# Patient Record
Sex: Female | Born: 1960
Health system: Southern US, Community
[De-identification: ages and names within clinical notes are randomized; demographics above are authoritative.]

## PROBLEM LIST (undated history)

## (undated) DIAGNOSIS — R1310 Dysphagia, unspecified: Secondary | ICD-10-CM

## (undated) DIAGNOSIS — K5900 Constipation, unspecified: Secondary | ICD-10-CM

## (undated) DIAGNOSIS — F419 Anxiety disorder, unspecified: Secondary | ICD-10-CM

## (undated) DIAGNOSIS — T7840XA Allergy, unspecified, initial encounter: Secondary | ICD-10-CM

## (undated) DIAGNOSIS — K219 Gastro-esophageal reflux disease without esophagitis: Secondary | ICD-10-CM

## (undated) DIAGNOSIS — Z923 Personal history of irradiation: Secondary | ICD-10-CM

## (undated) DIAGNOSIS — R7303 Prediabetes: Secondary | ICD-10-CM

## (undated) DIAGNOSIS — E785 Hyperlipidemia, unspecified: Secondary | ICD-10-CM

## (undated) DIAGNOSIS — C801 Malignant (primary) neoplasm, unspecified: Secondary | ICD-10-CM

## (undated) DIAGNOSIS — J189 Pneumonia, unspecified organism: Secondary | ICD-10-CM

## (undated) DIAGNOSIS — M719 Bursopathy, unspecified: Secondary | ICD-10-CM

## (undated) DIAGNOSIS — M549 Dorsalgia, unspecified: Secondary | ICD-10-CM

## (undated) DIAGNOSIS — I00 Rheumatic fever without heart involvement: Secondary | ICD-10-CM

## (undated) DIAGNOSIS — M1990 Unspecified osteoarthritis, unspecified site: Secondary | ICD-10-CM

## (undated) DIAGNOSIS — G4730 Sleep apnea, unspecified: Secondary | ICD-10-CM

## (undated) DIAGNOSIS — C50919 Malignant neoplasm of unspecified site of unspecified female breast: Secondary | ICD-10-CM

## (undated) DIAGNOSIS — M2550 Pain in unspecified joint: Secondary | ICD-10-CM

## (undated) HISTORY — DX: Dysphagia, unspecified: R13.10

## (undated) HISTORY — DX: Personal history of irradiation: Z92.3

## (undated) HISTORY — DX: Bursopathy, unspecified: M71.9

## (undated) HISTORY — PX: ENDOMETRIAL ABLATION: SHX621

## (undated) HISTORY — DX: Rheumatic fever without heart involvement: I00

## (undated) HISTORY — DX: Pain in unspecified joint: M25.50

## (undated) HISTORY — PX: COLONOSCOPY: SHX174

## (undated) HISTORY — DX: Malignant (primary) neoplasm, unspecified: C80.1

## (undated) HISTORY — DX: Allergy, unspecified, initial encounter: T78.40XA

## (undated) HISTORY — PX: TUBAL LIGATION: SHX77

## (undated) HISTORY — PX: POLYPECTOMY: SHX149

## (undated) HISTORY — DX: Dorsalgia, unspecified: M54.9

## (undated) HISTORY — DX: Unspecified osteoarthritis, unspecified site: M19.90

## (undated) HISTORY — DX: Hyperlipidemia, unspecified: E78.5

## (undated) HISTORY — PX: OTHER SURGICAL HISTORY: SHX169

## (undated) HISTORY — DX: Anxiety disorder, unspecified: F41.9

## (undated) HISTORY — DX: Constipation, unspecified: K59.00

## (undated) HISTORY — DX: Gastro-esophageal reflux disease without esophagitis: K21.9

---

## 1998-10-09 ENCOUNTER — Other Ambulatory Visit: Admission: RE | Admit: 1998-10-09 | Discharge: 1998-10-09 | Payer: Self-pay | Admitting: Obstetrics and Gynecology

## 1998-10-29 ENCOUNTER — Ambulatory Visit (HOSPITAL_COMMUNITY): Admission: RE | Admit: 1998-10-29 | Discharge: 1998-10-29 | Payer: Self-pay | Admitting: Obstetrics and Gynecology

## 1999-12-09 ENCOUNTER — Encounter: Admission: RE | Admit: 1999-12-09 | Discharge: 1999-12-09 | Payer: Self-pay | Admitting: Obstetrics and Gynecology

## 1999-12-09 ENCOUNTER — Encounter: Payer: Self-pay | Admitting: Obstetrics and Gynecology

## 2000-09-26 ENCOUNTER — Other Ambulatory Visit: Admission: RE | Admit: 2000-09-26 | Discharge: 2000-09-26 | Payer: Self-pay | Admitting: Obstetrics and Gynecology

## 2001-04-17 ENCOUNTER — Encounter: Admission: RE | Admit: 2001-04-17 | Discharge: 2001-04-17 | Payer: Self-pay | Admitting: Obstetrics and Gynecology

## 2001-04-17 ENCOUNTER — Encounter: Payer: Self-pay | Admitting: Obstetrics and Gynecology

## 2001-10-26 ENCOUNTER — Other Ambulatory Visit: Admission: RE | Admit: 2001-10-26 | Discharge: 2001-10-26 | Payer: Self-pay | Admitting: Obstetrics and Gynecology

## 2002-06-29 ENCOUNTER — Encounter: Admission: RE | Admit: 2002-06-29 | Discharge: 2002-06-29 | Payer: Self-pay | Admitting: Obstetrics and Gynecology

## 2002-06-29 ENCOUNTER — Encounter: Payer: Self-pay | Admitting: Obstetrics and Gynecology

## 2002-08-16 ENCOUNTER — Encounter: Payer: Self-pay | Admitting: Obstetrics and Gynecology

## 2002-08-16 ENCOUNTER — Encounter: Admission: RE | Admit: 2002-08-16 | Discharge: 2002-08-16 | Payer: Self-pay | Admitting: Obstetrics and Gynecology

## 2002-11-27 ENCOUNTER — Other Ambulatory Visit: Admission: RE | Admit: 2002-11-27 | Discharge: 2002-11-27 | Payer: Self-pay | Admitting: Obstetrics and Gynecology

## 2003-10-21 ENCOUNTER — Encounter: Admission: RE | Admit: 2003-10-21 | Discharge: 2003-10-21 | Payer: Self-pay | Admitting: Obstetrics and Gynecology

## 2003-12-04 ENCOUNTER — Other Ambulatory Visit: Admission: RE | Admit: 2003-12-04 | Discharge: 2003-12-04 | Payer: Self-pay | Admitting: Obstetrics and Gynecology

## 2004-01-31 ENCOUNTER — Encounter: Admission: RE | Admit: 2004-01-31 | Discharge: 2004-01-31 | Payer: Self-pay | Admitting: Obstetrics and Gynecology

## 2004-10-21 ENCOUNTER — Encounter: Admission: RE | Admit: 2004-10-21 | Discharge: 2004-10-21 | Payer: Self-pay | Admitting: Obstetrics and Gynecology

## 2005-03-09 ENCOUNTER — Other Ambulatory Visit: Admission: RE | Admit: 2005-03-09 | Discharge: 2005-03-09 | Payer: Self-pay | Admitting: Obstetrics and Gynecology

## 2005-03-22 ENCOUNTER — Encounter: Admission: RE | Admit: 2005-03-22 | Discharge: 2005-03-22 | Payer: Self-pay | Admitting: Obstetrics and Gynecology

## 2005-12-01 ENCOUNTER — Encounter: Payer: Self-pay | Admitting: Obstetrics and Gynecology

## 2006-03-23 ENCOUNTER — Encounter: Admission: RE | Admit: 2006-03-23 | Discharge: 2006-03-23 | Payer: Self-pay | Admitting: Obstetrics and Gynecology

## 2006-04-08 ENCOUNTER — Encounter: Admission: RE | Admit: 2006-04-08 | Discharge: 2006-04-08 | Payer: Self-pay | Admitting: *Deleted

## 2007-04-21 ENCOUNTER — Encounter: Admission: RE | Admit: 2007-04-21 | Discharge: 2007-04-21 | Payer: Self-pay | Admitting: Obstetrics and Gynecology

## 2007-08-03 HISTORY — PX: CERVICAL SPINE SURGERY: SHX589

## 2007-09-08 ENCOUNTER — Ambulatory Visit (HOSPITAL_COMMUNITY): Admission: RE | Admit: 2007-09-08 | Discharge: 2007-09-09 | Payer: Self-pay | Admitting: Neurological Surgery

## 2007-10-09 ENCOUNTER — Encounter: Admission: RE | Admit: 2007-10-09 | Discharge: 2007-10-09 | Payer: Self-pay | Admitting: Neurological Surgery

## 2007-11-13 ENCOUNTER — Encounter: Admission: RE | Admit: 2007-11-13 | Discharge: 2007-11-13 | Payer: Self-pay | Admitting: Neurological Surgery

## 2008-02-09 ENCOUNTER — Encounter: Admission: RE | Admit: 2008-02-09 | Discharge: 2008-02-09 | Payer: Self-pay | Admitting: Neurological Surgery

## 2008-04-22 ENCOUNTER — Encounter: Admission: RE | Admit: 2008-04-22 | Discharge: 2008-04-22 | Payer: Self-pay | Admitting: Obstetrics and Gynecology

## 2008-09-09 ENCOUNTER — Encounter: Admission: RE | Admit: 2008-09-09 | Discharge: 2008-09-09 | Payer: Self-pay | Admitting: Anesthesiology

## 2009-05-23 ENCOUNTER — Encounter: Admission: RE | Admit: 2009-05-23 | Discharge: 2009-05-23 | Payer: Self-pay | Admitting: Obstetrics and Gynecology

## 2009-09-17 ENCOUNTER — Encounter (INDEPENDENT_AMBULATORY_CARE_PROVIDER_SITE_OTHER): Payer: Self-pay | Admitting: *Deleted

## 2010-05-26 ENCOUNTER — Encounter: Admission: RE | Admit: 2010-05-26 | Discharge: 2010-05-26 | Payer: Self-pay | Admitting: Obstetrics and Gynecology

## 2010-09-03 NOTE — Letter (Signed)
Summary: New Patient letter  [facility]  [address] [place], [place] [address]   Phone: [phone]  Fax: [phone]       09/17/2009 MRN: [id number]  Heather Hayes [address] [place], [place]  [address]  Dear Ms. Sledge,  Welcome to the Gastroenterology Division at [facility].    You are scheduled to see Dr.  Russella Dar on 10-15-09 at 2:30p.m. on the 3rd floor at [facility], [address]  We ask that you try to arrive at our office 15 minutes prior to your appointment time to allow for check-in.  We would like you to complete the enclosed self-administered evaluation form prior to your visit and bring it with you on the day of your appointment.  We will review it with you.  Also, please bring a complete list of all your medications or, if you prefer, bring the medication bottles and we will list them.  Please bring your insurance card so that we may make a copy of it.  If your insurance requires a referral to see a specialist, please bring your referral form from your primary care physician.  Co-payments are due at the time of your visit and may be paid by cash, check or credit card.     Your office visit will consist of a consult with your physician (includes a physical exam), any laboratory testing he/she may order, scheduling of any necessary diagnostic testing (e.g. x-ray, ultrasound, CT-scan), and scheduling of a procedure (e.g. Endoscopy, Colonoscopy) if required.  Please allow enough time on your schedule to allow for any/all of these possibilities.    If you cannot keep your appointment, please call [phone] to cancel or reschedule prior to your appointment date.  This allows [name] the opportunity to schedule an appointment for another patient in need of care.  If you do not cancel or reschedule by 5 p.m. the business day prior to your appointment date, you will be charged a $50.00 late cancellation/no-show fee.    Thank you for choosing [facility] for your medical needs.  We appreciate the opportunity to care for you.  Please visit [name] at our website  to learn more about our practice.                     Sincerely,                                                             The Gastroenterology Division

## 2010-12-15 NOTE — Op Note (Signed)
NAMECONSWELLA, BRUNEY NO.:  [id number]   MEDICAL RECORD NO.:  [id number]          PATIENT TYPE:  OIB   LOCATION:  3599                         FACILITY:  [facility]   PHYSICIAN:  Tia Alert, MD     DATE OF BIRTH:  01-07-1961   DATE OF PROCEDURE:  09/08/2007  DATE OF DISCHARGE:                               OPERATIVE REPORT   PREOPERATIVE DIAGNOSIS:  Center cervical disk herniation C6-7 on the  left with left C7 radiculopathy.   POSTOPERATIVE DIAGNOSIS:  Center cervical disk herniation C6-7 on the  left with left C7 radiculopathy.   PROCEDURES:  1. Decompressive anterior cervical diskectomy C6-7.  2. Anterior cervical arthrodesis C6-7 utilizing a 7 mm      corticocancellous allograft.  3. Anterior cervical plating C6-7 utilizing 24 mm Nuvasive plate.   SURGEON:  Marikay Alar, M.D.   ASSISTANT:  Dr. Altamease Oiler.   ANESTHESIA:  General tracheal.   COMPLICATIONS:  None apparent.   INDICATIONS FOR PROCEDURE:  Ms. Quesenberry was a 50 year old female who is  referred with neck and severe left arm pain.  She had MRI which showed a  herniated disk at C6-7 on the left with severe left foraminal narrowing.  She tried medical management for quite some time without significant  relief.  I recommended anterior cervical diskectomy with fusion plating  C6-7.  She understood the risks, benefits, expected outcome and wished  to proceed.   DESCRIPTION OF PROCEDURE:  The patient was taken to operating room and  after induction of adequate generalized endotracheal anesthesia she was  placed the supine position on the operating room table with her right  anterior cervical region expose it was prepped with DuraPrep and draped  in usual sterile fashion.  5 mL local anesthesia was injected and a  transverse incision was made to the right of midline and carried down to  the platysma.  Platysma which was elevated, opened and undermined with  Metzenbaum scissors and then dissected a  plane medial to the  sternocleidomastoid muscle and internal carotid artery and lateral to  the trachea and esophagus to expose C6-7.  Intraoperative fluoroscopy  confirmed my level and then the longus colli muscles were taken down and  shadow line retractors were placed under this.  The annulus at C6-7 was  incised and initial diskectomy was done with pituitary rongeurs and  curved Karlin curettes.  The high-speed drill was then used to drill the  endplates to prepare for later arthrodesis.  I drilled down to the level  of posterior longitudinal ligament.  The operating microscope was  brought to the field, the ligament was then opened and removed in  circumferential fashion while undercutting the bodies of C6 and C7.  A  large disk herniation was removed from the foramen at C6 on the left.  The nerve root was identified and we marched along the nerve root into  the foramen until a significant portion of nerve root was visualized and  decompressed.  We then palpated with a nerve hook into the foramen to  assure adequate decompression, found  no more compression of C7 nerve  root, palpated circumferentially to assure no compression in the midline  on the right side, then irrigated with saline solution, measured  interspace to be 7 mm and used a 7-mm corticocancellous allograft and  tapped this into position at C6-7, then used a 24 mm Nuvasive plate and  placed two 13 mm variable angle screws in the bodies of C6-C7 and these  were locked into the plate by locking mechanism within the plate.  We  then dried all bleeding points bipolar cautery and with Surgifoam and  Gelfoam and then irrigated again with saline solution containing  bacitracin and once meticulous hemostasis was achieved, closed the  platysma with 3-0 Vicryl, closed subcuticular tissue with 3-0 Vicryl,  closed skin with Benzoin Steri-Strips.  The drapes removed.  Sterile  dressing was applied.  The patient awakened from general  anesthesia and  transferred to recovery room in stable condition.  At the end of  procedure all sponge, needle, instrument counts were correct.      Tia Alert, MD  Electronically Signed     DSJ/MEDQ  D:  09/08/2007  T:  09/11/2007  Job:  [id number]

## 2011-04-23 LAB — CBC
Hemoglobin: 12.2
RBC: 4.1
RDW: 13.8

## 2011-04-23 LAB — DIFFERENTIAL
Lymphs Abs: 1.8
Monocytes Absolute: 0.4
Monocytes Relative: 7
Neutro Abs: 3.9
Neutrophils Relative %: 61

## 2011-04-23 LAB — BASIC METABOLIC PANEL
BUN: 9
GFR calc Af Amer: >60
GFR calc non Af Amer: >60
Potassium: 3.9
Sodium: 136

## 2014-11-12 ENCOUNTER — Other Ambulatory Visit: Payer: Self-pay | Admitting: Family Medicine

## 2014-11-12 DIAGNOSIS — R1011 Right upper quadrant pain: Secondary | ICD-10-CM

## 2014-11-13 ENCOUNTER — Ambulatory Visit
Admission: RE | Admit: 2014-11-13 | Discharge: 2014-11-13 | Disposition: A | Discharge status: Home / Self Care | Payer: BLUE CROSS/BLUE SHIELD | Source: Ambulatory Visit | Attending: Family Medicine | Admitting: Family Medicine

## 2014-11-13 DIAGNOSIS — R1011 Right upper quadrant pain: Secondary | ICD-10-CM

## 2015-08-18 ENCOUNTER — Other Ambulatory Visit: Payer: Self-pay | Admitting: Family Medicine

## 2015-08-18 DIAGNOSIS — M545 Low back pain: Secondary | ICD-10-CM

## 2015-08-23 ENCOUNTER — Ambulatory Visit
Admission: RE | Admit: 2015-08-23 | Discharge: 2015-08-23 | Disposition: A | Discharge status: Home / Self Care | Payer: BLUE CROSS/BLUE SHIELD | Source: Ambulatory Visit | Attending: Family Medicine | Admitting: Family Medicine

## 2015-08-23 DIAGNOSIS — M545 Low back pain: Secondary | ICD-10-CM

## 2017-01-26 ENCOUNTER — Other Ambulatory Visit: Payer: Self-pay | Admitting: Obstetrics and Gynecology

## 2017-01-26 DIAGNOSIS — N63 Unspecified lump in unspecified breast: Secondary | ICD-10-CM

## 2017-01-27 ENCOUNTER — Ambulatory Visit
Admission: RE | Admit: 2017-01-27 | Discharge: 2017-01-27 | Disposition: A | Discharge status: Home / Self Care | Payer: BLUE CROSS/BLUE SHIELD | Source: Ambulatory Visit | Attending: Obstetrics and Gynecology | Admitting: Obstetrics and Gynecology

## 2017-01-27 DIAGNOSIS — N63 Unspecified lump in unspecified breast: Secondary | ICD-10-CM

## 2017-08-17 ENCOUNTER — Other Ambulatory Visit: Payer: Self-pay | Admitting: Obstetrics and Gynecology

## 2017-08-17 DIAGNOSIS — Z1231 Encounter for screening mammogram for malignant neoplasm of breast: Secondary | ICD-10-CM

## 2017-09-05 DIAGNOSIS — M545 Low back pain: Secondary | ICD-10-CM | POA: Diagnosis not present

## 2017-09-08 ENCOUNTER — Ambulatory Visit
Admission: RE | Admit: 2017-09-08 | Discharge: 2017-09-08 | Disposition: A | Discharge status: Home / Self Care | Payer: BLUE CROSS/BLUE SHIELD | Source: Ambulatory Visit | Attending: Obstetrics and Gynecology | Admitting: Obstetrics and Gynecology

## 2017-09-08 DIAGNOSIS — Z1231 Encounter for screening mammogram for malignant neoplasm of breast: Secondary | ICD-10-CM

## 2017-09-08 DIAGNOSIS — Z01419 Encounter for gynecological examination (general) (routine) without abnormal findings: Secondary | ICD-10-CM | POA: Diagnosis not present

## 2017-09-08 DIAGNOSIS — Z0000 Encounter for general adult medical examination without abnormal findings: Secondary | ICD-10-CM | POA: Diagnosis not present

## 2017-09-08 DIAGNOSIS — E7800 Pure hypercholesterolemia, unspecified: Secondary | ICD-10-CM | POA: Diagnosis not present

## 2017-09-09 DIAGNOSIS — Z86010 Personal history of colonic polyps: Secondary | ICD-10-CM | POA: Diagnosis not present

## 2017-09-09 DIAGNOSIS — Z803 Family history of malignant neoplasm of breast: Secondary | ICD-10-CM | POA: Diagnosis not present

## 2017-09-09 DIAGNOSIS — Z8041 Family history of malignant neoplasm of ovary: Secondary | ICD-10-CM | POA: Diagnosis not present

## 2017-10-25 DIAGNOSIS — Z809 Family history of malignant neoplasm, unspecified: Secondary | ICD-10-CM | POA: Diagnosis not present

## 2017-11-07 DIAGNOSIS — S39012A Strain of muscle, fascia and tendon of lower back, initial encounter: Secondary | ICD-10-CM | POA: Diagnosis not present

## 2018-09-11 DIAGNOSIS — Z0000 Encounter for general adult medical examination without abnormal findings: Secondary | ICD-10-CM | POA: Diagnosis not present

## 2018-09-11 DIAGNOSIS — Z23 Encounter for immunization: Secondary | ICD-10-CM | POA: Diagnosis not present

## 2018-09-18 DIAGNOSIS — Z01419 Encounter for gynecological examination (general) (routine) without abnormal findings: Secondary | ICD-10-CM | POA: Diagnosis not present

## 2018-09-18 DIAGNOSIS — F419 Anxiety disorder, unspecified: Secondary | ICD-10-CM | POA: Insufficient documentation

## 2018-09-18 DIAGNOSIS — M1990 Unspecified osteoarthritis, unspecified site: Secondary | ICD-10-CM | POA: Insufficient documentation

## 2018-09-18 DIAGNOSIS — Z6834 Body mass index (BMI) 34.0-34.9, adult: Secondary | ICD-10-CM | POA: Diagnosis not present

## 2018-09-21 ENCOUNTER — Other Ambulatory Visit: Payer: Self-pay | Admitting: Obstetrics and Gynecology

## 2018-09-21 DIAGNOSIS — R928 Other abnormal and inconclusive findings on diagnostic imaging of breast: Secondary | ICD-10-CM

## 2018-09-27 ENCOUNTER — Ambulatory Visit
Admission: RE | Admit: 2018-09-27 | Discharge: 2018-09-27 | Disposition: A | Discharge status: Home / Self Care | Payer: 59 | Source: Ambulatory Visit | Attending: Obstetrics and Gynecology | Admitting: Obstetrics and Gynecology

## 2018-09-27 ENCOUNTER — Other Ambulatory Visit: Payer: Self-pay | Admitting: Obstetrics and Gynecology

## 2018-09-27 DIAGNOSIS — R928 Other abnormal and inconclusive findings on diagnostic imaging of breast: Secondary | ICD-10-CM

## 2018-09-27 DIAGNOSIS — N6489 Other specified disorders of breast: Secondary | ICD-10-CM | POA: Diagnosis not present

## 2018-09-27 DIAGNOSIS — R922 Inconclusive mammogram: Secondary | ICD-10-CM | POA: Diagnosis not present

## 2018-09-27 DIAGNOSIS — N6310 Unspecified lump in the right breast, unspecified quadrant: Secondary | ICD-10-CM

## 2018-10-02 ENCOUNTER — Ambulatory Visit
Admission: RE | Admit: 2018-10-02 | Discharge: 2018-10-02 | Disposition: A | Discharge status: Home / Self Care | Payer: 59 | Source: Ambulatory Visit | Attending: Obstetrics and Gynecology | Admitting: Obstetrics and Gynecology

## 2018-10-02 DIAGNOSIS — N6312 Unspecified lump in the right breast, upper inner quadrant: Secondary | ICD-10-CM | POA: Diagnosis not present

## 2018-10-02 DIAGNOSIS — N6310 Unspecified lump in the right breast, unspecified quadrant: Secondary | ICD-10-CM

## 2018-10-02 DIAGNOSIS — C50811 Malignant neoplasm of overlapping sites of right female breast: Secondary | ICD-10-CM | POA: Diagnosis not present

## 2018-10-02 DIAGNOSIS — N6311 Unspecified lump in the right breast, upper outer quadrant: Secondary | ICD-10-CM | POA: Diagnosis not present

## 2018-10-04 ENCOUNTER — Other Ambulatory Visit: Payer: Self-pay | Admitting: *Deleted

## 2018-10-04 ENCOUNTER — Encounter: Payer: Self-pay | Admitting: *Deleted

## 2018-10-04 DIAGNOSIS — Z170 Estrogen receptor positive status [ER+]: Secondary | ICD-10-CM

## 2018-10-04 DIAGNOSIS — C50411 Malignant neoplasm of upper-outer quadrant of right female breast: Secondary | ICD-10-CM

## 2018-10-11 ENCOUNTER — Ambulatory Visit: Payer: 59 | Attending: General Surgery | Admitting: Physical Therapy

## 2018-10-11 ENCOUNTER — Telehealth: Payer: Self-pay | Admitting: Hematology

## 2018-10-11 ENCOUNTER — Encounter: Payer: Self-pay | Admitting: Hematology

## 2018-10-11 ENCOUNTER — Ambulatory Visit
Admission: RE | Admit: 2018-10-11 | Discharge: 2018-10-11 | Disposition: A | Discharge status: Home / Self Care | Payer: 59 | Source: Ambulatory Visit | Attending: Radiation Oncology | Admitting: Radiation Oncology

## 2018-10-11 ENCOUNTER — Inpatient Hospital Stay (HOSPITAL_BASED_OUTPATIENT_CLINIC_OR_DEPARTMENT_OTHER): Payer: 59 | Admitting: Hematology

## 2018-10-11 ENCOUNTER — Other Ambulatory Visit: Payer: Self-pay

## 2018-10-11 ENCOUNTER — Encounter: Payer: Self-pay | Admitting: Physical Therapy

## 2018-10-11 ENCOUNTER — Ambulatory Visit: Payer: Self-pay | Admitting: General Surgery

## 2018-10-11 ENCOUNTER — Inpatient Hospital Stay: Payer: 59

## 2018-10-11 VITALS — BP 138/68 | HR 64 | Temp 98.6°F | Resp 18 | Ht 63.0 in | Wt 192.3 lb

## 2018-10-11 DIAGNOSIS — C50912 Malignant neoplasm of unspecified site of left female breast: Secondary | ICD-10-CM | POA: Diagnosis not present

## 2018-10-11 DIAGNOSIS — C50411 Malignant neoplasm of upper-outer quadrant of right female breast: Secondary | ICD-10-CM | POA: Insufficient documentation

## 2018-10-11 DIAGNOSIS — R293 Abnormal posture: Secondary | ICD-10-CM | POA: Insufficient documentation

## 2018-10-11 DIAGNOSIS — F419 Anxiety disorder, unspecified: Secondary | ICD-10-CM | POA: Insufficient documentation

## 2018-10-11 DIAGNOSIS — Z170 Estrogen receptor positive status [ER+]: Secondary | ICD-10-CM | POA: Insufficient documentation

## 2018-10-11 DIAGNOSIS — C50911 Malignant neoplasm of unspecified site of right female breast: Secondary | ICD-10-CM

## 2018-10-11 DIAGNOSIS — Z8041 Family history of malignant neoplasm of ovary: Secondary | ICD-10-CM

## 2018-10-11 DIAGNOSIS — Z803 Family history of malignant neoplasm of breast: Secondary | ICD-10-CM | POA: Diagnosis not present

## 2018-10-11 DIAGNOSIS — E7800 Pure hypercholesterolemia, unspecified: Secondary | ICD-10-CM | POA: Insufficient documentation

## 2018-10-11 LAB — CBC WITH DIFFERENTIAL (CANCER CENTER ONLY)
Abs Immature Granulocytes: 0.02 10*3/uL (ref 0.00–0.07)
BASOS PCT: 1 %
Basophils Absolute: 0.1 10*3/uL (ref 0.0–0.1)
EOS ABS: 0.2 10*3/uL (ref 0.0–0.5)
Eosinophils Relative: 3 %
HCT: 44.5 % (ref 36.0–46.0)
Hemoglobin: 14.2 g/dL (ref 12.0–15.0)
IMMATURE GRANULOCYTES: 0 %
Lymphocytes Relative: 37 %
Lymphs Abs: 2.5 10*3/uL (ref 0.7–4.0)
MCH: 30.4 pg (ref 26.0–34.0)
MCHC: 31.9 g/dL (ref 30.0–36.0)
MCV: 95.3 fL (ref 80.0–100.0)
MONOS PCT: 5 %
Monocytes Absolute: 0.4 10*3/uL (ref 0.1–1.0)
Neutro Abs: 3.6 10*3/uL (ref 1.7–7.7)
Neutrophils Relative %: 54 %
PLATELETS: 267 10*3/uL (ref 150–400)
RBC: 4.67 MIL/uL (ref 3.87–5.11)
RDW: 12.3 % (ref 11.5–15.5)
WBC: 6.7 10*3/uL (ref 4.0–10.5)
nRBC: 0 % (ref 0.0–0.2)

## 2018-10-11 LAB — CMP (CANCER CENTER ONLY)
ALBUMIN: 4.2 g/dL (ref 3.5–5.0)
ALK PHOS: 95 U/L (ref 38–126)
ALT: 11 U/L (ref 0–44)
AST: 15 U/L (ref 15–41)
Anion gap: 11 (ref 5–15)
BILIRUBIN TOTAL: 0.7 mg/dL (ref 0.3–1.2)
BUN: 18 mg/dL (ref 6–20)
CALCIUM: 10.1 mg/dL (ref 8.9–10.3)
CO2: 26 mmol/L (ref 22–32)
Chloride: 105 mmol/L (ref 98–111)
Creatinine: 0.81 mg/dL (ref 0.44–1.00)
GLUCOSE: 75 mg/dL (ref 70–99)
Potassium: 4.3 mmol/L (ref 3.5–5.1)
Sodium: 142 mmol/L (ref 135–145)
TOTAL PROTEIN: 8.5 g/dL — AB (ref 6.5–8.1)

## 2018-10-11 NOTE — Patient Instructions (Signed)

## 2018-10-11 NOTE — Progress Notes (Signed)
Radiation Oncology         [phone] ________________________________  Multidisciplinary Breast Oncology Clinic [facility]) Initial Outpatient Consultation  Name: Heather Hayes MRN: [id number]  Date: 10/11/2018  DOB: April 24, 1961  ZC:HYIFOYD, Heather Lovett, MD  Excell Seltzer, MD   REFERRING PHYSICIAN: Excell Seltzer, MD  DIAGNOSIS: The encounter diagnosis was Malignant neoplasm of upper-outer quadrant of right breast in female, estrogen receptor positive ([place]).  Stage (T1b, Nx, Mx) right Breast UOQ Invasive Ductal Carcinoma, ER+ / PR+ / Her2-, Grade 1-2.    ICD-10-CM   1. Malignant neoplasm of upper-outer quadrant of right breast in female, estrogen receptor positive ([place]) C50.411    Z17.0     HISTORY OF PRESENT ILLNESS::Heather Hayes is a 58 y.o. female who is presenting to the office today for evaluation of her newly diagnosed breast cancer. She is accompanied by her husband. She is doing well overall.   She had routine screening mammography on 09/18/18 showing a possible abnormality in the right breast. She underwent bilateral diagnostic mammography with tomography and right breast ultrasonography at [facility] on 09/27/18 showing: suspicious right breast mass corresponding with the screening mammographic findings (7 mm). No suspicious right axillary lymphadenopathy..  Biopsy on 10/02/18 showed: invasive ductal carcinoma at 10 o'clock. Prognostic indicators significant for: estrogen receptor, 95% positive and progesterone receptor, 95% positive, both with strong staining intensity. Proliferation marker Ki67 at 5%. HER2 negative.  Menarche: 58 years old Age at first live birth: 58 years old GP: GxP3 LMP: 2009 Contraceptive: Yes, oral, age 15-46 HRT: Yes, several years   The patient was referred today for presentation in the multidisciplinary conference.  Radiology studies and pathology slides were presented there for review and discussion of treatment options.  A consensus  was discussed regarding potential next steps.  PREVIOUS RADIATION THERAPY: No  PAST MEDICAL HISTORY:  has a past medical history of Anxiety and Arthritis.    PAST SURGICAL HISTORY: Past Surgical History:  Procedure Laterality Date   CERVICAL SPINE SURGERY  2009   TUBAL LIGATION     tube ligation       FAMILY HISTORY: family history includes Breast cancer (age of onset: 9) in her mother; Ovarian cancer in her maternal grandmother.  SOCIAL HISTORY:  reports that she has never smoked. She has never used smokeless tobacco. She reports current alcohol use of about 1.0 - 2.0 standard drinks of alcohol per week. She reports that she does not use drugs.  ALLERGIES: Compazine [prochlorperazine edisylate]  MEDICATIONS:  Current Outpatient Medications  Medication Sig Dispense Refill   clonazePAM (KLONOPIN) 0.5 MG tablet Take 0.5 mg by mouth 2 (two) times daily as needed for anxiety.     estradiol (EVAMIST) 1.53 MG/SPRAY transdermal spray Place 1 spray onto the skin daily.     meloxicam (MOBIC) 15 MG tablet Take 15 mg by mouth daily.     progesterone (ENDOMETRIN) 100 MG vaginal insert Place 100 mg vaginally 2 (two) times daily.     No current facility-administered medications for this encounter.     REVIEW OF SYSTEMS:  REVIEW OF SYSTEMS: A 10+ POINT REVIEW OF SYSTEMS WAS OBTAINED including neurology, dermatology, psychiatry, cardiac, respiratory, lymph, extremities, GI, GU, musculoskeletal, constitutional, reproductive, HEENT. On the provided form, she reports fatigue, wears glasses, ringing in ears, sinus problems, nose bleeds, poor appetite, dribbling urine, breast pain following her biopsy, skin rash, arthritis, and anxiety. She denies any other symptoms.    PHYSICAL EXAM:  Vitals with BMI 10/11/2018  Height  _0   Weight 192 lbs 5 oz  BMI 26.94  Systolic 854  Diastolic 68  Pulse 64  Respirations 18   Lungs are clear to auscultation bilaterally. Heart has regular rate and  rhythm. No palpable cervical, supraclavicular, or axillary adenopathy. Abdomen soft, non-tender, normal bowel sounds. Breast: Left breast with no palpable mass, nipple discharge, or bleeding. Right breast with minor bruising in the UOQ. No palpable mass, nipple discharge or bleeding.   ECOG = 0  0 - Asymptomatic (Fully active, able to carry on all predisease activities without restriction)  1 - Symptomatic but completely ambulatory (Restricted in physically strenuous activity but ambulatory and able to carry out work of a light or sedentary nature. For example, light housework, office work)  2 - Symptomatic, <50% in bed during the day (Ambulatory and capable of all self care but unable to carry out any work activities. Up and about more than 50% of waking hours)  3 - Symptomatic, >50% in bed, but not bedbound (Capable of only limited self-care, confined to bed or chair 50% or more of waking hours)  4 - Bedbound (Completely disabled. Cannot carry on any self-care. Totally confined to bed or chair)  5 - Death   Eustace Pen MM, Creech RH, Tormey DC, et al. [phone]). "Toxicity and response criteria of the [facility]". [place] Oncol. 5 (6): 649-55  LABORATORY DATA:  Lab Results  Component Value Date   WBC 6.7 10/11/2018   HGB 14.2 10/11/2018   HCT 44.5 10/11/2018   MCV 95.3 10/11/2018   PLT 267 10/11/2018   Lab Results  Component Value Date   NA 142 10/11/2018   K 4.3 10/11/2018   CL 105 10/11/2018   CO2 26 10/11/2018   Lab Results  Component Value Date   ALT 11 10/11/2018   AST 15 10/11/2018   ALKPHOS 95 10/11/2018   BILITOT 0.7 10/11/2018    PULMONARY FUNCTION TEST:   Recent Review Flowsheet Data    There is no flowsheet data to display.      RADIOGRAPHY: US Breast Ltd Uni Right Inc Axilla  Result Date: 09/27/2018 CLINICAL DATA:  58 year old female recalled from screening mammogram dated 09/18/2018 for possible right breast mass. EXAM: DIGITAL  DIAGNOSTIC RIGHT MAMMOGRAM WITH CAD AND TOMO ULTRASOUND RIGHT BREAST COMPARISON:  Previous exam(s). ACR Breast Density Category c: The breast tissue is heterogeneously dense, which may obscure small masses. FINDINGS: There is a persistent round, spiculated mass in the superior central right breast at far posterior depth. Further evaluation with ultrasound was performed. Mammographic images were processed with CAD. Targeted ultrasound is performed, showing an irregular hypoechoic mass with associated vascularity at the 12 o'clock position 7 cm from the nipple. It measures 7 x 7 x 6 mm. This correlates with the mammographic finding. Evaluation of the right axilla demonstrates no suspicious lymphadenopathy. IMPRESSION: 1. Suspicious right breast mass corresponding with the screening mammographic findings. Recommendation is for ultrasound-guided biopsy. 2. No suspicious right axillary lymphadenopathy. RECOMMENDATION: 1. Ultrasound-guided biopsy of the right breast. 2. Given the patient's breast density, further evaluation with contrast enhanced breast MRI should be considered pending biopsy results. I have discussed the findings and recommendations with the patient. Results were also provided in writing at the conclusion of the visit. If applicable, a reminder letter will be sent to the patient regarding the next appointment. BI-RADS CATEGORY  4: Suspicious. Electronically Signed   By: Kristopher Oppenheim M.D.   On: 09/27/2018 15:46  Mm Diag Breast Tomo Uni Right  Result Date: 09/27/2018 CLINICAL DATA:  58 year old female recalled from screening mammogram dated 09/18/2018 for possible right breast mass. EXAM: DIGITAL DIAGNOSTIC RIGHT MAMMOGRAM WITH CAD AND TOMO ULTRASOUND RIGHT BREAST COMPARISON:  Previous exam(s). ACR Breast Density Category c: The breast tissue is heterogeneously dense, which may obscure small masses. FINDINGS: There is a persistent round, spiculated mass in the superior central right breast at far  posterior depth. Further evaluation with ultrasound was performed. Mammographic images were processed with CAD. Targeted ultrasound is performed, showing an irregular hypoechoic mass with associated vascularity at the 12 o'clock position 7 cm from the nipple. It measures 7 x 7 x 6 mm. This correlates with the mammographic finding. Evaluation of the right axilla demonstrates no suspicious lymphadenopathy. IMPRESSION: 1. Suspicious right breast mass corresponding with the screening mammographic findings. Recommendation is for ultrasound-guided biopsy. 2. No suspicious right axillary lymphadenopathy. RECOMMENDATION: 1. Ultrasound-guided biopsy of the right breast. 2. Given the patient's breast density, further evaluation with contrast enhanced breast MRI should be considered pending biopsy results. I have discussed the findings and recommendations with the patient. Results were also provided in writing at the conclusion of the visit. If applicable, a reminder letter will be sent to the patient regarding the next appointment. BI-RADS CATEGORY  4: Suspicious. Electronically Signed   By: Kristopher Oppenheim M.D.   On: 09/27/2018 15:46   Mm Clip Placement Right  Result Date: 10/02/2018 CLINICAL DATA:  Status post ultrasound-guided core biopsy of the mass in the 12 o'clock region of the right breast. EXAM: DIAGNOSTIC RIGHT MAMMOGRAM POST ULTRASOUND BIOPSY COMPARISON:  Previous exam(s). FINDINGS: Mammographic images were obtained following ultrasound guided biopsy of a mass in the 12 o'clock region of the right breast. Mammographic images showed there is a ribbon shaped clip directly adjacent (inferior and posterior) to the mass in the 12 o'clock region of the breast. IMPRESSION: Status post ultrasound-guided core biopsy of a right breast mass with pathology pending. Final Assessment: Post Procedure Mammograms for Marker Placement Electronically Signed   By: Lillia Mountain M.D.   On: 10/02/2018 14:24   [name] Rt Breast Bx W Loc Dev  1st Lesion Img Bx Spec US Guide  Addendum Date: 10/04/2018   ADDENDUM REPORT: 10/03/2018 14:52 ADDENDUM: Pathology revealed GRADE I-II INVASIVE DUCTAL CARCINOMA of the Right breast, 12 o'clock. This was found to be concordant by Dr. Lillia Mountain. Pathology results were discussed with the patient by telephone. The patient reported doing well after the biopsy with tenderness at the site. Post biopsy instructions and care were reviewed and questions were answered. The patient was encouraged to call [facility] for any additional concerns. The patient was referred to [facility] on October 11, 2018. Pathology results reported by Terie Purser, RN on 10/03/2018. Electronically Signed   By: Lillia Mountain M.D.   On: 10/03/2018 14:52   Result Date: 10/04/2018 CLINICAL DATA:  Right breast mass. EXAM: ULTRASOUND GUIDED RIGHT BREAST CORE NEEDLE BIOPSY COMPARISON:  Previous exam(s). FINDINGS: I met with the patient and we discussed the procedure of ultrasound-guided biopsy, including benefits and alternatives. We discussed the high likelihood of a successful procedure. We discussed the risks of the procedure, including infection, bleeding, tissue injury, clip migration, and inadequate sampling. Informed written consent was given. The usual time-out protocol was performed immediately prior to the procedure. Lesion quadrant: 12 o'clock Using sterile technique and 1% lidocaine  and 1% lidocaine with epinephrine as local anesthetic, under direct ultrasound visualization, a 14 gauge spring-loaded device was used to perform biopsy of a mass in the 12 o'clock region of the right breast using a lateral to me approach. A ribbon shaped tissue marker clip was deployed into the biopsy cavity. Follow up 2 view mammogram was performed and dictated separately. IMPRESSION: Ultrasound guided biopsy of the right breast. No apparent complications.  Electronically Signed: By: Lillia Mountain M.D. On: 10/02/2018 14:12      IMPRESSION: Stage I (T1b, Nx, Mx) right Breast UOQ Invasive Ductal Carcinoma, ER+ / PR+ / Her2-, Grade 1-2.   Patient will be a good candidate for breast conservation with radiotherapy to right breast. We discussed the general course of radiation, potential side effects, and toxicities with radiation and the patient is interested in this approach. She lives in [place] and may consider radiation treatments at the [facility]. She will plan on seeing me 2- 3 weeks after her surgery so that she can determine which treatment location would be best as it relates to her work schedule and residence.   PLAN:  1. Right lumpectomy SLN 2. Oncotype?  3. XRT 4. AI   ------------------------------------------------  Blair Promise, PhD, MD This document serves as a record of services personally performed by Gery Pray, MD. It was created on his behalf by Mary-Margaret Loma Messing, a trained medical scribe. The creation of this record is based on the scribe's personal observations and the provider's statements to them. This document has been checked and approved by the attending provider.

## 2018-10-11 NOTE — Progress Notes (Unsigned)
Nutrition Assessment  Reason for Assessment:  Pt seen in Breast Clinic  ASSESSMENT:  58 year old female with new diagnosis of breast cancer.  Past medical history reviewed.    Patient reports normal appetite.  Medications:  reviewed  Labs: reviewed  Anthropometrics:   Height: 63 inches Weight: 192 lb 4.8 oz BMI: 34   NUTRITION DIAGNOSIS: Food and nutrition related knowledge deficit related to new diagnosis of breast cancer as evidenced by no prior need for nutrition related information.  INTERVENTION:   Discussed and provided packet of information regarding nutritional tips for breast cancer patients.  Questions answered.  Teachback method used.  Contact information provided and patient knows to contact me with questions/concerns.    MONITORING, EVALUATION, and GOAL: Pt will consume a healthy plant based diet to maintain lean body mass throughout treatment.   Heather Hayes, [place], [place] Registered Dietitian [phone] (pager)

## 2018-10-11 NOTE — Therapy (Signed)
[place], [place], [address] Phone: [phone]   Fax:  [phone]  Physical Therapy Evaluation  Patient Details  Name: Heather Hayes MRN: [id number] Date of Birth: Nov 03, 1960 Referring Provider (PT): Dr. Excell Seltzer   Encounter Date: 10/11/2018  PT End of Session - 10/11/18 1517    Visit Number  1    Number of Visits  2    Date for PT Re-Evaluation  12/06/18    PT Start Time  1330    PT Stop Time  1353    PT Time Calculation (min)  23 min    Activity Tolerance  Patient tolerated treatment well    Behavior During Therapy  [facility] for tasks assessed/performed       Past Medical History:  Diagnosis Date  . Anxiety   . Arthritis     Past Surgical History:  Procedure Laterality Date  . CERVICAL SPINE SURGERY  2009  . TUBAL LIGATION    . tube ligation       There were no vitals filed for this visit.   Subjective Assessment - 10/11/18 1502    Subjective  Patient reports she is here today to be seen by her medical team for her newly diagnosed right breast cancer.    Patient is accompained by:  Family member    Pertinent History  Patient was diagnosed on 09/18/2018 with right grade I-II invasive ductal carcinoma breast cancer. It measures 7 mm and is locted in the upper outer quadrant. It is ER/PR positive and HER2 negative with a Ki67 of 5%. She had a cervical fusion in 2009.     Patient Stated Goals  Reduce lymphedema risk and learn post op shoulder ROM HEP    Currently in Pain?  No/denies         [facility] PT Assessment - 10/11/18 0001      Assessment   Medical Diagnosis  Right breast cancer    Referring Provider (PT)  Dr. Excell Seltzer    Onset Date/Surgical Date  09/18/18    Hand Dominance  Right    Prior Therapy  none      Precautions   Precautions  Other (comment)    Precaution Comments  active cancer      Restrictions   Weight Bearing Restrictions  No      Balance  Screen   Has the patient fallen in the past 6 months  No    Has the patient had a decrease in activity level because of a fear of falling?   No    Is the patient reluctant to leave their home because of a fear of falling?   No      Home Environment   Living Environment  Private residence    Living Arrangements  Spouse/significant other;Children   Husband and 42 y.o. daughter   Available Help at Discharge  Family      Prior Function   Level of Independence  Independent    Vocation  Full time employment    Licensed conveyancer    Leisure  She does not exercise      Cognition   Overall Cognitive Status  Within Functional Limits for tasks assessed      Posture/Postural Control   Posture/Postural Control  Postural limitations    Postural Limitations  Rounded Shoulders;Forward head      ROM / Strength   AROM / PROM / Strength  AROM;Strength  AROM   AROM Assessment Site  Shoulder;Cervical    Right/Left Shoulder  Right;Left    Right Shoulder Extension  50 Degrees    Right Shoulder Flexion  153 Degrees    Right Shoulder ABduction  164 Degrees    Right Shoulder Internal Rotation  60 Degrees    Right Shoulder External Rotation  82 Degrees    Left Shoulder Extension  49 Degrees    Left Shoulder Flexion  150 Degrees    Left Shoulder ABduction  167 Degrees    Left Shoulder Internal Rotation  60 Degrees    Left Shoulder External Rotation  90 Degrees    Cervical Flexion  WNL    Cervical Extension  WNL    Cervical - Right Side Bend  25% limited    Cervical - Left Side Bend  25% limited    Cervical - Right Rotation  25% limited    Cervical - Left Rotation  25% limited      Strength   Overall Strength  Within functional limits for tasks performed        LYMPHEDEMA/ONCOLOGY QUESTIONNAIRE - 10/11/18 1506      Type   Cancer Type  Right breast cancer      Lymphedema Assessments   Lymphedema Assessments  Upper extremities      Right Upper Extremity  Lymphedema   10 cm Proximal to Olecranon Process  31.1 cm    Olecranon Process  27 cm    10 cm Proximal to Ulnar Styloid Process  23.5 cm    Just Proximal to Ulnar Styloid Process  16.3 cm    Across Hand at [employer]  19.5 cm    At [place] of 2nd Digit  6.8 cm      Left Upper Extremity Lymphedema   10 cm Proximal to Olecranon Process  30.5 cm    Olecranon Process  25.9 cm    10 cm Proximal to Ulnar Styloid Process  23.6 cm    Just Proximal to Ulnar Styloid Process  16.9 cm    Across Hand at [employer]  19.3 cm    At [place] of 2nd Digit  6.7 cm          Quick Dash - 10/11/18 0001    Open a tight or new jar  Mild difficulty    Do heavy household chores (wash walls, wash floors)  Mild difficulty    Carry a shopping bag or briefcase  Mild difficulty    Wash your back  Mild difficulty    Use a knife to cut food  No difficulty    Recreational activities in which you take some force or impact through your arm, shoulder, or hand (golf, hammering, tennis)  Mild difficulty    During the past week, to what extent has your arm, shoulder or hand problem interfered with your normal social activities with family, friends, neighbors, or groups?  Not at all    During the past week, to what extent has your arm, shoulder or hand problem limited your work or other regular daily activities  Not at all    Arm, shoulder, or hand pain.  None    Tingling (pins and needles) in your arm, shoulder, or hand  None    Difficulty Sleeping  No difficulty    DASH Score  11.36 %        Objective measurements completed on examination: See above findings.       Patient was instructed  today in a home exercise program today for post op shoulder range of motion. These included active assist shoulder flexion in sitting, scapular retraction, wall walking with shoulder abduction, and hands behind head external rotation.  She was encouraged to do these twice a day, holding 3 seconds and repeating 5 times when  permitted by her physician.           PT Education - 10/11/18 1507    Education Details  Lymphedema risk reduction and post op shoulder ROM HEP    Person(s) Educated  Patient;Spouse    Methods  Explanation;Demonstration;Handout    Comprehension  Returned demonstration;Verbalized understanding          PT Long Term Goals - 10/11/18 1521      PT LONG TERM GOAL #1   Title  Patient will demonstrate she has regained full shoulder ROM and function post operatively compared to baselines.    Time  8    Period  Weeks    Status  New      Breast Clinic Goals - 10/11/18 1520      Patient will be able to verbalize understanding of pertinent lymphedema risk reduction practices relevant to her diagnosis specifically related to skin care.   Time  1    Status  Achieved      Patient will be able to return demonstrate and/or verbalize understanding of the post-op home exercise program related to regaining shoulder range of motion.   Time  1    Period  Days    Status  Achieved      Patient will be able to verbalize understanding of the importance of attending the postoperative After Breast Cancer Class for further lymphedema risk reduction education and therapeutic exercise.   Time  1    Period  Days    Status  Achieved            Plan - 10/11/18 1518    Clinical Impression Statement  Patient was diagnosed on 09/18/2018 with right grade I-II invasive ductal carcinoma breast cancer. It measures 7 mm and is located in the upper outer quadrant. It is ER/PR positive and HER2 negative with a Ki67 of 5%. She had a cervical fusion in 2009. Her multidisciplinary medical team met prior to her assessments to determine a recommended treatment plan. She is planning to have a right lumpectomy and sentinel node biopsy followed by radiation and anti-estrogen therapy. She will benefit from a post op PT reassessment to determine needs.    Stability/Clinical Decision Making  Stable/Uncomplicated     Clinical Decision Making  Low    Rehab Potential  Excellent    PT Frequency  --   Eval and 1 f/u visit   PT Treatment/Interventions  ADLs/Self Care Home Management;Patient/family education;Therapeutic exercise    PT Next Visit Plan  Will reassess 3-4 weeks post op to determine needs    PT Home Exercise Plan  Post op shoulder ROM HEP    Consulted and Agree with Plan of Care  Patient;Family member/caregiver    Family Member Consulted  Husband       Patient will benefit from skilled therapeutic intervention in order to improve the following deficits and impairments:  Decreased range of motion, Impaired UE functional use, Pain, Decreased knowledge of precautions, Postural dysfunction  Visit Diagnosis: Malignant neoplasm of upper-outer quadrant of right breast in female, estrogen receptor positive ([place]) - Plan: PT plan of care cert/re-cert  Abnormal posture - Plan: PT plan of care cert/re-cert  Patient will follow up at outpatient cancer rehab 3-4 weeks following surgery.  If the patient requires physical therapy at that time, a specific plan will be dictated and sent to the referring physician for approval. The patient was educated today on appropriate basic range of motion exercises to begin post operatively and the importance of attending the After Breast Cancer class following surgery.  Patient was educated today on lymphedema risk reduction practices as it pertains to recommendations that will benefit the patient immediately following surgery.  She verbalized good understanding.     Problem List Patient Active Problem List   Diagnosis Date Noted  . Malignant neoplasm of upper-outer quadrant of right breast in female, estrogen receptor positive ([place]) 10/04/2018   Annia Friendly, PT 10/11/18 3:22 PM  [place], [place], [address] Phone: [phone]   Fax:  [phone]  Name: Heather Hayes MRN:  [id number] Date of Birth: 03-Feb-1961

## 2018-10-11 NOTE — Telephone Encounter (Signed)
No los per 3/11. °

## 2018-10-11 NOTE — Progress Notes (Signed)
[name]   [phone] Fax:([phone] [name] Note   Patient Care Team: Molli Posey, MD as PCP - General (Obstetrics and Gynecology) Mauro Kaufmann, RN as Oncology Nurse Navigator Rockwell Germany, RN as Oncology Nurse Navigator Excell Seltzer, MD as Consulting Physician (General Surgery) Truitt Merle, MD as Consulting Physician (Hematology) Gery Pray, MD as Consulting Physician (Radiation Oncology)  Date of Service:  10/11/2018   CHIEF COMPLAINTS/PURPOSE OF CONSULTATION:  Newly diagnosed Malignant neoplasm of upper-outer quadrant of right breast    Oncology History   Cancer Staging Malignant neoplasm of upper-outer quadrant of right breast in female, estrogen receptor positive ([place]) Staging form: Breast, AJCC 8th Edition - Clinical stage from 10/02/2018: Stage IA (cT1b, cN0, cM0, G2, ER+, PR+, HER2-) - Signed by Truitt Merle, MD on 10/10/2018       Malignant neoplasm of upper-outer quadrant of right breast in female, estrogen receptor positive ([place])   09/27/2018 Mammogram    [facility] Mammogram 09/27/18  IMPRESSION: 1. Suspicious right breast mass measures 7 x 7 x 6 mm with associated vascularity at the 12 o'clock position 7 cm from the nipple. 2. No suspicious right axillary lymphadenopathy.    10/02/2018 Cancer Staging    Staging form: Breast, AJCC 8th Edition - Clinical stage from 10/02/2018: Stage IA (cT1b, cN0, cM0, G2, ER+, PR+, HER2-) - Signed by Truitt Merle, MD on 10/10/2018    10/02/2018 Initial Biopsy    Diagnosis 10/02/18 Breast, right, needle core biopsy, 12 o'clock - INVASIVE DUCTAL CARCINOMA.    10/02/2018 Receptors her2    Results: IMMUNOHISTOCHEMICAL AND MORPHOMETRIC ANALYSIS PERFORMED MANUALLY The tumor cells are NEGATIVE for Her2 (1+). Estrogen Receptor: 95%, POSITIVE, STRONG STAINING INTENSITY Progesterone Receptor: 95%, POSITIVE, STRONG STAINING INTENSITY Proliferation Marker Ki67: 5%    10/04/2018 Initial  Diagnosis    Malignant neoplasm of upper-outer quadrant of right breast in female, estrogen receptor positive ([place])      HISTORY OF PRESENTING ILLNESS:  Heather Hayes 58 y.o. female is a here because of newly diagnosed right breast cancer. The patient presents to the [place] clinic today accompanied by her husband.  This mass was found by screening mammogram. She has been having yearly mammograms and this is her first abnormal screening. She overall feels at baseline with no breast change.  Today the patient notes having right hip bursitis which has been going on for a few years. She notes if she is out on short term disability she can get some of her treatment covered by her job.   Socially she is married with 3 adult children. She drinks wine 2-3 times week and is a non-smoker. She works as an Water quality scientist.  She has mildly elevated cholesterol. She also has anxiety for which she is on medication. She overall does not have a significant past medication history. She has been on Progesterone and Estradial for the past 2 years. She was taking these for hot flashes and mood swings. She started menopause after endometrial ablation and no longer had period. She had a surgery to replace cervical herniated disc with spinal fusion in 2009. She also had tubal ligation in the past.  Her mother had breast caner at 31 and maternal grandmother had ovarian cancer. She did genetic testing with Dr Matthew Saras in 2019 which was negative.     GYN HISTORY  Menarchal: 12 LMP: 2009 after endometrial ablation  Contraceptive: yes, 26-27 years on and off HRT: On Progesterone and Estradial, started  2 years ago G3P3: First at 58yo. No breast feeding.     REVIEW OF SYSTEMS:    Constitutional: Denies fevers, chills or abnormal night sweats Eyes: Denies blurriness of vision, double vision or watery eyes Ears, nose, mouth, throat, and face: Denies mucositis or sore throat Respiratory: Denies cough, dyspnea  or wheezes Cardiovascular: Denies palpitation, chest discomfort or lower extremity swelling Gastrointestinal:  Denies nausea, heartburn or change in bowel habits Skin: Denies abnormal skin rashes Lymphatics: Denies new lymphadenopathy or easy bruising Neurological:Denies numbness, tingling or new weaknesses Behavioral/Psych: Mood is stable, no new changes  All other systems were reviewed with the patient and are negative.   MEDICAL HISTORY:  Past Medical History:  Diagnosis Date   Anxiety    Arthritis     SURGICAL HISTORY: Past Surgical History:  Procedure Laterality Date   CERVICAL SPINE SURGERY  2009   TUBAL LIGATION     tube ligation       SOCIAL HISTORY: Social History   Socioeconomic History   Marital status: Married    Spouse name: Not on file   Number of children: Not on file   Years of education: Not on file   Highest education level: Not on file  Occupational History   Not on file  Social Needs   Financial resource strain: Not on file   Food insecurity:    Worry: Not on file    Inability: Not on file   Transportation needs:    Medical: Not on file    Non-medical: Not on file  Tobacco Use   Smoking status: Never Smoker   Smokeless tobacco: Never Used  Substance and Sexual Activity   Alcohol use: Yes    Alcohol/week: 1.0 - 2.0 standard drinks    Types: 1 - 2 Glasses of wine per week   Drug use: Never   Sexual activity: Not on file  Lifestyle   Physical activity:    Days per week: Not on file    Minutes per session: Not on file   Stress: Not on file  Relationships   Social connections:    Talks on phone: Not on file    Gets together: Not on file    Attends religious service: Not on file    Active member of club or organization: Not on file    Attends meetings of clubs or organizations: Not on file    Relationship status: Not on file   Intimate partner violence:    Fear of current or ex partner: Not on file    Emotionally  abused: Not on file    Physically abused: Not on file    Forced sexual activity: Not on file  Other Topics Concern   Not on file  Social History Narrative   Not on file    FAMILY HISTORY: Family History  Problem Relation Age of Onset   Breast cancer Mother 48   Ovarian cancer Maternal Grandmother     ALLERGIES:  is allergic to compazine [prochlorperazine edisylate].  MEDICATIONS:  Current Outpatient Medications  Medication Sig Dispense Refill   clonazePAM (KLONOPIN) 0.5 MG tablet Take 0.5 mg by mouth 2 (two) times daily as needed for anxiety.     estradiol (EVAMIST) 1.53 MG/SPRAY transdermal spray Place 1 spray onto the skin daily.     meloxicam (MOBIC) 15 MG tablet Take 15 mg by mouth daily.     progesterone (ENDOMETRIN) 100 MG vaginal insert Place 100 mg vaginally 2 (two) times daily.  No current facility-administered medications for this visit.     PHYSICAL EXAMINATION: ECOG PERFORMANCE STATUS: 0 - Asymptomatic  Vitals:   10/11/18 1259  BP: 138/68  Pulse: 64  Resp: 18  Temp: 98.6 F (37 C)  SpO2: 100%   Filed Weights   10/11/18 1259  Weight: 192 lb 4.8 oz (87.2 kg)    GENERAL:alert, no distress and comfortable SKIN: skin color, texture, turgor are normal, no rashes or significant lesions EYES: normal, conjunctiva are pink and non-injected, sclera clear OROPHARYNX:no exudate, no erythema and lips, buccal mucosa, and tongue normal  NECK: supple, thyroid normal size, non-tender, without nodularity LYMPH:  no palpable lymphadenopathy in the cervical, axillary or inguinal LUNGS: clear to auscultation and percussion with normal breathing effort HEART: regular rate & rhythm and no murmurs and no lower extremity edema ABDOMEN:abdomen soft, non-tender and normal bowel sounds Musculoskeletal:no cyanosis of digits and no clubbing  PSYCH: alert & oriented x 3 with fluent speech NEURO: no focal motor/sensory deficits BREAST: (+) Mild skin ecchymosis with  mild tenderness at biopsy site of right breast (+) No palpable mass or adenopathy of either breasts  LABORATORY DATA:  I have reviewed the data as listed CBC Latest Ref Rng & Units 10/11/2018 09/06/2007  WBC 4.0 - 10.5 K/uL 6.7 6.3  Hemoglobin 12.0 - 15.0 g/dL 14.2 12.2  Hematocrit 36.0 - 46.0 % 44.5 36.2  Platelets 150 - 400 K/uL 267 284    CMP Latest Ref Rng & Units 10/11/2018 09/06/2007  Glucose 70 - 99 mg/dL 75 103(H)  BUN 6 - 20 mg/dL 18 9  Creatinine 0.44 - 1.00 mg/dL 0.81 0.59  Sodium 135 - 145 mmol/L 142 136  Potassium 3.5 - 5.1 mmol/L 4.3 3.9  Chloride 98 - 111 mmol/L 105 102  CO2 22 - 32 mmol/L 26 28  Calcium 8.9 - 10.3 mg/dL 10.1 9.4  Total Protein 6.5 - 8.1 g/dL 8.5(H) -  Total Bilirubin 0.3 - 1.2 mg/dL 0.7 -  Alkaline Phos 38 - 126 U/L 95 -  AST 15 - 41 U/L 15 -  ALT 0 - 44 U/L 11 -     RADIOGRAPHIC STUDIES: I have personally reviewed the radiological images as listed and agreed with the findings in the report. US Breast Ltd Uni Right Inc Axilla  Result Date: 09/27/2018 CLINICAL DATA:  58 year old female recalled from screening mammogram dated 09/18/2018 for possible right breast mass. EXAM: DIGITAL DIAGNOSTIC RIGHT MAMMOGRAM WITH CAD AND TOMO ULTRASOUND RIGHT BREAST COMPARISON:  Previous exam(s). ACR Breast Density Category c: The breast tissue is heterogeneously dense, which may obscure small masses. FINDINGS: There is a persistent round, spiculated mass in the superior central right breast at far posterior depth. Further evaluation with ultrasound was performed. Mammographic images were processed with CAD. Targeted ultrasound is performed, showing an irregular hypoechoic mass with associated vascularity at the 12 o'clock position 7 cm from the nipple. It measures 7 x 7 x 6 mm. This correlates with the mammographic finding. Evaluation of the right axilla demonstrates no suspicious lymphadenopathy. IMPRESSION: 1. Suspicious right breast mass corresponding with the screening  mammographic findings. Recommendation is for ultrasound-guided biopsy. 2. No suspicious right axillary lymphadenopathy. RECOMMENDATION: 1. Ultrasound-guided biopsy of the right breast. 2. Given the patient's breast density, further evaluation with contrast enhanced breast MRI should be considered pending biopsy results. I have discussed the findings and recommendations with the patient. Results were also provided in writing at the conclusion of the visit. If applicable, a reminder letter will  be sent to the patient regarding the next appointment. BI-RADS CATEGORY  4: Suspicious. Electronically Signed   By: Kristopher Oppenheim M.D.   On: 09/27/2018 15:46   Mm Diag Breast Tomo Uni Right  Result Date: 09/27/2018 CLINICAL DATA:  58 year old female recalled from screening mammogram dated 09/18/2018 for possible right breast mass. EXAM: DIGITAL DIAGNOSTIC RIGHT MAMMOGRAM WITH CAD AND TOMO ULTRASOUND RIGHT BREAST COMPARISON:  Previous exam(s). ACR Breast Density Category c: The breast tissue is heterogeneously dense, which may obscure small masses. FINDINGS: There is a persistent round, spiculated mass in the superior central right breast at far posterior depth. Further evaluation with ultrasound was performed. Mammographic images were processed with CAD. Targeted ultrasound is performed, showing an irregular hypoechoic mass with associated vascularity at the 12 o'clock position 7 cm from the nipple. It measures 7 x 7 x 6 mm. This correlates with the mammographic finding. Evaluation of the right axilla demonstrates no suspicious lymphadenopathy. IMPRESSION: 1. Suspicious right breast mass corresponding with the screening mammographic findings. Recommendation is for ultrasound-guided biopsy. 2. No suspicious right axillary lymphadenopathy. RECOMMENDATION: 1. Ultrasound-guided biopsy of the right breast. 2. Given the patient's breast density, further evaluation with contrast enhanced breast MRI should be considered pending  biopsy results. I have discussed the findings and recommendations with the patient. Results were also provided in writing at the conclusion of the visit. If applicable, a reminder letter will be sent to the patient regarding the next appointment. BI-RADS CATEGORY  4: Suspicious. Electronically Signed   By: Kristopher Oppenheim M.D.   On: 09/27/2018 15:46   Mm Clip Placement Right  Result Date: 10/02/2018 CLINICAL DATA:  Status post ultrasound-guided core biopsy of the mass in the 12 o'clock region of the right breast. EXAM: DIAGNOSTIC RIGHT MAMMOGRAM POST ULTRASOUND BIOPSY COMPARISON:  Previous exam(s). FINDINGS: Mammographic images were obtained following ultrasound guided biopsy of a mass in the 12 o'clock region of the right breast. Mammographic images showed there is a ribbon shaped clip directly adjacent (inferior and posterior) to the mass in the 12 o'clock region of the breast. IMPRESSION: Status post ultrasound-guided core biopsy of a right breast mass with pathology pending. Final Assessment: Post Procedure Mammograms for Marker Placement Electronically Signed   By: Lillia Mountain M.D.   On: 10/02/2018 14:24   [name] Rt Breast Bx W Loc Dev 1st Lesion Img Bx Spec US Guide  Addendum Date: 10/04/2018   ADDENDUM REPORT: 10/03/2018 14:52 ADDENDUM: Pathology revealed GRADE I-II INVASIVE DUCTAL CARCINOMA of the Right breast, 12 o'clock. This was found to be concordant by Dr. Lillia Mountain. Pathology results were discussed with the patient by telephone. The patient reported doing well after the biopsy with tenderness at the site. Post biopsy instructions and care were reviewed and questions were answered. The patient was encouraged to call [facility] for any additional concerns. The patient was referred to [facility] on October 11, 2018. Pathology results reported by Terie Purser, RN on 10/03/2018. Electronically Signed    By: Lillia Mountain M.D.   On: 10/03/2018 14:52   Result Date: 10/04/2018 CLINICAL DATA:  Right breast mass. EXAM: ULTRASOUND GUIDED RIGHT BREAST CORE NEEDLE BIOPSY COMPARISON:  Previous exam(s). FINDINGS: I met with the patient and we discussed the procedure of ultrasound-guided biopsy, including benefits and alternatives. We discussed the high likelihood of a successful procedure. We discussed the risks of the procedure, including infection, bleeding, tissue injury, clip  migration, and inadequate sampling. Informed written consent was given. The usual time-out protocol was performed immediately prior to the procedure. Lesion quadrant: 12 o'clock Using sterile technique and 1% lidocaine and 1% lidocaine with epinephrine as local anesthetic, under direct ultrasound visualization, a 14 gauge spring-loaded device was used to perform biopsy of a mass in the 12 o'clock region of the right breast using a lateral to me approach. A ribbon shaped tissue marker clip was deployed into the biopsy cavity. Follow up 2 view mammogram was performed and dictated separately. IMPRESSION: Ultrasound guided biopsy of the right breast. No apparent complications. Electronically Signed: By: Lillia Mountain M.D. On: 10/02/2018 14:12    ASSESSMENT & PLAN:  Heather Hayes is a 58 y.o. Caucasian female with no significant medical history outside of right hip bursitis, anxiety and elevated cholesterol.   1. Malignant neoplasm of upper-outer quadrant of right breast, Stage IA, c (T1b,N0,M0), ER/PR+, HER2-, Grade II -We discussed her image findings and the biopsy results in great details. She has invasive ductal carcinoma of right breast.  -Giving the small size of the tumor, she is likely a candidate for lumpectomy with sentinel lymph node biopsy. She is agreeable with that. She was seen by Dr. Excell Seltzer today and likely will proceed with surgery soon.   -We discussed risk of recurrence after surgical resection.  Given the strong ER PR  positive, HER-2 negative disease, early stage, this is likely low risk disease.  If her tumor is more than 1 cm, or more than 5 mm with grade 2/3 on surgical sample, then I recommend a Oncotype Dx test on the surgical sample and we'll make a decision about adjuvant chemotherapy. Written material of this test was given to her. She is in overall good health, would be a good candidate for chemotherapy if her Oncotype recurrence score is high. -If her surgical sentinel lymph node positive, I recommend mammaprint for further risk stratification and guide adjuvant chemotherapy..  -She was also seen by radiation oncologist Dr. Sondra Come today. If her surgical sentinel lymph nodes were negative, she would not need post mastectomy radiation. Otherwise radiation is recommended to reduce the risk for local recurrence.  -Giving the strong ER and PR expression in her postmenopausal status, I recommend adjuvant endocrine therapy with aromatase inhibitor for a total of 5-10 years to reduce the risk of cancer recurrence. Potential benefits and side effects were discussed with patient and she is interested. -We also discussed the breast cancer surveillance after her surgery. She will continue annual screening mammogram, self exam, and a routine office visit with lab and exam with [name]. -She had genetic testing in 2019 with Dr. Matthew Saras which was negative.  -I encouraged her to have healthy diet and exercise regularly -Labs reviewed today CBC and CMP WNL except protein at 8.5.   -F/u likely after radiation, or sooner if needed     PLAN:  -Proceed with surgery soon  -will order oncotype if tumor more than 1 cm, or more than 5 mm with grade 2 or 3 -F/u likely after radiation or sooner if needed    No orders of the defined types were placed in this encounter.   All questions were answered. The patient knows to call the clinic with any problems, questions or concerns. I spent 30 minutes counseling the patient face to face.  The total time spent in the appointment was 45 minutes and more than 50% was on counseling.     Truitt Merle, MD 10/11/2018 7:49  PM  I, Joslyn Devon, am acting as scribe for Truitt Merle, MD.   I have reviewed the above documentation for accuracy and completeness, and I agree with the above.

## 2018-10-12 ENCOUNTER — Other Ambulatory Visit: Payer: Self-pay | Admitting: General Surgery

## 2018-10-12 DIAGNOSIS — C50911 Malignant neoplasm of unspecified site of right female breast: Secondary | ICD-10-CM

## 2018-10-17 ENCOUNTER — Telehealth: Payer: Self-pay | Admitting: *Deleted

## 2018-10-17 NOTE — Progress Notes (Signed)
[place] [facility] Psychosocial Distress Screening Spiritual Care  Met with Letty in [facility] to introduce [facility] team/resources, reviewing distress screen per protocol.  The patient scored a 5 on the Psychosocial Distress Thermometer which indicates moderate distress. Also assessed for distress and other psychosocial needs.   ONCBCN DISTRESS SCREENING 10/17/2018  Screening Type Initial Screening  Distress experienced in past week (1-10) 5  Practical problem type Insurance;Work/school  Emotional problem type Nervousness/Anxiety  Physical Problem type Sleep/insomnia;Skin dry/itchy  Referral to support programs Yes   The pt presented to [facility] with her husband. She shared that her distress remains at a 5 due to still processing the information she received today. The pt reported feeling well supported by her family and coworkers. She expressed that her primary concern is how treatment will affect her ability to maintain employment. 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 The pt was referred to the CSWs in the [facility]. The pt expressed that she will keep this referral in mind if she needs support in the future, but she reported no current need for support programs.   Follow up needed: No.  Doris Cheadle, Counseling Intern [phone]

## 2018-10-17 NOTE — Telephone Encounter (Signed)
Spoke to pt concerning [place] from 3.11.20. Denies questions or concerns regarding dx or treatment care plan. Encourage pt to call with needs. Received verbal understanding.

## 2018-10-19 ENCOUNTER — Encounter (HOSPITAL_BASED_OUTPATIENT_CLINIC_OR_DEPARTMENT_OTHER): Payer: Self-pay | Admitting: *Deleted

## 2018-10-19 ENCOUNTER — Other Ambulatory Visit: Payer: Self-pay

## 2018-10-26 ENCOUNTER — Ambulatory Visit
Admission: RE | Admit: 2018-10-26 | Discharge: 2018-10-26 | Disposition: A | Discharge status: Home / Self Care | Payer: 59 | Source: Ambulatory Visit | Attending: General Surgery | Admitting: General Surgery

## 2018-10-26 ENCOUNTER — Other Ambulatory Visit: Payer: Self-pay

## 2018-10-26 DIAGNOSIS — C50911 Malignant neoplasm of unspecified site of right female breast: Secondary | ICD-10-CM | POA: Diagnosis not present

## 2018-10-26 NOTE — H&P (Signed)
History of Present Illness Heather Kitchen T. Haward Pope MD; 10/11/2018 1:40 PM) The patient is a 58 year old female who presents with breast cancer. She is a postmenopausal female referred by Dr. Miquel Dunn for evaluation of recently diagnosed carcinoma of the right breast. She recently presented for a screening mamogram revealing a new small mass in the UOQ. Subsequent imaging included diagnostic mamogram showing a persistent small spiculated mass in the upper central breast far posterior, and ultrasound showing a 7 mm irregular hypoechoic mass at the 12:00 position 7 cm from the nipple. An ultrasound guided breast biopsy was performed on 10/02/2018 with pathology revealing invasive ductal carcinoma of the breast. She is seen now in breast multidisciplinary clinic for initial treatment planning. She has experienced no breast symptoms, specifically mass, nipple discharge, pain or skin changes. She has a history of fibrocystic breast disease with previous cyst aspirations. She has a history of breast cancer in her mother postmenopausal and ovarian cancer in her grandmother. patient personally has had previous genetic testing that was negative approximately 2017  Findings at that time were the following: Tumor size: 0.7 cm Tumor grade: 1-2, Ki-67 5% Estrogen Receptor: +95% Progesterone Receptor: +95% Her-2 neu: negative Lymph node status: negative    Diagnostic Studies History Tawni Pummel, RN; 10/11/2018 7:25 AM) Colonoscopy  1-5 years ago Mammogram  within last year Pap Smear  1-5 years ago  Medication History Tawni Pummel, RN; 10/11/2018 7:25 AM) Medications Reconciled  Social History Tawni Pummel, RN; 10/11/2018 7:25 AM) Alcohol use  Occasional alcohol use. Caffeine use  Coffee. No drug use  Tobacco use  Never smoker.  Family History Tawni Pummel, RN; 10/11/2018 7:25 AM) Arthritis  Mother. Bleeding disorder  Mother. Depression  Father. Diabetes Mellitus  Father,  Mother. Heart Disease  Brother. Hypertension  Father, Mother, Sister.  Pregnancy / Birth History Tawni Pummel, RN; 10/11/2018 7:25 AM) Age at menarche  54 years. Age of menopause  67-50 Gravida  4 Irregular periods  Maternal age  33-25 Para  75  Other Problems Tawni Pummel, RN; 10/11/2018 7:25 AM) Anxiety Disorder  Arthritis  Hypercholesterolemia     Review of Systems Sunday Spillers Ledford RN; 10/11/2018 7:25 AM) General Not Present- Appetite Loss, Chills, Fatigue, Fever, Night Sweats, Weight Gain and Weight Loss. Skin Not Present- Change in Wart/Mole, Dryness, Hives, Jaundice, New Lesions, Non-Healing Wounds, Rash and Ulcer. HEENT Present- Sinus Pain. Not Present- Earache, Hearing Loss, Hoarseness, Nose Bleed, Oral Ulcers, Ringing in the Ears, Seasonal Allergies, Sore Throat, Visual Disturbances, Wears glasses/contact lenses and Yellow Eyes. Respiratory Not Present- Bloody sputum, Chronic Cough, Difficulty Breathing, Snoring and Wheezing. Breast Present- Breast Pain. Not Present- Breast Mass, Nipple Discharge and Skin Changes. Cardiovascular Not Present- Chest Pain, Difficulty Breathing Lying Down, Leg Cramps, Palpitations, Rapid Heart Rate, Shortness of Breath and Swelling of Extremities. Gastrointestinal Not Present- Abdominal Pain, Bloating, Bloody Stool, Change in Bowel Habits, Chronic diarrhea, Constipation, Difficulty Swallowing, Excessive gas, Gets full quickly at meals, Hemorrhoids, Indigestion, Nausea, Rectal Pain and Vomiting. Female Genitourinary Not Present- Frequency, Nocturia, Painful Urination, Pelvic Pain and Urgency. Neurological Not Present- Decreased Memory, Fainting, Headaches, Numbness, Seizures, Tingling, Tremor, Trouble walking and Weakness. Psychiatric Present- Anxiety. Not Present- Bipolar, Change in Sleep Pattern, Depression, Fearful and Frequent crying. Endocrine Not Present- Cold Intolerance, Excessive Hunger, Hair Changes, Heat Intolerance, Hot  flashes and New Diabetes. Hematology Present- Easy Bruising. Not Present- Blood Thinners, Excessive bleeding, Gland problems, HIV and Persistent Infections.   Physical Exam Heather Kitchen T. Daishawn Lauf MD; 10/11/2018 1:41 PM) The physical  exam findings are as follows: Note:General: Alert, well-developed and well nourished Caucasian female, in no distress Skin: Warm and dry without rash or infection. HEENT: No palpable masses or thyromegaly. Sclera nonicteric. Pupils equal round and reactive. Lymph nodes: No cervical, supraclavicular, nodes palpable. Breasts: Slight thickening and bruising of her right breast post biopsy. No other palpable abnormalities. No skin changes or nipple crusting or inversion. No palpable axillary adenopathy. Lungs: Breath sounds clear and equal. No wheezing or increased work of breathing. Cardiovascular: Regular rate and rhythm without murmer. No JVD or edema. Abdomen: Nondistended. Soft and nontender. No masses palpable. No organomegaly. Extremities: No edema or joint swelling or deformity. No chronic venous stasis changes. Neurologic: Alert and fully oriented. Gait normal. No focal weakness. Psychiatric: Normal mood and affect. Thought content appropriate with normal judgement and insight    Assessment & Plan Heather Kitchen T. Sinai Illingworth MD; 10/11/2018 1:44 PM) MALIGNANT NEOPLASM OF LEFT BREAST, STAGE 1, ESTROGEN RECEPTOR POSITIVE (C50.912) Impression: 58 year old female with a new diagnosis of cancer of the right breast, upper outer quadrant. Clinical stage 1a, ER positive, PR positive, HER-2 negative. I discussed with the patient and her husband present today initial surgical treatment options. We discussed options of breast conservation with lumpectomy or total mastectomy and sentinal lymph node biopsy/dissection. After discussion they have elected to proceed with breast conservation with lumpectomy and sentinel lymph node biopsy. We discussed the indications and nature of the  procedure, and expected recovery, in detail. Surgical risks including anesthetic complications, cardiorespiratory complications, bleeding, infection, wound healing complications, blood clots, lymphedema, local and distant recurrence and possible need for further surgery based on the final pathology was discussed and understood. Chemotherapy, hormonal therapy and radiation therapy have been discussed. They have been provided with literature regarding the treatment of breast cancer. All questions were answered. They understand and agree to proceed and we will go ahead with scheduling.   Current Plans  Radioactive seed localized right breast lumpectomy and axillary sentinel lymph node biopsy under general anesthesia as an outpatient

## 2018-10-26 NOTE — Progress Notes (Signed)
Pt presents to pick up Ensure presurgical drink and Hibiclens, instructions on Hibiclens and to have Ensure consumed by 5:45am. Pt verbalized understanding.

## 2018-10-27 ENCOUNTER — Ambulatory Visit (HOSPITAL_BASED_OUTPATIENT_CLINIC_OR_DEPARTMENT_OTHER)
Admission: RE | Admit: 2018-10-27 | Discharge: 2018-10-27 | Disposition: A | Discharge status: Home / Self Care | Payer: 59 | Attending: General Surgery | Admitting: General Surgery

## 2018-10-27 ENCOUNTER — Other Ambulatory Visit: Payer: Self-pay

## 2018-10-27 ENCOUNTER — Encounter (HOSPITAL_BASED_OUTPATIENT_CLINIC_OR_DEPARTMENT_OTHER): Payer: Self-pay | Admitting: *Deleted

## 2018-10-27 ENCOUNTER — Ambulatory Visit (HOSPITAL_BASED_OUTPATIENT_CLINIC_OR_DEPARTMENT_OTHER): Payer: 59 | Admitting: Anesthesiology

## 2018-10-27 ENCOUNTER — Ambulatory Visit (HOSPITAL_COMMUNITY)
Admission: RE | Admit: 2018-10-27 | Discharge: 2018-10-27 | Disposition: A | Discharge status: Home / Self Care | Payer: 59 | Source: Ambulatory Visit | Attending: General Surgery | Admitting: General Surgery

## 2018-10-27 ENCOUNTER — Ambulatory Visit
Admission: RE | Admit: 2018-10-27 | Discharge: 2018-10-27 | Disposition: A | Discharge status: Home / Self Care | Payer: 59 | Source: Ambulatory Visit | Attending: General Surgery | Admitting: General Surgery

## 2018-10-27 ENCOUNTER — Encounter (HOSPITAL_BASED_OUTPATIENT_CLINIC_OR_DEPARTMENT_OTHER)
Admission: RE | Disposition: A | Discharge status: Home / Self Care | Payer: Self-pay | Source: Home / Self Care | Attending: General Surgery

## 2018-10-27 DIAGNOSIS — Z170 Estrogen receptor positive status [ER+]: Secondary | ICD-10-CM | POA: Diagnosis not present

## 2018-10-27 DIAGNOSIS — C50911 Malignant neoplasm of unspecified site of right female breast: Secondary | ICD-10-CM | POA: Diagnosis not present

## 2018-10-27 DIAGNOSIS — C50411 Malignant neoplasm of upper-outer quadrant of right female breast: Secondary | ICD-10-CM | POA: Insufficient documentation

## 2018-10-27 DIAGNOSIS — G8918 Other acute postprocedural pain: Secondary | ICD-10-CM | POA: Diagnosis not present

## 2018-10-27 HISTORY — PX: BREAST LUMPECTOMY: SHX2

## 2018-10-27 HISTORY — PX: BREAST LUMPECTOMY WITH RADIOACTIVE SEED AND SENTINEL LYMPH NODE BIOPSY: SHX6550

## 2018-10-27 SURGERY — BREAST LUMPECTOMY WITH RADIOACTIVE SEED AND SENTINEL LYMPH NODE BIOPSY
Anesthesia: General | Site: Breast | Laterality: Right

## 2018-10-27 MED ORDER — OXYCODONE HCL 5 MG/5ML PO SOLN: 5.0000 mg | Freq: Once | ORAL | Status: DC | PRN

## 2018-10-27 MED ORDER — ACETAMINOPHEN 160 MG/5ML PO SOLN: 325.0000 mg | ORAL | Status: DC | PRN

## 2018-10-27 MED ORDER — MIDAZOLAM HCL 2 MG/2ML IJ SOLN
INTRAMUSCULAR | Status: AC
  Filled 2018-10-27: qty 2

## 2018-10-27 MED ORDER — TRAMADOL HCL 50 MG PO TABS: 50.0000 mg | ORAL_TABLET | Freq: Four times a day (QID) | ORAL | 0 refills | Status: DC | PRN

## 2018-10-27 MED ORDER — LIDOCAINE 2% (20 MG/ML) 5 ML SYRINGE
INTRAMUSCULAR | Status: AC
  Filled 2018-10-27: qty 5

## 2018-10-27 MED ORDER — SCOPOLAMINE 1 MG/3DAYS TD PT72: 1.0000 | MEDICATED_PATCH | Freq: Once | TRANSDERMAL | Status: DC | PRN

## 2018-10-27 MED ORDER — GABAPENTIN 300 MG PO CAPS
300.0000 mg | ORAL_CAPSULE | ORAL | Status: AC
  Administered 2018-10-27: 300 mg via ORAL

## 2018-10-27 MED ORDER — CHLORHEXIDINE GLUCONATE CLOTH 2 % EX PADS: 6.0000 | MEDICATED_PAD | Freq: Once | CUTANEOUS | Status: DC

## 2018-10-27 MED ORDER — CELECOXIB 200 MG PO CAPS
ORAL_CAPSULE | ORAL | Status: AC
  Filled 2018-10-27: qty 1

## 2018-10-27 MED ORDER — ONDANSETRON HCL 4 MG/2ML IJ SOLN: 4.0000 mg | Freq: Once | INTRAMUSCULAR | Status: DC | PRN

## 2018-10-27 MED ORDER — LACTATED RINGERS IV SOLN
INTRAVENOUS | Status: DC
  Administered 2018-10-27: 10:00:00 via INTRAVENOUS

## 2018-10-27 MED ORDER — TECHNETIUM TC 99M SULFUR COLLOID FILTERED
1.0000 | Freq: Once | INTRAVENOUS | Status: AC | PRN
  Administered 2018-10-27: 1 via INTRADERMAL

## 2018-10-27 MED ORDER — MEPERIDINE HCL 25 MG/ML IJ SOLN: 6.2500 mg | INTRAMUSCULAR | Status: DC | PRN

## 2018-10-27 MED ORDER — FENTANYL CITRATE (PF) 100 MCG/2ML IJ SOLN: 25.0000 ug | INTRAMUSCULAR | Status: DC | PRN

## 2018-10-27 MED ORDER — ACETAMINOPHEN 500 MG PO TABS
1000.0000 mg | ORAL_TABLET | ORAL | Status: AC
  Administered 2018-10-27: 1000 mg via ORAL

## 2018-10-27 MED ORDER — PROPOFOL 10 MG/ML IV BOLUS
INTRAVENOUS | Status: AC
  Filled 2018-10-27: qty 40

## 2018-10-27 MED ORDER — OXYCODONE HCL 5 MG PO TABS: 5.0000 mg | ORAL_TABLET | Freq: Once | ORAL | Status: DC | PRN

## 2018-10-27 MED ORDER — BUPIVACAINE-EPINEPHRINE (PF) 0.5% -1:200000 IJ SOLN
INTRAMUSCULAR | Status: DC | PRN
  Administered 2018-10-27: 20 mL via PERINEURAL

## 2018-10-27 MED ORDER — CEFAZOLIN SODIUM-DEXTROSE 2-4 GM/100ML-% IV SOLN
INTRAVENOUS | Status: AC
  Filled 2018-10-27: qty 100

## 2018-10-27 MED ORDER — CEFAZOLIN SODIUM-DEXTROSE 2-4 GM/100ML-% IV SOLN: 2.0000 g | INTRAVENOUS | Status: DC

## 2018-10-27 MED ORDER — FENTANYL CITRATE (PF) 100 MCG/2ML IJ SOLN
INTRAMUSCULAR | Status: AC
  Filled 2018-10-27: qty 2

## 2018-10-27 MED ORDER — ACETAMINOPHEN 500 MG PO TABS
ORAL_TABLET | ORAL | Status: AC
  Filled 2018-10-27: qty 2

## 2018-10-27 MED ORDER — BUPIVACAINE LIPOSOME 1.3 % IJ SUSP
INTRAMUSCULAR | Status: DC | PRN
  Administered 2018-10-27: 10 mL via PERINEURAL

## 2018-10-27 MED ORDER — DEXAMETHASONE SODIUM PHOSPHATE 10 MG/ML IJ SOLN
INTRAMUSCULAR | Status: AC
  Filled 2018-10-27: qty 1

## 2018-10-27 MED ORDER — GABAPENTIN 300 MG PO CAPS
ORAL_CAPSULE | ORAL | Status: AC
  Filled 2018-10-27: qty 1

## 2018-10-27 MED ORDER — EPHEDRINE SULFATE 50 MG/ML IJ SOLN
INTRAMUSCULAR | Status: DC | PRN
  Administered 2018-10-27: 15 mg via INTRAVENOUS

## 2018-10-27 MED ORDER — MIDAZOLAM HCL 2 MG/2ML IJ SOLN
1.0000 mg | INTRAMUSCULAR | Status: DC | PRN
  Administered 2018-10-27 (×2): 2 mg via INTRAVENOUS

## 2018-10-27 MED ORDER — ONDANSETRON HCL 4 MG/2ML IJ SOLN
INTRAMUSCULAR | Status: DC | PRN
  Administered 2018-10-27: 4 mg via INTRAVENOUS

## 2018-10-27 MED ORDER — ACETAMINOPHEN 325 MG PO TABS: 325.0000 mg | ORAL_TABLET | ORAL | Status: DC | PRN

## 2018-10-27 MED ORDER — PROPOFOL 10 MG/ML IV BOLUS
INTRAVENOUS | Status: DC | PRN
  Administered 2018-10-27: 150 mg via INTRAVENOUS

## 2018-10-27 MED ORDER — DEXAMETHASONE SODIUM PHOSPHATE 10 MG/ML IJ SOLN
INTRAMUSCULAR | Status: DC | PRN
  Administered 2018-10-27: 10 mg via INTRAVENOUS

## 2018-10-27 MED ORDER — CELECOXIB 200 MG PO CAPS
200.0000 mg | ORAL_CAPSULE | ORAL | Status: AC
  Administered 2018-10-27: 200 mg via ORAL

## 2018-10-27 MED ORDER — ONDANSETRON HCL 4 MG/2ML IJ SOLN
INTRAMUSCULAR | Status: AC
  Filled 2018-10-27: qty 2

## 2018-10-27 MED ORDER — CEFAZOLIN SODIUM-DEXTROSE 2-3 GM-%(50ML) IV SOLR
INTRAVENOUS | Status: DC | PRN
  Administered 2018-10-27: 2 g via INTRAVENOUS

## 2018-10-27 MED ORDER — FENTANYL CITRATE (PF) 100 MCG/2ML IJ SOLN
50.0000 ug | INTRAMUSCULAR | Status: AC | PRN
  Administered 2018-10-27: 100 ug via INTRAVENOUS
  Administered 2018-10-27 (×2): 50 ug via INTRAVENOUS

## 2018-10-27 SURGICAL SUPPLY — 50 items
ADH SKN CLS APL DERMABOND .7 (GAUZE/BANDAGES/DRESSINGS) ×1
APL PRP STRL LF DISP 70% ISPRP (MISCELLANEOUS) ×1
BINDER BREAST LRG (GAUZE/BANDAGES/DRESSINGS) IMPLANT
BINDER BREAST MEDIUM (GAUZE/BANDAGES/DRESSINGS) IMPLANT
BINDER BREAST XLRG (GAUZE/BANDAGES/DRESSINGS) ×1 IMPLANT
BINDER BREAST XXLRG (GAUZE/BANDAGES/DRESSINGS) IMPLANT
BLADE SURG 15 STRL LF DISP TIS (BLADE) ×1 IMPLANT
BLADE SURG 15 STRL SS (BLADE) ×2
CANISTER SUC SOCK COL 7IN (MISCELLANEOUS) IMPLANT
CANISTER SUCT 1200ML W/VALVE (MISCELLANEOUS) ×1 IMPLANT
CHLORAPREP W/TINT 26 (MISCELLANEOUS) ×2 IMPLANT
CLIP VESOCCLUDE MED 6/CT (CLIP) ×2 IMPLANT
CLIP VESOCCLUDE SM WIDE 6/CT (CLIP) ×1 IMPLANT
COVER BACK TABLE REUSABLE LG (DRAPES) ×2 IMPLANT
COVER MAYO STAND REUSABLE (DRAPES) ×2 IMPLANT
COVER PROBE W GEL 5X96 (DRAPES) ×2 IMPLANT
COVER WAND RF STERILE (DRAPES) IMPLANT
DECANTER SPIKE VIAL GLASS SM (MISCELLANEOUS) IMPLANT
DERMABOND ADVANCED (GAUZE/BANDAGES/DRESSINGS) ×1
DERMABOND ADVANCED .7 DNX12 (GAUZE/BANDAGES/DRESSINGS) ×1 IMPLANT
DRAPE LAPAROSCOPIC ABDOMINAL (DRAPES) ×2 IMPLANT
DRAPE UTILITY XL STRL (DRAPES) ×2 IMPLANT
ELECT COATED BLADE 2.86 ST (ELECTRODE) ×2 IMPLANT
ELECT REM PT RETURN 9FT ADLT (ELECTROSURGICAL) ×2
ELECTRODE REM PT RTRN 9FT ADLT (ELECTROSURGICAL) ×1 IMPLANT
GLOVE BIOGEL PI IND STRL 8 (GLOVE) ×1 IMPLANT
GLOVE BIOGEL PI INDICATOR 8 (GLOVE) ×1
GLOVE ECLIPSE 7.5 STRL STRAW (GLOVE) ×3 IMPLANT
GOWN STRL REUS W/ TWL LRG LVL3 (GOWN DISPOSABLE) ×1 IMPLANT
GOWN STRL REUS W/ TWL XL LVL3 (GOWN DISPOSABLE) ×1 IMPLANT
GOWN STRL REUS W/TWL LRG LVL3 (GOWN DISPOSABLE) ×2
GOWN STRL REUS W/TWL XL LVL3 (GOWN DISPOSABLE) ×2
ILLUMINATOR WAVEGUIDE N/F (MISCELLANEOUS) ×1 IMPLANT
KIT MARKER MARGIN INK (KITS) ×2 IMPLANT
NDL HYPO 25X1 1.5 SAFETY (NEEDLE) ×2 IMPLANT
NDL SAFETY ECLIPSE 18X1.5 (NEEDLE) ×1 IMPLANT
NEEDLE HYPO 18GX1.5 SHARP (NEEDLE)
NEEDLE HYPO 25X1 1.5 SAFETY (NEEDLE) ×2 IMPLANT
NS IRRIG 1000ML POUR BTL (IV SOLUTION) ×1 IMPLANT
PACK BASIN DAY SURGERY FS (CUSTOM PROCEDURE TRAY) ×2 IMPLANT
PENCIL BUTTON HOLSTER BLD 10FT (ELECTRODE) ×2 IMPLANT
SLEEVE SCD COMPRESS KNEE MED (MISCELLANEOUS) ×2 IMPLANT
SPONGE LAP 4X18 RFD (DISPOSABLE) ×2 IMPLANT
SUT MON AB 5-0 PS2 18 (SUTURE) ×3 IMPLANT
SUT VICRYL 3-0 CR8 SH (SUTURE) ×2 IMPLANT
SYR CONTROL 10ML LL (SYRINGE) ×3 IMPLANT
TOWEL GREEN STERILE FF (TOWEL DISPOSABLE) ×2 IMPLANT
TRAY FAXITRON CT DISP (TRAY / TRAY PROCEDURE) ×2 IMPLANT
TUBE CONNECTING 20X1/4 (TUBING) ×1 IMPLANT
YANKAUER SUCT BULB TIP NO VENT (SUCTIONS) ×1 IMPLANT

## 2018-10-27 NOTE — Op Note (Signed)
Preoperative Diagnosis: RIGHT BREAST CANCER  Postoprative Diagnosis: RIGHT BREAST CANCER  Procedure: Procedure(s): RIGHT BREAST LUMPECTOMY WITH RADIOACTIVE SEED AND SENTINEL LYMPH NODE BIOPSY   Surgeon: Excell Seltzer T   Assistants: None  Anesthesia:  General LMA anesthesia  Indications: 58 year old female with a new diagnosis of cancer of the right breast, upper outer quadrant. Clinical stage 1a, 0.7 cm, ER positive, PR positive, HER-2 negative.  After extensive preoperative work-up and discussion detailed elsewhere we have elected to proceed with radioactive seed localized right breast lumpectomy and axillary sentinel lymph node biopsy as initial surgical therapy.    Procedure Detail: In the holding area the patient underwent a pectoral block and underwent injection of 1 mCi of technetium sulfur colloid intradermally around the right nipple.  Seed placement was confirmed preoperatively.  Patient was brought to the operating room, placed in supine position on the operating table, and laryngeal mask general anesthesia induced.  The right breast and axilla and upper arm were widely sterilely prepped and draped.  She received preoperative IV antibiotics.  PAS were in place.  Patient timeout was performed and correct procedure verified. The lumpectomy was approached initially.  The seed and tumor were high in the right breast near the 12 o'clock position.  I used a superior circumareolar incision.  A skin and subcutaneous flap was then developed superiorly overlying the seed.  Using the neoprobe for guidance I then excised a globular approximately 2-1/2 cm specimen around the seed.  Breast tissue was grossly somewhat fibrous with history of previous reduction mammoplasty.  The specimen was removed and inked for margins.  3D specimen x-ray showed the seed displaced slightly anteriorly in the specimen but the marking clip and tumor appeared very centrally located and not abutting any of the edges.   The lumpectomy cavity was marked with clips and complete hemostasis obtained.  Deep breast and subcutaneous tissue was closed with interrupted 3-0 Vicryl. Attention was turned to the sentinel node.  A definite hot area in the right axilla was identified and a small transverse incision made in a skin crease.  Dissection was carried down through the subcutaneous tissue.  The clavipectoral fascia was incised.  Dissection was carried down onto a slightly enlarged but soft lymph node using the neoprobe for guidance.  This was excised with cautery and ex vivo had counts of 1600.  There was a second area just anterior to this that had some mildly elevated counts of about 120 that was excised.  I did not identify any definite lymph node in this.  At this point background of the axilla was about 20 and I could not feel any palpable adenopathy.  Hemostasis was assured in this incision.  The deep axillary and subcutaneous tissue was closed with interrupted 3-0 Vicryl.  Skin incisions were infiltrated with Marcaine and then closed with running subcuticular 5-0 Monocryl and Dermabond.  Sponge needle and instrument counts were correct.    Findings: As above  Estimated Blood Loss:  Minimal         Drains: None  Blood Given: none          Specimens: #1 right breast lumpectomy    #2 right axillary sentinel lymph nodes x2        Complications:  * No complications entered in OR log *         Disposition: PACU - hemodynamically stable.         Condition: stable

## 2018-10-27 NOTE — Progress Notes (Signed)
Assisted Dr. Oddono with right, ultrasound guided, pectoralis block. Side rails up, monitors on throughout procedure. See vital signs in flow sheet. Tolerated Procedure well. 

## 2018-10-27 NOTE — Anesthesia Procedure Notes (Signed)
Procedure Name: LMA Insertion Performed by: Verita Lamb, CRNA Pre-anesthesia Checklist: Patient identified, Emergency Drugs available, Suction available, Patient being monitored and Timeout performed Patient Re-evaluated:Patient Re-evaluated prior to induction Oxygen Delivery Method: Circle system utilized Preoxygenation: Pre-oxygenation with 100% oxygen Induction Type: IV induction LMA: LMA inserted LMA Size: 4.0 Placement Confirmation: positive ETCO2,  CO2 detector and breath sounds checked- equal and bilateral Tube secured with: Tape Dental Injury: Teeth and Oropharynx as per pre-operative assessment

## 2018-10-27 NOTE — Progress Notes (Signed)
Deaver with nuclear injection to right breast, pt tolerated procedure well.

## 2018-10-27 NOTE — Discharge Instructions (Signed)
[facility] °Office Phone Number [phone] ° °BREAST BIOPSY/ PARTIAL MASTECTOMY: POST OP INSTRUCTIONS ° °Always review your discharge instruction sheet given to you by the facility where your surgery was performed. ° °IF YOU HAVE DISABILITY OR FAMILY LEAVE FORMS, YOU MUST BRING THEM TO THE OFFICE FOR PROCESSING.  DO NOT GIVE THEM TO YOUR DOCTOR. ° °1. A prescription for pain medication may be given to you upon discharge.  Take your pain medication as prescribed, if needed.  If narcotic pain medicine is not needed, then you may take acetaminophen (Tylenol) or ibuprofen (Advil) as needed. °2. Take your usually prescribed medications unless otherwise directed °3. If you need a refill on your pain medication, please contact your pharmacy.  They will contact our office to request authorization.  Prescriptions will not be filled after 5pm or on week-ends. °4. You should eat very light the first 24 hours after surgery, such as soup, crackers, pudding, etc.  Resume your normal diet the day after surgery. °5. Most patients will experience some swelling and bruising in the breast.  Ice packs and a good support bra will help.  Swelling and bruising can take several days to resolve.  °6. It is common to experience some constipation if taking pain medication after surgery.  Increasing fluid intake and taking a stool softener will usually help or prevent this problem from occurring.  A mild laxative (Milk of Magnesia or Miralax) should be taken according to package directions if there are no bowel movements after 48 hours. °7. Unless discharge instructions indicate otherwise, you may remove your bandages 24-48 hours after surgery, and you may shower at that time.  You may have steri-strips (small skin tapes) in place directly over the incision.  These strips should be left on the skin for 7-10 days.  If your surgeon used skin glue on the incision, you may shower in 24 hours.  The glue will flake off over the  next 2-3 weeks.  Any sutures or staples will be removed at the office during your follow-up visit. °8. ACTIVITIES:  You may resume regular daily activities (gradually increasing) beginning the next day.  Wearing a good support bra or sports bra minimizes pain and swelling.  You may have sexual intercourse when it is comfortable. °a. You may drive when you no longer are taking prescription pain medication, you can comfortably wear a seatbelt, and you can safely maneuver your car and apply brakes. °b. RETURN TO WORK:  ______________________________________________________________________________________ °9. You should see your doctor in the office for a follow-up appointment approximately two weeks after your surgery.  Your doctor’s nurse will typically make your follow-up appointment when she calls you with your pathology report.  Expect your pathology report 2-3 business days after your surgery.  You may call to check if you do not hear from us after three days. °10. OTHER INSTRUCTIONS: _______________________________________________________________________________________________ _____________________________________________________________________________________________________________________________________ °_____________________________________________________________________________________________________________________________________ °_____________________________________________________________________________________________________________________________________ ° °WHEN TO CALL YOUR DOCTOR: °1. Fever over 101.0 °2. Nausea and/or vomiting. °3. Extreme swelling or bruising. °4. Continued bleeding from incision. °5. Increased pain, redness, or drainage from the incision. ° °The clinic staff is available to answer your questions during regular business hours.  Please don’t hesitate to call and ask to speak to one of the nurses for clinical concerns.  If you have a medical emergency, go to the nearest  emergency room or call 911.  A surgeon from [facility] is always on call at the hospital. ° °For further questions, please visit [email or web address]  ° ° ° °  Post Anesthesia Home Care Instructions  Activity: Get plenty of rest for the remainder of the day. A responsible individual must stay with you for 24 hours following the procedure.  For the next 24 hours, DO NOT: -Drive a car -Operate machinery -Drink alcoholic beverages -Take any medication unless instructed by your physician -Make any legal decisions or sign important papers.  Meals: Start with liquid foods such as gelatin or soup. Progress to regular foods as tolerated. Avoid greasy, spicy, heavy foods. If nausea and/or vomiting occur, drink only clear liquids until the nausea and/or vomiting subsides. Call your physician if vomiting continues.  Special Instructions/Symptoms: Your throat may feel dry or sore from the anesthesia or the breathing tube placed in your throat during surgery. If this causes discomfort, gargle with warm salt water. The discomfort should disappear within 24 hours.  If you had a scopolamine patch placed behind your ear for the management of post- operative nausea and/or vomiting:  1. The medication in the patch is effective for 72 hours, after which it should be removed.  Wrap patch in a tissue and discard in the trash. Wash hands thoroughly with soap and water. 2. You may remove the patch earlier than 72 hours if you experience unpleasant side effects which may include dry mouth, dizziness or visual disturbances. 3. Avoid touching the patch. Wash your hands with soap and water after contact with the patch.    Information for Discharge Teaching: EXPAREL (bupivacaine liposome injectable suspension)   Your surgeon or anesthesiologist gave you EXPAREL(bupivacaine) to help control your pain after surgery.   EXPAREL is a local anesthetic that provides pain relief by numbing the tissue around the  surgical site.  EXPAREL is designed to release pain medication over time and can control pain for up to 72 hours.  Depending on how you respond to EXPAREL, you may require less pain medication during your recovery.  Possible side effects:  Temporary loss of sensation or ability to move in the area where bupivacaine was injected.  Nausea, vomiting, constipation  Rarely, numbness and tingling in your mouth or lips, lightheadedness, or anxiety may occur.  Call your doctor right away if you think you may be experiencing any of these sensations, or if you have other questions regarding possible side effects.  Follow all other discharge instructions given to you by your surgeon or nurse. Eat a healthy diet and drink plenty of water or other fluids.  If you return to the hospital for any reason within 96 hours following the administration of EXPAREL, it is important for health care providers to know that you have received this anesthetic. A teal colored band has been placed on your arm with the date, time and amount of EXPAREL you have received in order to alert and inform your health care providers. Please leave this armband in place for the full 96 hours following administration, and then you may remove the band. 

## 2018-10-27 NOTE — Transfer of Care (Signed)
Immediate Anesthesia Transfer of Care Note  Patient: Heather Hayes  Procedure(s) Performed: RIGHT BREAST LUMPECTOMY WITH RADIOACTIVE SEED AND SENTINEL LYMPH NODE BIOPSY (Right Breast)  Patient Location: PACU  Anesthesia Type:General  Level of Consciousness: awake, alert  and oriented  Airway & Oxygen Therapy: Patient Spontanous Breathing and Patient connected to face mask oxygen  Post-op Assessment: Report given to RN and Post -op Vital signs reviewed and stable  Post vital signs: Reviewed and stable  Last Vitals:  Vitals Value Taken Time  BP 154/91 10/27/2018 11:51 AM  Temp    Pulse 87 10/27/2018 11:54 AM  Resp 16 10/27/2018 11:54 AM  SpO2 100 % 10/27/2018 11:54 AM  Vitals shown include unvalidated device data.  Last Pain:  Vitals:   10/27/18 0900  TempSrc:   PainSc: 0-No pain         Complications: No apparent anesthesia complications

## 2018-10-27 NOTE — Anesthesia Postprocedure Evaluation (Signed)
Anesthesia Post Note  Patient: Heather Hayes  Procedure(s) Performed: RIGHT BREAST LUMPECTOMY WITH RADIOACTIVE SEED AND SENTINEL LYMPH NODE BIOPSY (Right Breast)     Patient location during evaluation: PACU Anesthesia Type: General Level of consciousness: awake and alert Pain management: pain level controlled Vital Signs Assessment: post-procedure vital signs reviewed and stable Respiratory status: spontaneous breathing, nonlabored ventilation, respiratory function stable and patient connected to nasal cannula oxygen Cardiovascular status: blood pressure returned to baseline and stable Postop Assessment: no apparent nausea or vomiting Anesthetic complications: no    Last Vitals:  Vitals:   10/27/18 1151 10/27/18 1200  BP: (!) 154/91 120/68  Pulse:  82  Resp:  17  Temp:  37 C  SpO2:  97%    Last Pain:  Vitals:   10/27/18 1200  TempSrc:   PainSc: 0-No pain                 Adonnis Salceda

## 2018-10-27 NOTE — Anesthesia Procedure Notes (Addendum)
Anesthesia Regional Block: Pectoralis block   Pre-Anesthetic Checklist: ,, timeout performed, Correct Patient, Correct Site, Correct Laterality, Correct Procedure, Correct Position, site marked, Risks and benefits discussed,  Surgical consent,  Pre-op evaluation,  At surgeon's request and post-op pain management  Laterality: Right  Prep: chloraprep       Needles:  Injection technique: Single-shot  Needle Type: Echogenic Stimulator Needle     Needle Length: 5cm  Needle Gauge: 22     Additional Needles:   Procedures:, nerve stimulator,,, ultrasound used (permanent image in chart),,,,  Narrative:  Start time: 10/27/2018 9:01 AM End time: 10/27/2018 9:05 AM Injection made incrementally with aspirations every 5 mL.  Performed by: Personally  Anesthesiologist: [name] Riggers, MD  Additional Notes: Functioning IV was confirmed and monitors were applied.  A 18mm 22ga Arrow echogenic stimulator needle was used. Sterile prep and drape,hand hygiene and sterile gloves were used. Ultrasound guidance: relevant anatomy identified, needle position confirmed, local anesthetic spread visualized around nerve(s)., vascular puncture avoided.  Image printed for medical record. Negative aspiration and negative test dose prior to incremental administration of local anesthetic. The patient tolerated the procedure well.

## 2018-10-27 NOTE — Anesthesia Preprocedure Evaluation (Signed)
Anesthesia Evaluation  Patient identified by MRN, date of birth, ID band Patient awake    Reviewed: Allergy & Precautions, H&P , NPO status , Patient's Chart, lab work & pertinent test results, reviewed documented beta blocker date and time   Airway Mallampati: II  TM Distance: >3 FB Neck ROM: full    Dental no notable dental hx.    Pulmonary neg pulmonary ROS,    Pulmonary exam normal breath sounds clear to auscultation       Cardiovascular Exercise Tolerance: Good negative cardio ROS   Rhythm:regular Rate:Normal     Neuro/Psych negative neurological ROS  negative psych ROS   GI/Hepatic negative GI ROS, Neg liver ROS,   Endo/Other  negative endocrine ROS  Renal/GU negative Renal ROS  negative genitourinary   Musculoskeletal   Abdominal   Peds  Hematology negative hematology ROS (+)   Anesthesia Other Findings   Reproductive/Obstetrics negative OB ROS                             Anesthesia Physical Anesthesia Plan  ASA: II  Anesthesia Plan: General   Post-op Pain Management: GA combined w/ Regional for post-op pain   Induction:   PONV Risk Score and Plan: 3 and Ondansetron, Dexamethasone, Treatment may vary due to age or medical condition and Midazolam  Airway Management Planned: Oral ETT and LMA  Additional Equipment:   Intra-op Plan:   Post-operative Plan: Extubation in OR  Informed Consent: I have reviewed the patients History and Physical, chart, labs and discussed the procedure including the risks, benefits and alternatives for the proposed anesthesia with the patient or authorized representative who has indicated his/her understanding and acceptance.     Dental Advisory Given  Plan Discussed with: CRNA, Anesthesiologist and Surgeon  Anesthesia Plan Comments: (Discussed both nerve block for pain relief post-op and GA; including NV, sore throat, dental injury, and  pulmonary complications)        Anesthesia Quick Evaluation

## 2018-10-27 NOTE — Interval H&P Note (Signed)
History and Physical Interval Note:  10/27/2018 9:58 AM  Heather Hayes  has presented today for surgery, with the diagnosis of RIGHT BREAST CANCER.  The various methods of treatment have been discussed with the patient and family. After consideration of risks, benefits and other options for treatment, the patient has consented to  Procedure(s): RIGHT BREAST LUMPECTOMY WITH RADIOACTIVE SEED AND SENTINEL LYMPH NODE BIOPSY (Right) as a surgical intervention.  The patient's history has been reviewed, patient examined, no change in status, stable for surgery.  I have reviewed the patient's chart and labs.  Questions were answered to the patient's satisfaction.     Darene Lamer Laqueisha Catalina

## 2018-10-30 NOTE — Addendum Note (Signed)
Addendum  created 10/30/18 1212 by [name] Riggers, MD   Clinical Note Signed, Intraprocedure Blocks edited

## 2018-10-31 ENCOUNTER — Other Ambulatory Visit: Payer: Self-pay | Admitting: *Deleted

## 2018-10-31 ENCOUNTER — Encounter (HOSPITAL_BASED_OUTPATIENT_CLINIC_OR_DEPARTMENT_OTHER): Payer: Self-pay | Admitting: General Surgery

## 2018-10-31 DIAGNOSIS — Z170 Estrogen receptor positive status [ER+]: Secondary | ICD-10-CM

## 2018-10-31 DIAGNOSIS — C50411 Malignant neoplasm of upper-outer quadrant of right female breast: Secondary | ICD-10-CM

## 2018-10-31 NOTE — Addendum Note (Signed)
Addendum  created 10/31/18 0805 by Karmin Kasprzak, Ernesta Amble, CRNA   Charge Capture section accepted

## 2018-11-30 NOTE — Progress Notes (Signed)
Location of Breast Cancer: Malignant neoplasm of upper-outer quadrant of right breast in female, estrogen receptor positive (HCC)  Histology per Pathology Report: 10/27/18:  Diagnosis 1. Breast, lumpectomy, Right - INVASIVE DUCTAL CARCINOMA WITH CALCIFICATIONS, GRADE I/III, SPANNING 0.7 CM. - THE SURGICAL RESECTION MARGIN ARE NEGATIVE FOR CARCINOMA.   Receptor Status: ER(95%), PR (95%), Her2-neu (negative), Ki-(5%)  Did patient present with symptoms (if so, please note symptoms) or was this found on screening mammography?: This mass was found by screening mammogram. She has been having yearly mammograms and this is her first abnormal screening. She overall feels at baseline with no breast change.  Past/Anticipated interventions by surgeon, if any: 10/27/18:  Procedure: Procedure(s): RIGHT BREAST LUMPECTOMY WITH RADIOACTIVE SEED AND SENTINEL LYMPH NODE BIOPSY   Surgeon: Hoxworth, Benjamin T   Past/Anticipated interventions by medical oncology, if any: Chemotherapy Per Dr. Feng 10/11/18:  PLAN:  -Proceed with surgery soon  -will order oncotype if tumor more than 1 cm, or more than 5 mm with grade 2 or 3 -F/u likely after radiation or sooner if needed   Lymphedema issues, if any:  Pt denies  Pain issues, if any: Pt reports that breast/lumpectomy site is tender to touch. Pt reports occasional pain in inner upper arm related to nerve manipulation in surgery.  SAFETY ISSUES:  Prior radiation? No  Pacemaker/ICD? No  Possible current pregnancy? No  Is the patient on methotrexate? No  Current Complaints / other details:  Pt presents today for consult with Dr. Kinard for Radiation Oncology. Pt is unaccompanied.   BP 129/74 (BP Location: Left Arm, Patient Position: Sitting)   Pulse 60   Temp 98.7 F (37.1 C) (Oral)   Resp 18   Ht 5' 3" (1.6 m)   Wt 194 lb 6 oz (88.2 kg)   SpO2 100%   BMI 34.43 kg/m   Wt Readings from Last 3 Encounters:  12/04/18 194 lb 6 oz (88.2 kg)  10/27/18  194 lb 0.1 oz (88 kg)  10/11/18 192 lb 4.8 oz (87.2 kg)       Jill W Pfefferkorn, RN 12/04/2018,9:30 AM    

## 2018-12-04 ENCOUNTER — Other Ambulatory Visit: Payer: Self-pay

## 2018-12-04 ENCOUNTER — Ambulatory Visit
Admission: RE | Admit: 2018-12-04 | Discharge: 2018-12-04 | Disposition: A | Discharge status: Home / Self Care | Payer: 59 | Source: Ambulatory Visit | Attending: Hematology | Admitting: Hematology

## 2018-12-04 ENCOUNTER — Encounter: Payer: Self-pay | Admitting: Radiation Oncology

## 2018-12-04 ENCOUNTER — Ambulatory Visit
Admission: RE | Admit: 2018-12-04 | Discharge: 2018-12-04 | Disposition: A | Discharge status: Home / Self Care | Payer: 59 | Source: Ambulatory Visit | Attending: Radiation Oncology | Admitting: Radiation Oncology

## 2018-12-04 VITALS — BP 129/74 | HR 60 | Temp 98.7°F | Resp 18 | Ht 63.0 in | Wt 194.4 lb

## 2018-12-04 DIAGNOSIS — C50411 Malignant neoplasm of upper-outer quadrant of right female breast: Secondary | ICD-10-CM

## 2018-12-04 DIAGNOSIS — M79629 Pain in unspecified upper arm: Secondary | ICD-10-CM | POA: Diagnosis not present

## 2018-12-04 DIAGNOSIS — Z170 Estrogen receptor positive status [ER+]: Secondary | ICD-10-CM | POA: Insufficient documentation

## 2018-12-04 DIAGNOSIS — Z79899 Other long term (current) drug therapy: Secondary | ICD-10-CM | POA: Insufficient documentation

## 2018-12-04 DIAGNOSIS — Z9889 Other specified postprocedural states: Secondary | ICD-10-CM | POA: Diagnosis not present

## 2018-12-04 NOTE — Patient Instructions (Signed)
Coronavirus (COVID-19) Are you at risk?  Are you at risk for the Coronavirus (COVID-19)?  To be considered HIGH RISK for Coronavirus (COVID-19), you have to meet the following criteria:  . Traveled to [place], [place], [name], [place] or [place]; or in the [place] to [place], [place], [place], or [place]; and have fever, cough, and shortness of breath within the last 2 weeks of travel OR . Been in close contact with a person diagnosed with COVID-19 within the last 2 weeks and have fever, cough, and shortness of breath . IF YOU DO NOT MEET THESE CRITERIA, YOU ARE CONSIDERED LOW RISK FOR COVID-19.  What to do if you are HIGH RISK for COVID-19?  . If you are having a medical emergency, call 911. . Seek medical care right away. Before you go to a doctor's office, urgent care or emergency department, call ahead and tell them about your recent travel, contact with someone diagnosed with COVID-19, and your symptoms. You should receive instructions from your physician's office regarding next steps of care.  . When you arrive at healthcare provider, tell the healthcare staff immediately you have returned from visiting [place], [place], [place], [place] or [name]; or traveled in the [place] to [place], [place], [place], or [place]; in the last two weeks or you have been in close contact with a person diagnosed with COVID-19 in the last 2 weeks.   . Tell the health care staff about your symptoms: fever, cough and shortness of breath. . After you have been seen by a medical provider, you will be either: o Tested for (COVID-19) and discharged home on quarantine except to seek medical care if symptoms worsen, and asked to  - Stay home and avoid contact with others until you get your results (4-5 days)  - Avoid travel on public transportation if possible (such as bus, train, or airplane) or o Sent to the Emergency Department by EMS for evaluation, COVID-19 testing, and possible  admission depending on your condition and test results.  What to do if you are LOW RISK for COVID-19?  Reduce your risk of any infection by using the same precautions used for avoiding the common cold or flu:  . Wash your hands often with soap and warm water for at least 20 seconds.  If soap and water are not readily available, use an alcohol-based hand sanitizer with at least 60% alcohol.  . If coughing or sneezing, cover your mouth and nose by coughing or sneezing into the elbow areas of your shirt or coat, into a tissue or into your sleeve (not your hands). . Avoid shaking hands with others and consider head nods or verbal greetings only. . Avoid touching your eyes, nose, or mouth with unwashed hands.  . Avoid close contact with people who are sick. . Avoid places or events with large numbers of people in one location, like concerts or sporting events. . Carefully consider travel plans you have or are making. . If you are planning any travel outside or inside the [place], visit the CDC's Travelers' Health webpage for the latest health notices. . If you have some symptoms but not all symptoms, continue to monitor at home and seek medical attention if your symptoms worsen. . If you are having a medical emergency, call 911.   ADDITIONAL HEALTHCARE OPTIONS FOR PATIENTS  [facility] / e-Visit: [email or web address]         [facility]: [phone]  [facility]: [phone]                   [facility]: [phone]   

## 2018-12-04 NOTE — Progress Notes (Signed)
Radiation Oncology         [phone] ________________________________  Name: Heather Hayes MRN: [id number]  Date: 12/04/2018  DOB: Jul 23, 1961  Reevaluation Visit Note  CC: Molli Posey, MD  Truitt Merle, MD    ICD-10-CM   1. Malignant neoplasm of upper-outer quadrant of right breast in female, estrogen receptor positive ([place]) C50.411    Z17.0     Diagnosis:   Stage IA (pT1b, N0) right Breast UOQ Invasive Ductal Carcinoma, ER+ / PR+ / Her2-, Grade 1.   Narrative:  The patient returns today for reevaluation.  she is doing well overall. She was seen in Breast Clinic on 10/11/18. She returns today following surgical intervention to discuss radiation treatment options.   Since they were last seen in the office, they had right breast lumpectomy with SLN on 10/27/18 performed by Dr. Excell Seltzer. Pathology from this procedure showed invasive ductal carcinoma with calcifications, grade I/III, spanning 0.7 cm. The surgical resection margin was negative for carcinoma. There was no evidence of carcinoma in 2/2 lymph nodes.                 On review of systems, she reports that breast/lumpectomy site is tender to touch. Pt reports occasional pain in inner upper arm related to nerve manipulation in surgery. she denies symptoms of lymphedema and any other symptoms. Pertinent positives are listed and detailed within the above HPI.                  ALLERGIES:  is allergic to compazine [prochlorperazine edisylate].  Meds: Current Outpatient Medications  Medication Sig Dispense Refill  . clonazePAM (KLONOPIN) 0.5 MG tablet Take 0.5 mg by mouth 2 (two) times daily as needed for anxiety.    . meloxicam (MOBIC) 15 MG tablet Take 15 mg by mouth daily.    . traMADol (ULTRAM) 50 MG tablet Take 1 tablet (50 mg total) by mouth every 6 (six) hours as needed. 10 tablet 0   No current facility-administered medications for this encounter.     Physical Findings: The patient is in no acute distress. Patient is  alert and oriented.  height is '5\' 3"'  (1.6 m) and weight is 194 lb 6 oz (88.2 kg). Her oral temperature is 98.7 F (37.1 C). Her blood pressure is 129/74 and her pulse is 60. Her respiration is 18 and oxygen saturation is 100%. .  No significant changes. Lungs are clear to auscultation bilaterally. Heart has regular rate and rhythm. No palpable cervical, supraclavicular, or axillary adenopathy. Abdomen soft, non-tender, normal bowel sounds. Right breast with a peri-areolar scar in the upper inner quadrant of the breast. Some induration in the UIQ of the breast near lumpectomy site, suspicious for possible seroma. No signs of infection in the breast.  Lab Findings: Lab Results  Component Value Date   WBC 6.7 10/11/2018   HGB 14.2 10/11/2018   HCT 44.5 10/11/2018   MCV 95.3 10/11/2018   PLT 267 10/11/2018    Radiographic Findings: No results found.  Impression:  Patient would be a good candidate for breast conservation surgery with radiotherapy directed at the right breast. Given her early stage and low grade tumor she does not require adjuvant chemotherapy.   Plan:  She has opted to receive radiation treatment at the [facility] in [place]. Discussed with Dr. Orlene Erm today. Consult with Dr. Gatha Mayer at the [facility] later this week, PRN follow-up with radiation oncology in [place].  ____________________________________   Nelda Severe.  Sondra Come, PhD, MD    This document serves as a record of services personally performed by Gery Pray, MD. It was created on his behalf by Mary-Margaret Loma Messing, a trained medical scribe. The creation of this record is based on the scribe's personal observations and the provider's statements to them. This document has been checked and approved by the attending provider.

## 2018-12-07 DIAGNOSIS — C50911 Malignant neoplasm of unspecified site of right female breast: Secondary | ICD-10-CM | POA: Diagnosis not present

## 2018-12-08 ENCOUNTER — Telehealth: Payer: Self-pay | Admitting: Hematology

## 2018-12-08 NOTE — Telephone Encounter (Signed)
Scheduled f/u for July per sch msg. Called and spoke with patient. Confirmed date and time

## 2018-12-11 ENCOUNTER — Encounter: Payer: Self-pay | Admitting: *Deleted

## 2018-12-13 DIAGNOSIS — Z510 Encounter for antineoplastic radiation therapy: Secondary | ICD-10-CM | POA: Diagnosis not present

## 2018-12-13 DIAGNOSIS — C50911 Malignant neoplasm of unspecified site of right female breast: Secondary | ICD-10-CM | POA: Diagnosis not present

## 2018-12-14 DIAGNOSIS — Z510 Encounter for antineoplastic radiation therapy: Secondary | ICD-10-CM | POA: Diagnosis not present

## 2018-12-14 DIAGNOSIS — C50411 Malignant neoplasm of upper-outer quadrant of right female breast: Secondary | ICD-10-CM | POA: Diagnosis not present

## 2019-01-11 ENCOUNTER — Telehealth: Payer: Self-pay | Admitting: Hematology

## 2019-01-11 NOTE — Telephone Encounter (Signed)
Faxed medical records to Stefani Dama with [employer]. Release LR#[id number]

## 2019-01-19 ENCOUNTER — Telehealth: Payer: Self-pay | Admitting: *Deleted

## 2019-01-19 NOTE — Telephone Encounter (Signed)
On 01-19-19 fax medical records to [facility], it was the reevaluation note

## 2019-02-22 NOTE — Progress Notes (Signed)
[place]   Telephone:(336) [phone] [phone]) [phone]   Clinic Follow up Note   Patient Care Team: Molli Posey, MD as PCP - General (Obstetrics and Gynecology) Mauro Kaufmann, RN as Oncology Nurse Navigator Rockwell Germany, RN as Oncology Nurse Navigator Excell Seltzer, MD as Consulting Physician (General Surgery) Truitt Merle, MD as Consulting Physician (Hematology) Gery Pray, MD as Consulting Physician (Radiation Oncology)  Date of Service:  02/26/2019  CHIEF COMPLAINT: F/u of right breast cancer  SUMMARY OF ONCOLOGIC HISTORY: Oncology History Overview Note  Cancer Staging Malignant neoplasm of upper-outer quadrant of right breast in female, estrogen receptor positive ([place]) Staging form: Breast, AJCC 8th Edition - Clinical stage from 10/02/2018: Stage IA (cT1b, cN0, cM0, G2, ER+, PR+, HER2-) - Signed by Truitt Merle, MD on 10/10/2018 - Pathologic stage from 10/27/2018: Stage IA (pT1b, pN0, cM0, G1, ER+, PR+, HER2-) - Signed by Truitt Merle, MD on 02/26/2019     Malignant neoplasm of upper-outer quadrant of right breast in female, estrogen receptor positive ([place])  09/27/2018 Mammogram   [facility] M[facility] 09/27/18  IMPRESSION: 1. Suspicious right breast mass measures 7 x 7 x 6 mm with associated vascularity at the 12 o'clock position 7 cm from the nipple. 2. No suspicious right axillary lymphadenopathy.   10/02/2018 Cancer Staging   Staging form: Breast, AJCC 8th Edition - Clinical stage from 10/02/2018: Stage IA (cT1b, cN0, cM0, G2, ER+, PR+, HER2-) - Signed by Truitt Merle, MD on 10/10/2018   10/02/2018 Initial Biopsy   Diagnosis 10/02/18 Breast, right, needle core biopsy, 12 o'clock - INVASIVE DUCTAL CARCINOMA.   10/02/2018 Receptors her2   Results: IMMUNOHISTOCHEMICAL AND MORPHOMETRIC ANALYSIS PERFORMED MANUALLY The tumor cells are NEGATIVE for Her2 (1+). Estrogen Receptor: 95%, POSITIVE, STRONG STAINING INTENSITY Progesterone Receptor: 95%, POSITIVE, STRONG  STAINING INTENSITY Proliferation Marker Ki67: 5%   10/04/2018 Initial Diagnosis   Malignant neoplasm of upper-outer quadrant of right breast in female, estrogen receptor positive ([place])   10/27/2018 Surgery   RIGHT BREAST LUMPECTOMY WITH RADIOACTIVE SEED AND SENTINEL LYMPH NODE BIOPSY by Dr Excell Seltzer   10/27/2018 Pathology Results   Diagnosis 10/27/18 1. Breast, lumpectomy, Right - INVASIVE DUCTAL CARCINOMA WITH CALCIFICATIONS, GRADE I/III, SPANNING 0.7 CM. - THE SURGICAL RESECTION MARGIN ARE NEGATIVE FOR CARCINOMA. - SEE ONCOLOGY TABLE BELOW. 2. Lymph node, sentinel, biopsy, Right - THERE IS NO EVIDENCE OF CARCINOMA IN 1 OF 1 LYMPH NODE (0/1). 3. Lymph node, sentinel, biopsy, Right - THERE IS NO EVIDENCE OF CARCINOMA IN 1 OF 1 LYMPH NODE (0/1).    - 01/29/2019 Radiation Therapy   Radiation therapy at [facility] on 01/29/19.     Anti-estrogen oral therapy   Anastrozole 95m daily starting in 03/2019    10/27/2018 Cancer Staging   Staging form: Breast, AJCC 8th Edition - Pathologic stage from 10/27/2018: Stage IA (pT1b, pN0, cM0, G1, ER+, PR+, HER2-) - Signed by FTruitt Merle MD on 02/26/2019      CURRENT THERAPY:  Anastrozole 127mdaily starting this week   INTERVAL HISTORY:  Heather Hayes here for a follow up. [facility]. She has been recovering well. She notes her hyperpigmentation and mild skin breakdown improved. She notes she had shooting breast pain and fatigue during treatment but resolved.   After endometrial ablation she has not had period. She notes after being on hormonal replacement for 3 years she stopped and has been having hot flashes again. She is not sure if her hormonal levels were  checked before. She is interested in [place].    REVIEW OF SYSTEMS:   Constitutional: Denies fevers, chills or abnormal weight loss Eyes: Denies blurriness of vision Ears, nose, mouth, throat, and face: Denies mucositis or sore throat Respiratory: Denies cough,  dyspnea or wheezes Cardiovascular: Denies palpitation, chest discomfort or lower extremity swelling Gastrointestinal:  Denies nausea, heartburn or change in bowel habits Skin: Denies abnormal skin rashes Lymphatics: Denies new lymphadenopathy or easy bruising Neurological:Denies numbness, tingling or new weaknesses Behavioral/Psych: Mood is stable, no new changes  Breast: (+) Right breast seroma or scar tissue  All other systems were reviewed with the patient and are negative.  MEDICAL HISTORY:  Past Medical History:  Diagnosis Date  . Anxiety   . Arthritis     SURGICAL HISTORY: Past Surgical History:  Procedure Laterality Date  . BREAST LUMPECTOMY WITH RADIOACTIVE SEED AND SENTINEL LYMPH NODE BIOPSY Right 10/27/2018   Procedure: RIGHT BREAST LUMPECTOMY WITH RADIOACTIVE SEED AND SENTINEL LYMPH NODE BIOPSY;  Surgeon: Excell Seltzer, MD;  Location: [place];  Service: General;  Laterality: Right;  . CERVICAL SPINE SURGERY  2009  . TUBAL LIGATION    . tube ligation       I have reviewed the social history and family history with the patient and they are unchanged from previous note.  ALLERGIES:  is allergic to compazine [prochlorperazine edisylate].  MEDICATIONS:  Current Outpatient Medications  Medication Sig Dispense Refill  . clonazePAM (KLONOPIN) 0.5 MG tablet Take 0.5 mg by mouth 2 (two) times daily as needed for anxiety.    . meloxicam (MOBIC) 15 MG tablet Take 15 mg by mouth daily.    Marland Kitchen anastrozole (ARIMIDEX) 1 MG tablet Take 1 tablet (1 mg total) by mouth daily. 30 tablet 5   No current facility-administered medications for this visit.     PHYSICAL EXAMINATION: ECOG PERFORMANCE STATUS: 0 - Asymptomatic  Vitals:   02/26/19 0936  BP: 120/66  Pulse: 68  Resp: 18  Temp: 97.8 F (36.6 C)  SpO2: 100%   Filed Weights   02/26/19 0936  Weight: 197 lb 14.4 oz (89.8 kg)    GENERAL:alert, no distress and comfortable SKIN: skin color, texture,  turgor are normal, no rashes or significant lesions EYES: normal, Conjunctiva are pink and non-injected, sclera clear  NECK: supple, thyroid normal size, non-tender, without nodularity LYMPH:  no palpable lymphadenopathy in the cervical, axillary  LUNGS: clear to auscultation and percussion with normal breathing effort HEART: regular rate & rhythm and no murmurs and no lower extremity edema ABDOMEN:abdomen soft, non-tender and normal bowel sounds Musculoskeletal:no cyanosis of digits and no clubbing  NEURO: alert & oriented x 3 with fluent speech, no focal motor/sensory deficits BREAST: (+) S/p right lumpectomy: Surgical incision healed well (+) 1x2cm Seroma or scar tissue at 12-1:00 position of right breast, mildly tender (+) Mild skin hyperpigmentation of right breast. No palpable mass, nodules or adenopathy bilaterally. Breast exam benign.   LABORATORY DATA:  I have reviewed the data as listed CBC Latest Ref Rng & Units 02/26/2019 10/11/2018 09/06/2007  WBC 4.0 - 10.5 K/uL 4.9 6.7 6.3  Hemoglobin 12.0 - 15.0 g/dL 13.2 14.2 12.2  Hematocrit 36.0 - 46.0 % 40.8 44.5 36.2  Platelets 150 - 400 K/uL 226 267 284     CMP Latest Ref Rng & Units 02/26/2019 10/11/2018 09/06/2007  Glucose 70 - 99 mg/dL 88 75 103(H)  BUN 6 - 20 mg/dL '14 18 9  ' Creatinine 0.44 - 1.00 mg/dL 0.75  0.81 0.59  Sodium 135 - 145 mmol/L 139 142 136  Potassium 3.5 - 5.1 mmol/L 4.4 4.3 3.9  Chloride 98 - 111 mmol/L 107 105 102  CO2 22 - 32 mmol/L '26 26 28  ' Calcium 8.9 - 10.3 mg/dL 9.4 10.1 9.4  Total Protein 6.5 - 8.1 g/dL 7.5 8.5(H) -  Total Bilirubin 0.3 - 1.2 mg/dL 0.3 0.7 -  Alkaline Phos 38 - 126 U/L 81 95 -  AST 15 - 41 U/L 16 15 -  ALT 0 - 44 U/L 14 11 -      RADIOGRAPHIC STUDIES: I have personally reviewed the radiological images as listed and agreed with the findings in the report. No results found.   ASSESSMENT & PLAN:  Heather Hayes is a 58 y.o. female with   1. Malignant neoplasm of upper-outer quadrant  of right breast, Stage IA, p (T1b,N0,M0), ER/PR+, HER2-, Grade II -She was diagnosed in 10/2018. She underwent right lumpectomy with SLNB on 10/27/18. Her surgical pathology showed complete resection, negative margins, node negative.  -Given low grade T1b ER/PR strongly positive disease, I did not recommend Oncotype  -She completed Rad[facility] at [facility] on 01/29/19.  -I discussed given the strong ER and PR expression, I recommend adjuvant endocrine therapy. After endometrial ablation years ago she has not had another period. She was on hormonal replacement for 3 years and has stopped and understands she will not restart. Since d/c her hot flashes restarted.  -Will obtain baseline labs today including her [place] to confirm she is postmenopausal.  -I discussed anti-estrogen options of anastrozole if she is post-menopausal   --The potential benefit and side effects, which includes but not limited to, hot flash, skin and vaginal dryness, metabolic changes ( increased blood glucose, cholesterol, weight, etc.), slightly in increased risk of cardiovascular disease, cataracts, muscular and joint discomfort, osteopenia and osteoporosis, etc, were discussed with her in great details. Will start based on lab results today.  -If her mood swing or hot flashes are severe I can call in SSRI to manage  -We also discussed the breast cancer surveillance after her surgery. She will continue annual screening mammogram, self exam, and a routine office visit with lab and exam with [name]. -I also discussed the opportunity of survivorship clinic for next visit. She is interested. Plan for 5 months. -F/u with me in 8 months    2. Bone Health  -Her 10/25/17 DEXA was normal with T-score 0.1 of b/l hips.  -I discussed Anastrozole can reduce her bone density. Will monitor with DEXA every 2 years.    3. Weight Gain  -She is concerned with obesity and health complications from it.  -I discussed AI can slow down her  metabolism and contribute to weight gain when she is on it.  -I reviewed briefly weight management with diet low in carbohydrate and increase exercise multiple times a week.  -I encouraged her to watch for her cholesterol, BG and BP with her PCP.    4. She had genetic testing in 2019 with Dr. Matthew Saras which was negative.      PLAN:  -Lab today -I called in Anastrozole today  -Virtual visit in 2-3 months with me  -Survivorship clinic in 5 months  -Lab and f/u with me in 8 months    No problem-specific Assessment & Plan notes found for this encounter.   Orders Placed This Encounter  Procedures  . CBC with Differential (Cancer Center Only)    Standing Status:  Standing    Number of Occurrences:   10    Standing Expiration Date:   02/26/2020  . CMP ([place] only)    Standing Status:   Standing    Number of Occurrences:   10    Standing Expiration Date:   02/26/2020  . FSH-Follicle stimulating hormone    Standing Status:   Future    Standing Expiration Date:   02/26/2020   All questions were answered. The patient knows to call the clinic with any problems, questions or concerns. No barriers to learning was detected. I spent 20 minutes counseling the patient face to face. The total time spent in the appointment was 25 minutes and more than 50% was on counseling and review of test results     Truitt Merle, MD 02/26/2019   I, Joslyn Devon, am acting as scribe for Truitt Merle, MD.   I have reviewed the above documentation for accuracy and completeness, and I agree with the above.

## 2019-02-26 ENCOUNTER — Encounter: Payer: Self-pay | Admitting: Hematology

## 2019-02-26 ENCOUNTER — Inpatient Hospital Stay: Payer: 59 | Attending: Hematology | Admitting: Hematology

## 2019-02-26 ENCOUNTER — Other Ambulatory Visit: Payer: Self-pay

## 2019-02-26 ENCOUNTER — Telehealth: Payer: Self-pay | Admitting: Hematology

## 2019-02-26 ENCOUNTER — Inpatient Hospital Stay: Payer: 59

## 2019-02-26 VITALS — BP 120/66 | HR 68 | Temp 97.8°F | Resp 18 | Ht 63.0 in | Wt 197.9 lb

## 2019-02-26 DIAGNOSIS — Z170 Estrogen receptor positive status [ER+]: Secondary | ICD-10-CM | POA: Diagnosis not present

## 2019-02-26 DIAGNOSIS — N951 Menopausal and female climacteric states: Secondary | ICD-10-CM | POA: Insufficient documentation

## 2019-02-26 DIAGNOSIS — Z923 Personal history of irradiation: Secondary | ICD-10-CM | POA: Diagnosis not present

## 2019-02-26 DIAGNOSIS — Z791 Long term (current) use of non-steroidal anti-inflammatories (NSAID): Secondary | ICD-10-CM | POA: Diagnosis not present

## 2019-02-26 DIAGNOSIS — E669 Obesity, unspecified: Secondary | ICD-10-CM | POA: Diagnosis not present

## 2019-02-26 DIAGNOSIS — Z79899 Other long term (current) drug therapy: Secondary | ICD-10-CM | POA: Diagnosis not present

## 2019-02-26 DIAGNOSIS — C50411 Malignant neoplasm of upper-outer quadrant of right female breast: Secondary | ICD-10-CM

## 2019-02-26 DIAGNOSIS — F419 Anxiety disorder, unspecified: Secondary | ICD-10-CM | POA: Insufficient documentation

## 2019-02-26 DIAGNOSIS — Z79811 Long term (current) use of aromatase inhibitors: Secondary | ICD-10-CM | POA: Insufficient documentation

## 2019-02-26 LAB — CMP (CANCER CENTER ONLY)
ALT: 14 U/L (ref 0–44)
AST: 16 U/L (ref 15–41)
Albumin: 3.8 g/dL (ref 3.5–5.0)
Alkaline Phosphatase: 81 U/L (ref 38–126)
Anion gap: 6 (ref 5–15)
BUN: 14 mg/dL (ref 6–20)
CO2: 26 mmol/L (ref 22–32)
Calcium: 9.4 mg/dL (ref 8.9–10.3)
Chloride: 107 mmol/L (ref 98–111)
Creatinine: 0.75 mg/dL (ref 0.44–1.00)
GFR, Est AFR Am: >60 mL/min (ref >60)
GFR, Estimated: >60 mL/min (ref >60)
Glucose, Bld: 88 mg/dL (ref 70–99)
Potassium: 4.4 mmol/L (ref 3.5–5.1)
Sodium: 139 mmol/L (ref 135–145)
Total Bilirubin: 0.3 mg/dL (ref 0.3–1.2)
Total Protein: 7.5 g/dL (ref 6.5–8.1)

## 2019-02-26 LAB — CBC WITH DIFFERENTIAL (CANCER CENTER ONLY)
Abs Immature Granulocytes: 0.01 10*3/uL (ref 0.00–0.07)
Basophils Absolute: 0.1 10*3/uL (ref 0.0–0.1)
Basophils Relative: 1 %
Eosinophils Absolute: 0.1 10*3/uL (ref 0.0–0.5)
Eosinophils Relative: 3 %
HCT: 40.8 % (ref 36.0–46.0)
Hemoglobin: 13.2 g/dL (ref 12.0–15.0)
Immature Granulocytes: 0 %
Lymphocytes Relative: 25 %
Lymphs Abs: 1.2 10*3/uL (ref 0.7–4.0)
MCH: 30.5 pg (ref 26.0–34.0)
MCHC: 32.4 g/dL (ref 30.0–36.0)
MCV: 94.2 fL (ref 80.0–100.0)
Monocytes Absolute: 0.4 10*3/uL (ref 0.1–1.0)
Monocytes Relative: 8 %
Neutro Abs: 3.1 10*3/uL (ref 1.7–7.7)
Neutrophils Relative %: 63 %
Platelet Count: 226 10*3/uL (ref 150–400)
RBC: 4.33 MIL/uL (ref 3.87–5.11)
RDW: 11.8 % (ref 11.5–15.5)
WBC Count: 4.9 10*3/uL (ref 4.0–10.5)
nRBC: 0 % (ref 0.0–0.2)

## 2019-02-26 MED ORDER — ANASTROZOLE 1 MG PO TABS: 1.0000 mg | ORAL_TABLET | Freq: Every day | ORAL | 5 refills | Status: DC

## 2019-02-26 NOTE — Telephone Encounter (Signed)
Scheduled per los. Patient declined printout  

## 2019-02-27 ENCOUNTER — Encounter: Payer: Self-pay | Admitting: Hematology

## 2019-02-27 ENCOUNTER — Encounter: Payer: Self-pay | Admitting: *Deleted

## 2019-03-05 ENCOUNTER — Other Ambulatory Visit: Payer: Self-pay | Admitting: Hematology

## 2019-03-05 DIAGNOSIS — C50411 Malignant neoplasm of upper-outer quadrant of right female breast: Secondary | ICD-10-CM

## 2019-03-05 DIAGNOSIS — Z170 Estrogen receptor positive status [ER+]: Secondary | ICD-10-CM

## 2019-03-07 ENCOUNTER — Telehealth: Payer: Self-pay

## 2019-03-07 NOTE — Telephone Encounter (Signed)
Left voice message for this patient regarding lab results.  Per Dr. Burr Medico labs from last week were WNL, no concerns.

## 2019-03-07 NOTE — Telephone Encounter (Signed)
-----   Message from Truitt Merle, MD sent at 03/06/2019  9:46 AM EDT ----- Please let pt know all her lab from last week were WNL, no concerns, thanks   Truitt Merle  03/06/2019

## 2019-04-04 ENCOUNTER — Encounter: Payer: Self-pay | Admitting: Gastroenterology

## 2019-05-01 ENCOUNTER — Telehealth: Payer: Self-pay | Admitting: *Deleted

## 2019-05-01 NOTE — Telephone Encounter (Signed)
Proceed with colonoscopy. I do believe that she also has family history of colonic polyps   Thx  RG

## 2019-05-01 NOTE — Telephone Encounter (Signed)
Dr Lyndel Safe,  This pt is scheduled for a colon with you 10-23 Friday. Her last colon was 05-23-2014, she had 2  polyps removed proximal and mid ascending colon  But both  were benign colonic mucosa with intramucosal lymphoid aggregates .  She was diagnosed with Right breast cancer 10/2018, she had a lumpectomy 10-27-2018 with completion of radiation 01-29-2019.  Is she due for a repeat colon now or 2025?  Is she ok to proceed due to her Breast cancer at this point ?  Please advise, Thanks so much for your time,Heather Hayes

## 2019-05-02 NOTE — Telephone Encounter (Signed)
Noted. Thanks.

## 2019-05-04 NOTE — Progress Notes (Signed)
[place]   [phone] [phone]   Clinic Follow up Note   Patient Care Team: Molli Posey, MD as PCP - General (Obstetrics and Gynecology) Mauro Kaufmann, RN as Oncology Nurse Navigator Rockwell Germany, RN as Oncology Nurse Navigator Excell Seltzer, MD as Consulting Physician (General Surgery) Truitt Merle, MD as Consulting Physician (Hematology) Gery Pray, MD as Consulting Physician (Radiation Oncology)   Date of Service:  05/09/2019   CHIEF COMPLAINT:  F/u of right breast cancer  SUMMARY OF ONCOLOGIC HISTORY: Oncology History Overview Note  Cancer Staging Malignant neoplasm of upper-outer quadrant of right breast in female, estrogen receptor positive ([place]) Staging form: Breast, AJCC 8th Edition - Clinical stage from 10/02/2018: Stage IA (cT1b, cN0, cM0, G2, ER+, PR+, HER2-) - Signed by Truitt Merle, MD on 10/10/2018 - Pathologic stage from 10/27/2018: Stage IA (pT1b, pN0, cM0, G1, ER+, PR+, HER2-) - Signed by Truitt Merle, MD on 02/26/2019     Malignant neoplasm of upper-outer quadrant of right breast in female, estrogen receptor positive ([place])  09/27/2018 Mammogram   [facility] Mammogram 09/27/18  IMPRESSION: 1. Suspicious right breast mass measures 7 x 7 x 6 mm with associated vascularity at the 12 o'clock position 7 cm from the nipple. 2. No suspicious right axillary lymphadenopathy.   10/02/2018 Cancer Staging   Staging form: Breast, AJCC 8th Edition - Clinical stage from 10/02/2018: Stage IA (cT1b, cN0, cM0, G2, ER+, PR+, HER2-) - Signed by Truitt Merle, MD on 10/10/2018   10/02/2018 Initial Biopsy   Diagnosis 10/02/18 Breast, right, needle core biopsy, 12 o'clock - INVASIVE DUCTAL CARCINOMA.   10/02/2018 Receptors her2   Results: IMMUNOHISTOCHEMICAL AND MORPHOMETRIC ANALYSIS PERFORMED MANUALLY The tumor cells are NEGATIVE for Her2 (1+). Estrogen Receptor: 95%, POSITIVE, STRONG STAINING INTENSITY Progesterone Receptor: 95%, POSITIVE,  STRONG STAINING INTENSITY Proliferation Marker Ki67: 5%   10/04/2018 Initial Diagnosis   Malignant neoplasm of upper-outer quadrant of right breast in female, estrogen receptor positive ([place])   10/27/2018 Surgery   RIGHT BREAST LUMPECTOMY WITH RADIOACTIVE SEED AND SENTINEL LYMPH NODE BIOPSY by Dr Excell Seltzer   10/27/2018 Pathology Results   Diagnosis 10/27/18 1. Breast, lumpectomy, Right - INVASIVE DUCTAL CARCINOMA WITH CALCIFICATIONS, GRADE I/III, SPANNING 0.7 CM. - THE SURGICAL RESECTION MARGIN ARE NEGATIVE FOR CARCINOMA. - SEE ONCOLOGY TABLE BELOW. 2. Lymph node, sentinel, biopsy, Right - THERE IS NO EVIDENCE OF CARCINOMA IN 1 OF 1 LYMPH NODE (0/1). 3. Lymph node, sentinel, biopsy, Right - THERE IS NO EVIDENCE OF CARCINOMA IN 1 OF 1 LYMPH NODE (0/1).    - 01/29/2019 Radiation Therapy   Radiation therapy at [facility] on 01/29/19.    10/27/2018 Cancer Staging   Staging form: Breast, AJCC 8th Edition - Pathologic stage from 10/27/2018: Stage IA (pT1b, pN0, cM0, G1, ER+, PR+, HER2-) - Signed by Truitt Merle, MD on 02/26/2019   03/2019 -  Anti-estrogen oral therapy   Anastrozole '1mg'$  daily starting in 03/2019       CURRENT THERAPY:  Anastrozole '1mg'$  daily starting 03/2019  INTERVAL HISTORY:  Heather Hayes is here for a follow up of right breast cancer. She notes she is doing well. She is taking anastrozole and has experienced random dreams at night which have not impacted her sleeping. She has hot flashes mostly when outside and mostly at night. This also has not effected her sleep. She notes she also has experienced heartburn and constipation. Mild left trapezius muscle pain previously. She had mood swings lately and has dry  mouth upon waking up. Overall these symptoms are currently manageable. She plans to start with weight management clinic.  From surgery she has right ROM pulling in certain positions.    REVIEW OF SYSTEMS:   Constitutional: Denies fevers, chills or abnormal weight  loss (+) Hot flashes Eyes: Denies blurriness of vision Ears, nose, mouth, throat, and face: Denies mucositis or sore throat Respiratory: Denies cough, dyspnea or wheezes Cardiovascular: Denies palpitation, chest discomfort or lower extremity swelling Gastrointestinal:  (+) Heartburn (+) Constipation Skin: Denies abnormal skin rashes MSK:(+) Mild muscle pain Lymphatics: Denies new lymphadenopathy or easy bruising Neurological:Denies numbness, tingling or new weaknesses Behavioral/Psych: Mood is stable, no new changes (+) Mild mood swings  All other systems were reviewed with the patient and are negative.  MEDICAL HISTORY:  Past Medical History:  Diagnosis Date  . Anxiety   . Arthritis     SURGICAL HISTORY: Past Surgical History:  Procedure Laterality Date  . BREAST LUMPECTOMY WITH RADIOACTIVE SEED AND SENTINEL LYMPH NODE BIOPSY Right 10/27/2018   Procedure: RIGHT BREAST LUMPECTOMY WITH RADIOACTIVE SEED AND SENTINEL LYMPH NODE BIOPSY;  Surgeon: Excell Seltzer, MD;  Location: [place];  Service: General;  Laterality: Right;  . CERVICAL SPINE SURGERY  2009  . TUBAL LIGATION    . tube ligation       I have reviewed the social history and family history with the patient and they are unchanged from previous note.  ALLERGIES:  is allergic to compazine [prochlorperazine edisylate].  MEDICATIONS:  Current Outpatient Medications  Medication Sig Dispense Refill  . anastrozole (ARIMIDEX) 1 MG tablet Take 1 tablet (1 mg total) by mouth daily. 30 tablet 5  . clonazePAM (KLONOPIN) 0.5 MG tablet Take 0.5 mg by mouth 2 (two) times daily as needed for anxiety.    . meloxicam (MOBIC) 15 MG tablet Take 15 mg by mouth daily.     No current facility-administered medications for this visit.     PHYSICAL EXAMINATION: ECOG PERFORMANCE STATUS: 0 - Asymptomatic  Vitals:   05/09/19 0932  BP: (!) 131/91  Pulse: 77  Resp: 18  Temp: 98.3 F (36.8 C)  SpO2: 99%   Filed  Weights   05/09/19 0932  Weight: 194 lb 12.8 oz (88.4 kg)    GENERAL:alert, no distress and comfortable SKIN: skin color, texture, turgor are normal, no rashes or significant lesions EYES: normal, Conjunctiva are pink and non-injected, sclera clear  NECK: supple, thyroid normal size, non-tender, without nodularity LYMPH:  no palpable lymphadenopathy in the cervical, axillary  LUNGS: clear to auscultation and percussion with normal breathing effort HEART: regular rate & rhythm and no murmurs and no lower extremity edema ABDOMEN:abdomen soft, non-tender and normal bowel sounds Musculoskeletal:no cyanosis of digits and no clubbing  NEURO: alert & oriented x 3 with fluent speech, no focal motor/sensory deficits BREAST: S/p right lumpectomy: surgical incision healed well (+) Scar tissue 1-2cm lump in 12-1:00 position. No palpable adenopathy bilaterally. Breast exam benign.   LABORATORY DATA:  I have reviewed the data as listed CBC Latest Ref Rng & Units 02/26/2019 10/11/2018 09/06/2007  WBC 4.0 - 10.5 K/uL 4.9 6.7 6.3  Hemoglobin 12.0 - 15.0 g/dL 13.2 14.2 12.2  Hematocrit 36.0 - 46.0 % 40.8 44.5 36.2  Platelets 150 - 400 K/uL 226 267 284     CMP Latest Ref Rng & Units 02/26/2019 10/11/2018 09/06/2007  Glucose 70 - 99 mg/dL 88 75 103(H)  BUN 6 - 20 mg/dL '14 18 9  '$ Creatinine 0.44 -  1.00 mg/dL 0.75 0.81 0.59  Sodium 135 - 145 mmol/L 139 142 136  Potassium 3.5 - 5.1 mmol/L 4.4 4.3 3.9  Chloride 98 - 111 mmol/L 107 105 102  CO2 22 - 32 mmol/L '26 26 28  '$ Calcium 8.9 - 10.3 mg/dL 9.4 10.1 9.4  Total Protein 6.5 - 8.1 g/dL 7.5 8.5(H) -  Total Bilirubin 0.3 - 1.2 mg/dL 0.3 0.7 -  Alkaline Phos 38 - 126 U/L 81 95 -  AST 15 - 41 U/L 16 15 -  ALT 0 - 44 U/L 14 11 -      RADIOGRAPHIC STUDIES: I have personally reviewed the radiological images as listed and agreed with the findings in the report. No results found.   ASSESSMENT & PLAN:  SACHIKO METHOT is a 58 y.o. female with   1.Malignant  neoplasm of upper-outer quadrant of right breast, StageIA, p (T1b,N0,M0), ER/PR+,HER2-,Grade II -She was diagnosed in 10/2018. She underwent right lumpectomy with SLNB on 10/27/18. Her surgical pathology showed complete resection, negative margins, node negative.  -Given low grade T1b ER/PR strongly positive disease, I did not recommend Oncotype  -She completed Rad[facility] at [facility] on 01/29/19.  -She started antiestrogen therapy with Anastrozole in 03/2019. She has experienced random dream, hot flashes, dry mouth, heartburn, constipation, mild muscle pain and mild mood swings. Overall tolerable currently, will continue  -She is clinically doing well. Her physical exam was unremarkable with moderate scar tissue. There is no clinical concern for recurrence. -Continue surveillance. Next mammogram in 3/201  -Continue anastrozole  -Survivorship in 07/2019. F/u 10/2019  2. Bone Health  -Her 10/25/17 DEXA was normal with T-score 0.1 of b/l hips.  -I discussed Anastrozole can reduce her bone density. Will monitor with DEXA every 2 years.    3. Weight Gain  -She is concerned with obesity and health complications from it.  -I discussed AI can slow down her metabolism and contribute to weight gain when she is on it.  -I previously reviewed weight management with diet low in carbohydrate and increase exercise multiple times a week. -I encouraged her to watch for her cholesterol, BG and BP with her PCP.  -She plans to start with the weight management clinic this month.    4. She had genetic testingin 2019 with Dr. Lessie Dings was negative.     PLAN: -She is clinically doing well  -Continue Anastrozole  -Survivorship in 07/2019 -Lab and f/u in 10/2019 -Mammogram in 10/2019  No problem-specific Assessment & Plan notes found for this encounter.   Orders Placed This Encounter  Procedures  . MM DIAG BREAST TOMO BILATERAL    Standing Status:   Future    Standing  Expiration Date:   05/08/2020    Order Specific Question:   Reason for Exam (SYMPTOM  OR DIAGNOSIS REQUIRED)    Answer:   SCREENING    Order Specific Question:   Is the patient pregnant?    Answer:   No    Order Specific Question:   Preferred imaging location?    Answer:   [facility]    All questions were answered. The patient knows to call the clinic with any problems, questions or concerns. No barriers to learning was detected. I spent 15 minutes counseling the patient face to face. The total time spent in the appointment was 20 minutes and more than 50% was on counseling and review of test results    Truitt Merle, MD 05/09/2019   I, Joslyn Devon, am acting  as scribe for Truitt Merle, MD.   I have reviewed the above documentation for accuracy and completeness, and I agree with the above.

## 2019-05-09 ENCOUNTER — Other Ambulatory Visit: Payer: Self-pay

## 2019-05-09 ENCOUNTER — Encounter: Payer: Self-pay | Admitting: Hematology

## 2019-05-09 ENCOUNTER — Inpatient Hospital Stay: Payer: 59 | Attending: Hematology | Admitting: Hematology

## 2019-05-09 VITALS — BP 131/91 | HR 77 | Temp 98.3°F | Resp 18 | Ht 63.0 in | Wt 194.8 lb

## 2019-05-09 DIAGNOSIS — R12 Heartburn: Secondary | ICD-10-CM | POA: Diagnosis not present

## 2019-05-09 DIAGNOSIS — R682 Dry mouth, unspecified: Secondary | ICD-10-CM | POA: Insufficient documentation

## 2019-05-09 DIAGNOSIS — C50411 Malignant neoplasm of upper-outer quadrant of right female breast: Secondary | ICD-10-CM | POA: Diagnosis not present

## 2019-05-09 DIAGNOSIS — Z791 Long term (current) use of non-steroidal anti-inflammatories (NSAID): Secondary | ICD-10-CM | POA: Insufficient documentation

## 2019-05-09 DIAGNOSIS — Z79811 Long term (current) use of aromatase inhibitors: Secondary | ICD-10-CM | POA: Diagnosis not present

## 2019-05-09 DIAGNOSIS — N951 Menopausal and female climacteric states: Secondary | ICD-10-CM | POA: Insufficient documentation

## 2019-05-09 DIAGNOSIS — K5900 Constipation, unspecified: Secondary | ICD-10-CM | POA: Diagnosis not present

## 2019-05-09 DIAGNOSIS — Z170 Estrogen receptor positive status [ER+]: Secondary | ICD-10-CM | POA: Diagnosis not present

## 2019-05-10 ENCOUNTER — Telehealth: Payer: Self-pay | Admitting: Hematology

## 2019-05-10 NOTE — Telephone Encounter (Signed)
No los per 10/7. °

## 2019-05-11 ENCOUNTER — Other Ambulatory Visit: Payer: Self-pay

## 2019-05-11 ENCOUNTER — Ambulatory Visit (AMBULATORY_SURGERY_CENTER): Payer: Self-pay | Admitting: *Deleted

## 2019-05-11 VITALS — Temp 97.3°F | Ht 63.0 in | Wt 196.0 lb

## 2019-05-11 DIAGNOSIS — Z8371 Family history of colonic polyps: Secondary | ICD-10-CM

## 2019-05-11 MED ORDER — SUPREP BOWEL PREP KIT 17.5-3.13-1.6 GM/177ML PO SOLN: 1.0000 | Freq: Once | ORAL | 0 refills | Status: AC

## 2019-05-11 NOTE — Progress Notes (Signed)
No egg or soy allergy known to patient  No issues with past sedation with any surgeries  or procedures, no intubation problems  No diet pills per patient No home 02 use per patient  No blood thinners per patient  Pt states  issues with constipation  - she states since she started her cancer drug she has constipation- onc told her to use Miralax daily but she has not started that yet - will do a 2 day Suprep / Miralax prep  No A fib or A flutter  EMMI video sent to pt's e mail   Due to the COVID-19 pandemic we are asking patients to follow these guidelines. Please only bring one care partner. Please be aware that your care partner may wait in the car in the parking lot or if they feel like they will be too hot to wait in the car, they may wait in the lobby on the 4th floor. All care partners are required to wear a mask the entire time (we do not have any that we can provide them), they need to practice social distancing, and we will do a Covid check for all patient's and care partners when you arrive. Also we will check their temperature and your temperature. If the care partner waits in their car they need to stay in the parking lot the entire time and we will call them on their cell phone when the patient is ready for discharge so they can bring the car to the front of the building. Also all patient's will need to wear a mask into building.  Suprep $15 coupon to pt

## 2019-05-14 ENCOUNTER — Encounter: Payer: Self-pay | Admitting: Gastroenterology

## 2019-05-15 ENCOUNTER — Other Ambulatory Visit: Payer: Self-pay

## 2019-05-15 ENCOUNTER — Encounter (INDEPENDENT_AMBULATORY_CARE_PROVIDER_SITE_OTHER): Payer: Self-pay | Admitting: Family Medicine

## 2019-05-15 ENCOUNTER — Ambulatory Visit (INDEPENDENT_AMBULATORY_CARE_PROVIDER_SITE_OTHER): Payer: 59 | Admitting: Family Medicine

## 2019-05-15 ENCOUNTER — Encounter: Payer: Self-pay | Admitting: Family Medicine

## 2019-05-15 VITALS — BP 125/85 | HR 74 | Temp 98.0°F | Ht 63.0 in | Wt 191.0 lb

## 2019-05-15 DIAGNOSIS — R0602 Shortness of breath: Secondary | ICD-10-CM

## 2019-05-15 DIAGNOSIS — Z6833 Body mass index (BMI) 33.0-33.9, adult: Secondary | ICD-10-CM

## 2019-05-15 DIAGNOSIS — Z1331 Encounter for screening for depression: Secondary | ICD-10-CM | POA: Diagnosis not present

## 2019-05-15 DIAGNOSIS — Z9189 Other specified personal risk factors, not elsewhere classified: Secondary | ICD-10-CM

## 2019-05-15 DIAGNOSIS — E7849 Other hyperlipidemia: Secondary | ICD-10-CM

## 2019-05-15 DIAGNOSIS — R5383 Other fatigue: Secondary | ICD-10-CM | POA: Diagnosis not present

## 2019-05-15 DIAGNOSIS — Z0289 Encounter for other administrative examinations: Secondary | ICD-10-CM

## 2019-05-15 DIAGNOSIS — E669 Obesity, unspecified: Secondary | ICD-10-CM

## 2019-05-16 LAB — CBC WITH DIFFERENTIAL/PLATELET
Basophils Absolute: 0.1 10*3/uL (ref 0.0–0.2)
Basos: 1 %
EOS (ABSOLUTE): 0.1 10*3/uL (ref 0.0–0.4)
Eos: 2 %
Hematocrit: 41 % (ref 34.0–46.6)
Hemoglobin: 13.3 g/dL (ref 11.1–15.9)
Immature Grans (Abs): 0 10*3/uL (ref 0.0–0.1)
Immature Granulocytes: 1 %
Lymphocytes Absolute: 1.2 10*3/uL (ref 0.7–3.1)
Lymphs: 22 %
MCH: 30.2 pg (ref 26.6–33.0)
MCHC: 32.4 g/dL (ref 31.5–35.7)
MCV: 93 fL (ref 79–97)
Monocytes Absolute: 0.4 10*3/uL (ref 0.1–0.9)
Monocytes: 7 %
Neutrophils Absolute: 3.7 10*3/uL (ref 1.4–7.0)
Neutrophils: 67 %
Platelets: 257 10*3/uL (ref 150–450)
RBC: 4.4 x10E6/uL (ref 3.77–5.28)
RDW: 12.7 % (ref 11.7–15.4)
WBC: 5.4 10*3/uL (ref 3.4–10.8)

## 2019-05-16 LAB — COMPREHENSIVE METABOLIC PANEL
ALT: 11 IU/L (ref 0–32)
AST: 15 IU/L (ref 0–40)
Albumin/Globulin Ratio: 1.5 (ref 1.2–2.2)
Albumin: 4.5 g/dL (ref 3.8–4.9)
Alkaline Phosphatase: 102 IU/L (ref 39–117)
BUN/Creatinine Ratio: 19 (ref 9–23)
BUN: 12 mg/dL (ref 6–24)
Bilirubin Total: 0.4 mg/dL (ref 0.0–1.2)
CO2: 23 mmol/L (ref 20–29)
Calcium: 9.8 mg/dL (ref 8.7–10.2)
Chloride: 100 mmol/L (ref 96–106)
Creatinine, Ser: 0.64 mg/dL (ref 0.57–1.00)
GFR calc Af Amer: 114 mL/min/{1.73_m2} (ref >59)
GFR calc non Af Amer: 99 mL/min/{1.73_m2} (ref >59)
Globulin, Total: 3 g/dL (ref 1.5–4.5)
Glucose: 98 mg/dL (ref 65–99)
Potassium: 4.5 mmol/L (ref 3.5–5.2)
Sodium: 139 mmol/L (ref 134–144)
Total Protein: 7.5 g/dL (ref 6.0–8.5)

## 2019-05-16 LAB — HEMOGLOBIN A1C
Est. average glucose Bld gHb Est-mCnc: 108 mg/dL
Hgb A1c MFr Bld: 5.4 % (ref 4.8–5.6)

## 2019-05-16 LAB — VITAMIN B12: Vitamin B-12: 395 pg/mL (ref 232–1245)

## 2019-05-16 LAB — LIPID PANEL WITH LDL/HDL RATIO
Cholesterol, Total: 226 mg/dL — ABNORMAL HIGH (ref 100–199)
HDL: 70 mg/dL (ref >39)
LDL Chol Calc (NIH): 113 mg/dL — ABNORMAL HIGH (ref 0–99)
LDL/HDL Ratio: 1.6 ratio (ref 0.0–3.2)
Triglycerides: 251 mg/dL — ABNORMAL HIGH (ref 0–149)
VLDL Cholesterol Cal: 43 mg/dL — ABNORMAL HIGH (ref 5–40)

## 2019-05-16 LAB — T4, FREE: Free T4: 1.15 ng/dL (ref 0.82–1.77)

## 2019-05-16 LAB — T3: T3, Total: 140 ng/dL (ref 71–180)

## 2019-05-16 LAB — VITAMIN D 25 HYDROXY (VIT D DEFICIENCY, FRACTURES): Vit D, 25-Hydroxy: 19.4 ng/mL — ABNORMAL LOW (ref 30.0–100.0)

## 2019-05-16 LAB — INSULIN, RANDOM: INSULIN: 14.9 u[IU]/mL (ref 2.6–24.9)

## 2019-05-16 LAB — FOLATE: Folate: 10.7 ng/mL (ref >3.0)

## 2019-05-16 LAB — TSH: TSH: 1.56 u[IU]/mL (ref 0.450–4.500)

## 2019-05-17 NOTE — Progress Notes (Signed)
Office: [phone]  /  Fax: [phone]   HPI:   Chief Complaint: Heather Hayes (MR# [id number]) is a 58 y.o. female who presents on 05/15/2019 for obesity evaluation and treatment. Current BMI is Body mass index is 33.83 kg/m. Heather Hayes has struggled with obesity for years and has been unsuccessful in either losing weight or maintaining long term weight loss. Heather Hayes attended our information session and states she is currently in the action stage of change and ready to dedicate time achieving and maintaining a healthier weight.   Heather Hayes started gaining weight after starting Arimidex for breast cancer. She was referred to [name] by her Oncologist.  Heather Hayes states her family eats meals together she thinks her family will eat healthier with  her she struggles with family and or coworkers weight loss sabotage her desired weight loss is 36 lbs she has been heavy most of  her life she started gaining weight during menopause her heaviest weight ever was 196 lbs she has significant food cravings issues  she is frequently drinking liquids with calories she frequently makes poor food choices she frequently eats larger portions than normal  she struggles with emotional eating    Fatigue Adrean feels her energy is lower than it should be. This has worsened with weight gain and has not worsened recently. Yejin admits to daytime somnolence and  admits to waking up still tired. Patient is at risk for obstructive sleep apnea. Patent has a history of symptoms of daytime fatigue. Patient generally gets 6 hours of sleep per night, and states they generally have nightime awakenings. Snoring is present. Apneic episodes are not present. Epworth Sleepiness Score is 4.  Dyspnea on exertion Corea notes increasing shortness of breath with exercising and seems to be worsening over time with weight gain. She notes getting out of breath sooner with activity than she used to. This has not gotten worse recently. Heather Hayes  denies orthopnea.  Hyperlipidemia Arshi has a diagnosis of hyperlipidemia. She is not on statin and would like to try to improve her cholesterol levels with intensive lifestyle modification including a low saturated fat diet, exercise and weight loss. She denies any chest pain, claudication or myalgias.  At risk for cardiovascular disease Heather Hayes is at a higher than average risk for cardiovascular disease due to obesity and hyperlipidemia. She currently denies any chest pain.  Depression Screen Heather Hayes's Food and Mood (modified PHQ-9) score was  Depression screen PHQ 2/9 05/15/2019  Decreased Interest 1  Down, Depressed, Hopeless 1  PHQ - 2 Score 2  Altered sleeping 3  Tired, decreased energy 2  Change in appetite 2  Feeling bad or failure about yourself  2  Trouble concentrating 1  Moving slowly or fidgety/restless 1  Suicidal thoughts 0  PHQ-9 Score 13  Difficult doing work/chores Somewhat difficult    ASSESSMENT AND PLAN:  Other fatigue - Plan: EKG 12-Lead, Comprehensive metabolic panel, CBC with Differential/Platelet, Hemoglobin A1c, Insulin, random, VITAMIN D 25 Hydroxy (Vit-D Deficiency, Fractures), Vitamin B12, Folate, T3, T4, free, TSH  SOB (shortness of breath) on exertion - Plan: Lipid Panel With LDL/HDL Ratio  Other hyperlipidemia - Plan: Lipid Panel With LDL/HDL Ratio  Depression screening  At risk for heart disease  Class 1 obesity with serious comorbidity and body mass index (BMI) of 33.0 to 33.9 in adult, unspecified obesity type  PLAN:  Fatigue Heather Hayes was informed that her fatigue may be related to obesity, depression or many other causes. Labs will be ordered, and  in the meanwhile Heather Hayes has agreed to work on diet, exercise and weight loss to help with fatigue. Proper sleep hygiene was discussed including the need for 7-8 hours of quality sleep each night. A sleep study was not ordered based on symptoms and Epworth score.  Dyspnea on exertion Heather Hayes's shortness of  breath appears to be obesity related and exercise induced. She has agreed to work on weight loss and gradually increase exercise to treat her exercise induced shortness of breath. If Starlee follows our instructions and loses weight without improvement of her shortness of breath, we will plan to refer to pulmonology. We will monitor this condition regularly. Heather Hayes agrees to this plan.  Hyperlipidemia Heather Hayes was informed of the American Heart Association Guidelines emphasizing intensive lifestyle modifications as the first line treatment for hyperlipidemia. We discussed many lifestyle modifications today in depth, and Heather Hayes will continue to work on decreasing saturated fats such as fatty red meat, butter and many fried foods. She will also increase vegetables and lean protein in her diet and continue to work on diet, exercise, and weight loss efforts. We will check labs today.  Cardiovascular risk counseling Heather Hayes was given extended (15 minutes) coronary artery disease prevention counseling today. She is 58 y.o. female and has risk factors for heart disease including obesity and hyperlipidemia. We discussed intensive lifestyle modifications today with an emphasis on specific weight loss instructions and strategies. Pt was also informed of the importance of increasing exercise and decreasing saturated fats to help prevent heart disease.  Depression Screen Heather Hayes had a moderately positive depression screening. Depression is commonly associated with obesity and often results in emotional eating behaviors. We will monitor this closely and work on CBT to help improve the non-hunger eating patterns. Referral to Psychology may be required if no improvement is seen as she continues in our clinic.  Obesity Heather Hayes is currently in the action stage of change and her goal is to continue with weight loss efforts She has agreed to follow the Category 2 plan Heather Hayes has been instructed to work up to a goal of 150 minutes of combined  cardio and strengthening exercise per week for weight loss and overall health benefits. We discussed the following Behavioral Modification Strategies today: increasing lean protein intake and decreasing simple carbohydrates   Heather Hayes has agreed to follow up with our clinic in 2 weeks. She was informed of the importance of frequent follow up visits to maximize her success with intensive lifestyle modifications for her multiple health conditions. She was informed we would discuss her lab results at her next visit unless there is a critical issue that needs to be addressed sooner. Heather Hayes agreed to keep her next visit at the agreed upon time to discuss these results.  ALLERGIES: Allergies  Allergen Reactions  . Compazine [Prochlorperazine Edisylate]     Muscle contractures     MEDICATIONS: Current Outpatient Medications on File Prior to Visit  Medication Sig Dispense Refill  . anastrozole (ARIMIDEX) 1 MG tablet Take 1 tablet (1 mg total) by mouth daily. 30 tablet 5  . clonazePAM (KLONOPIN) 0.5 MG tablet Take 0.5 mg by mouth 2 (two) times daily as needed for anxiety.    . meloxicam (MOBIC) 15 MG tablet Take 15 mg by mouth daily.     No current facility-administered medications on file prior to visit.     PAST MEDICAL HISTORY: Past Medical History:  Diagnosis Date  . Anxiety   . Arthritis   . Back pain   .  Bursitis   . Cancer [facility])    right breast cancer 09-2018  . Constipation   . GERD (gastroesophageal reflux disease)    some heart burn from Arimidex  . History of radiation therapy    completed 01-29-2019  . Hyperlipidemia   . Joint pain   . Rheumatic fever   . Swallowing difficulty     PAST SURGICAL HISTORY: Past Surgical History:  Procedure Laterality Date  . BREAST LUMPECTOMY WITH RADIOACTIVE SEED AND SENTINEL LYMPH NODE BIOPSY Right 10/27/2018   Procedure: RIGHT BREAST LUMPECTOMY WITH RADIOACTIVE SEED AND SENTINEL LYMPH NODE BIOPSY;  Surgeon: Excell Seltzer, MD;  Location:  [facility];  Service: General;  Laterality: Right;  . CERVICAL SPINE SURGERY  2009  . COLONOSCOPY    . ENDOMETRIAL ABLATION    . POLYPECTOMY    . TUBAL LIGATION    . tube ligation       SOCIAL HISTORY: Social History   Tobacco Use  . Smoking status: Never Smoker  . Smokeless tobacco: Never Used  Substance Use Topics  . Alcohol use: Yes    Alcohol/week: 1.0 - 2.0 standard drinks    Types: 1 - 2 Glasses of wine per week  . Drug use: Never    FAMILY HISTORY: Family History  Problem Relation Age of Onset  . Breast cancer Mother 16  . Diabetes Mother   . Hypertension Mother   . Hyperlipidemia Mother   . Obesity Mother   . Diabetes Father   . Hypertension Father   . Hyperlipidemia Father   . Ovarian cancer Maternal Grandmother   . Colon cancer Neg Hx   . Esophageal cancer Neg Hx   . Rectal cancer Neg Hx   . Stomach cancer Neg Hx     ROS: Review of Systems  Constitutional: Positive for malaise/fatigue. Negative for weight loss.       + Trouble sleeping  HENT:       + Nasal stuffiness  Eyes:       + Wear glasses or contacts  Respiratory: Positive for shortness of breath (with exertion).   Cardiovascular: Negative for chest pain, orthopnea and claudication.  Gastrointestinal: Positive for constipation and heartburn.       + Swallowing difficulty  Musculoskeletal: Positive for back pain. Negative for myalgias.       + Muscle or joint pain  Skin:       + Dryness  Psychiatric/Behavioral:       + Stress    PHYSICAL EXAM: Blood pressure 125/85, pulse 74, temperature 98 F (36.7 C), temperature source Oral, height 5\' 3"  (1.6 m), weight 191 lb (86.6 kg), SpO2 99 %. Body mass index is 33.83 kg/m. Physical Exam Vitals signs reviewed.  Constitutional:      Appearance: Normal appearance. She is obese.  HENT:     Head: Normocephalic and atraumatic.     Nose: Nose normal.  Eyes:     General: No scleral icterus.    Extraocular Movements: Extraocular  movements intact.  Neck:     Musculoskeletal: Normal range of motion and neck supple.     Comments: No thyromegaly present Cardiovascular:     Rate and Rhythm: Normal rate and regular rhythm.     Pulses: Normal pulses.     Heart sounds: Normal heart sounds.  Pulmonary:     Effort: Pulmonary effort is normal. No respiratory distress.     Breath sounds: Normal breath sounds.  Abdominal:     Palpations: Abdomen  is soft.     Tenderness: There is no abdominal tenderness.     Comments: + Obesity  Musculoskeletal: Normal range of motion.     Right lower leg: No edema.     Left lower leg: No edema.  Skin:    General: Skin is warm and dry.  Neurological:     Mental Status: She is alert and oriented to person, place, and time.     Coordination: Coordination normal.  Psychiatric:        Mood and Affect: Mood normal.        Behavior: Behavior normal.     RECENT LABS AND TESTS: BMET    Component Value Date/Time   NA 139 05/15/2019 0850   K 4.5 05/15/2019 0850   CL 100 05/15/2019 0850   CO2 23 05/15/2019 0850   GLUCOSE 98 05/15/2019 0850   GLUCOSE 88 02/26/2019 1036   BUN 12 05/15/2019 0850   CREATININE 0.64 05/15/2019 0850   CREATININE 0.75 02/26/2019 1036   CALCIUM 9.8 05/15/2019 0850   GFRNONAA 99 05/15/2019 0850   GFRNONAA >60 02/26/2019 1036   GFRAA 114 05/15/2019 0850   GFRAA >60 02/26/2019 1036   Lab Results  Component Value Date   HGBA1C 5.4 05/15/2019   Lab Results  Component Value Date   INSULIN 14.9 05/15/2019   CBC    Component Value Date/Time   WBC 5.4 05/15/2019 0850   WBC 4.9 02/26/2019 1036   WBC 6.3 09/06/2007 1520   RBC 4.40 05/15/2019 0850   RBC 4.33 02/26/2019 1036   HGB 13.3 05/15/2019 0850   HCT 41.0 05/15/2019 0850   PLT 257 05/15/2019 0850   MCV 93 05/15/2019 0850   MCH 30.2 05/15/2019 0850   MCH 30.5 02/26/2019 1036   MCHC 32.4 05/15/2019 0850   MCHC 32.4 02/26/2019 1036   RDW 12.7 05/15/2019 0850   LYMPHSABS 1.2 05/15/2019 0850    MONOABS 0.4 02/26/2019 1036   EOSABS 0.1 05/15/2019 0850   BASOSABS 0.1 05/15/2019 0850   Iron/TIBC/Ferritin/ %Sat No results found for: IRON, TIBC, FERRITIN, IRONPCTSAT Lipid Panel     Component Value Date/Time   CHOL 226 (H) 05/15/2019 0850   TRIG 251 (H) 05/15/2019 0850   HDL 70 05/15/2019 0850   LDLCALC 113 (H) 05/15/2019 0850   Hepatic Function Panel     Component Value Date/Time   PROT 7.5 05/15/2019 0850   ALBUMIN 4.5 05/15/2019 0850   AST 15 05/15/2019 0850   AST 16 02/26/2019 1036   ALT 11 05/15/2019 0850   ALT 14 02/26/2019 1036   ALKPHOS 102 05/15/2019 0850   BILITOT 0.4 05/15/2019 0850   BILITOT 0.3 02/26/2019 1036      Component Value Date/Time   TSH 1.560 05/15/2019 0850   Vitamin D No recent labs  ECG  shows NSR with a rate of 67 BPM INDIRECT CALORIMETER done today shows a VO2 of 179 and a REE of 1245. Her calculated basal metabolic rate is [id number] thus her basal metabolic rate is worse than expected.       OBESITY BEHAVIORAL INTERVENTION VISIT  Today's visit was # 1   Starting weight: 191 lbs Starting date: 05/15/2019 Today's weight : 191 lbs Today's date: 05/15/2019 Total lbs lost to date: 0    ASK: We discussed the diagnosis of obesity with Sinda Du today and Talianna agreed to give [name] permission to discuss obesity behavioral modification therapy today.  ASSESS: Elisavet has the diagnosis of obesity and her  BMI today is 33.84 Safiyah is in the action stage of change   ADVISE: Paizli was educated on the multiple health risks of obesity as well as the benefit of weight loss to improve her health. She was advised of the need for long term treatment and the importance of lifestyle modifications to improve her current health and to decrease her risk of future health problems.  AGREE: Multiple dietary modification options and treatment options were discussed and  Kaelene agreed to follow the recommendations documented in the above note.  ARRANGE:  Josett was educated on the importance of frequent visits to treat obesity as outlined per CMS and USPSTF guidelines and agreed to schedule her next follow up appointment today.   I, Trixie Dredge, am acting as transcriptionist for Dennard Nip, MD   I have reviewed the above documentation for accuracy and completeness, and I agree with the above. -Dennard Nip, MD

## 2019-05-24 ENCOUNTER — Telehealth: Payer: Self-pay

## 2019-05-24 NOTE — Progress Notes (Signed)
Office: [phone]  /  Fax: [phone]    Date: May 29, 2019   Appointment Start Time: 3:00pm Duration: 41 minutes Provider: Glennie Hayes, Psy.D. Type of Session: Intake for Individual Therapy  Location of Patient: Work Location of Provider: [facility] Type of Contact: Telepsychological Visit via News Hayes  Informed Consent: Prior to proceeding with today's appointment, two pieces of identifying information were obtained from Heather Hayes to verify identity. In addition, Heather Hayes physical location at the time of this appointment was obtained. In the event of technical difficulties, Heather Hayes shared a phone number she could be reached at. Heather Hayes and this provider participated in today's telepsychological service. Also, Heather Hayes denied anyone else being present in the room or on the WebEx appointment.   The provider's role was explained to Heather Hayes. The provider reviewed and discussed issues of confidentiality, privacy, and limits therein (e.g., reporting obligations). In addition to verbal informed consent, written informed consent for psychological services was obtained from Heather Hayes prior to the initial intake interview. Written consent included information concerning the practice, financial arrangements, and confidentiality and patients' rights. Since the clinic is not a 24/7 crisis center, mental health emergency resources were shared, and the provider explained MyChart, e-mail, voicemail, and/or other messaging systems should be utilized only for non-emergency reasons. This provider also explained that information obtained during appointments will be placed in Heather Hayes's medical record in a confidential manner and relevant information will be shared with other providers at [facility] that she meets with for coordination of care. Heather Hayes verbally acknowledged understanding of the aforementioned, and agreed to use mental health emergency resources discussed if needed.  Moreover, Heather Hayes agreed information may be shared with other [facility] providers as needed for coordination of care. By signing the service agreement document, Heather Hayes provided written consent for coordination of care.   Prior to initiating telepsychological services, Heather Hayes was provided with an informed consent document, which included the development of a safety plan (i.e., an emergency contact and emergency resources) in the event of an emergency/crisis. Heather Hayes expressed understanding of the rationale of the safety plan and provided consent for this provider to reach out to her emergency contact in the event of an emergency/crisis. Heather Hayes returned the completed consent form prior to today's appointment. This provider verbally reviewed the consent form during today's appointment prior to proceeding with the appointment. Heather Hayes verbally acknowledged understanding that she is ultimately responsible for understanding her insurance benefits as it relates to reimbursement of telepsychological and in-person services. This provider also reviewed confidentiality, as it relates to telepsychological services, as well as the rationale for telepsychological services. More specifically, this provider's clinic is providing telepsychological services as an option for appointments to reduce exposure to COVID-19. [name] expressed understanding regarding the rationale for telepsychological services. In addition, this provider explained the telepsychological services informed consent document would be considered an addendum to the initial consent document/service agreement. Heather Hayes verbally consented to proceed.   Chief Complaint/HPI: Heather Hayes was referred by Dr. Dennard Hayes. During the initial appointment with Dr. Dennard Hayes at [facility] on May 15, 2019, Heather Hayes reported experiencing the following: significant food cravings issues , frequently drinking liquids with calories, frequently making poor food choices,  frequently eating larger portions than normal  and struggling with emotional eating. Heather Hayes's Food and Mood (modified PHQ-9) score was 13.  During today's appointment, Heather Hayes was verbally administered a questionnaire assessing various behaviors related to emotional eating. Heather Hayes endorsed the following: overeat  when you are celebrating, experience food cravings on a regular basis, eat certain foods when you are anxious, stressed, depressed, or your feelings are hurt, use food to help you cope with emotional situations, find food is comforting to you, overeat when you are worried about something, overeat frequently when you are bored or lonely, not worry about what you eat when you are in a good mood and eat as a reward. She shared she craves baked goods, carbohydrates (e.g., bread), and salty foods. Heather Hayes believes the onset of emotional eating was likely 20 years ago when she had to "parent [her] parent." She described the current frequency of emotional eating as "daily or at least 3 to 4 days a week." In addition, Heather Hayes denied a history of binge eating. Heather Hayes denied a history of restricting food intake, purging and engagement in other compensatory strategies, and has never been diagnosed with an eating disorder. She also denied a history of treatment for emotional eating. Moreover, Heather Hayes indicated anxiety or worry triggers emotional eating, whereas spending time with her dogs and spending time with her granddaughter makes emotional eating better. Furthermore, Heather Hayes denied other problems of concern.    Mental Status Examination:  Appearance: neat Behavior: cooperative Mood: euthymic Affect: mood congruent Speech: normal in rate, volume, and tone Eye Contact: appropriate Psychomotor Activity: appropriate Thought Process: linear, logical, and goal directed  Content/Perceptual Disturbances: denies suicidal and homicidal ideation, plan, and intent and no hallucinations, delusions, bizarre thinking or behavior reported  or observed Orientation: time, person, place and purpose of appointment Cognition/Sensorium: memory, attention, language, and fund of knowledge intact  Insight: good Judgment: good  Family & Psychosocial History: Heather Hayes reported she is married (20 years) and has three adult children. 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 Additionally, Heather Hayes shared her highest level of education obtained is a high school diploma. Currently, Heather Hayes's social support system consists of her husband, children, older sister, several really close friends at work, and church family. Moreover, Heather Hayes stated she resides with her husband and daughter (age 48).   Medical History:  Past Medical History:  Diagnosis Date   Anxiety    Arthritis    Back pain    Bursitis    Cancer ([place])    right breast cancer 09-2018   Constipation    GERD (gastroesophageal reflux disease)    some heart burn from Arimidex   History of radiation therapy    completed 01-29-2019   Hyperlipidemia    Joint pain    Rheumatic fever    Swallowing difficulty    Past Surgical History:  Procedure Laterality Date   BREAST LUMPECTOMY WITH RADIOACTIVE SEED AND SENTINEL LYMPH NODE BIOPSY Right 10/27/2018   Procedure: RIGHT BREAST LUMPECTOMY WITH RADIOACTIVE SEED AND SENTINEL LYMPH NODE BIOPSY;  Surgeon: Excell Seltzer, MD;  Location: [facility];  Service: General;  Laterality: Right;   CERVICAL SPINE SURGERY  2009   COLONOSCOPY     ENDOMETRIAL ABLATION     POLYPECTOMY     TUBAL LIGATION     tube ligation      Current Outpatient Medications on File Prior to Visit  Medication Sig Dispense Refill   anastrozole (ARIMIDEX) 1 MG tablet Take 1 tablet (1 mg total) by mouth daily. 30 tablet 5   clonazePAM (KLONOPIN) 0.5 MG tablet Take 0.5 mg by mouth 2 (two) times daily as needed for anxiety.     meloxicam (MOBIC) 15 MG tablet Take 15 mg  by mouth daily.      Vitamin D, Ergocalciferol, (DRISDOL) 1.25 MG (50000 UT) CAPS capsule Take 1 capsule (50,000 Units total) by mouth every 7 (seven) days. 4 capsule 0   No current facility-administered medications on file prior to visit.   Heather Hayes denied a history of head injuries and loss of consciousness.    Mental Health History: Heather Hayes denied a history of therapeutic services. Heather Hayes denied a history of hospitalizations for psychiatric concerns, and has never met with a psychiatrist. Heather Hayes reported her PCP prescribes Klonopin PRN to help with "the elephant on the chest." Heather Hayes denied a family history of mental health related concerns. Heather Hayes denied a trauma history, including psychological, physical  and sexual abuse, as well as neglect.   Heather Hayes described her typical mood as "positive." Aside from concerns noted above and endorsed on the GAD-7, Reham reported experiencing decreased motivation and worry thoughts about her son's well-being. She further shared, "I'm a little obsessive, compulsive." She explained she likes things a certain way and things have to be in their place before she leaves home. Heather Hayes noted that if needed, she "can leave it behind." Heather Hayes endorsed current alcohol use. She noted she consumes a glass of wine "now and then." She denied tobacco use. She denied illicit/recreational substance use. Regarding caffeine intake, Landra reported consuming 2 cups of coffee daily. Furthermore, Chaunda denied experiencing the following: hopelessness, memory concerns, hallucinations and delusions, paranoia, symptoms of mania (e.g., expansive mood, flighty ideas, decreased need for sleep, engagement in risky behaviors), angry outbursts, social withdrawal, crying spells and panic attacks. She also denied history of and current suicidal ideation, plan, and intent; history of and current homicidal ideation, plan, and intent; and history of and current engagement in self-harm.  The following strengths were reported by Webb Silversmith: positive, very  thorough person, responsible, have integrity, good listener, and multi tasker. The following strengths were observed by this provider: ability to express thoughts and feelings during the therapeutic session, ability to establish and benefit from a therapeutic relationship, ability to learn and practice coping skills, willingness to work toward established goal(s) with the clinic and ability to engage in reciprocal conversation.  Legal History: Merisa denied a history of legal involvement.   Structured Assessment Results: The Patient Health Questionnaire-9 (PHQ-9) is a self-report measure that assesses symptoms and severity of depression over the course of the last two weeks. Vihana obtained a score of 0. Little interest or pleasure in doing things 0  Feeling down, depressed, or hopeless 0  Trouble falling or staying asleep, or sleeping too much 0  Feeling tired or having little energy 0  Poor appetite or overeating 0  Feeling bad about yourself --- or that you are a failure or have let yourself or your family down 0  Trouble concentrating on things, such as reading the newspaper or watching television 0  Moving or speaking so slowly that other people could have noticed? Or the opposite --- being so fidgety or restless that you have been moving around a lot more than usual 0  Thoughts that you would be better off dead or hurting yourself in some way 0  PHQ-9 Score 0    The Generalized Anxiety Disorder-7 (GAD-7) is a brief self-report measure that assesses symptoms of anxiety over the course of the last two weeks. Debbie obtained a score of 2 suggesting minimal anxiety. Evanny finds the endorsed symptoms to be not difficult at all. Feeling nervous, anxious, on edge 0  Not being able to  stop or control worrying 0  Worrying too much about different things 0  Trouble relaxing 1  Being so restless that it's hard to sit still 0  Becoming easily annoyed or irritable 1  Feeling afraid as if something awful  might happen 0  GAD-7 Score 2   Interventions: A chart review was conducted prior to the clinical intake interview. The PHQ-9, and GAD-7 were verbally administered as well as a Mood and Food questionnaire to assess various behaviors related to emotional eating. Throughout session, empathic reflections and validation was provided. Continuing treatment with this provider was discussed and a treatment goal was established. Psychoeducation regarding emotional versus physical hunger was provided. Mayerly was sent a handout via e-mail to utilize between now and the next appointment to increase awareness of hunger patterns and subsequent eating. Deondria provided verbal consent during today's appointment for this provider to send the handout via e-mail.   Provisional DSM-5 Diagnosis: 300.09 (F41.8) Other Specified Anxiety Disorder, Emotional Eating Behaviors  Plan: Caria appears able and willing to participate as evidenced by collaboration on a treatment goal, engagement in reciprocal conversation, and asking questions as needed for clarification. The next appointment will be scheduled in three weeks, which will be via News Hayes. The following treatment goal was established: decrease emotional eating. For the aforementioned goal, Abigale can benefit from individual therapy sessions that are brief in duration for approximately four to six sessions. The treatment modality will be individual therapeutic services, including an eclectic therapeutic approach utilizing techniques from Cognitive Behavioral Therapy, Patient Centered Therapy, Dialectical Behavior Therapy, Acceptance and Commitment Therapy, Interpersonal Therapy, and Cognitive Restructuring. Therapeutic approach will include various interventions as appropriate, such as validation, support, mindfulness, thought defusion, reframing, psychoeducation, values assessment, and role playing. This provider will regularly review the treatment plan and medical chart to keep  informed of status changes. Aairah expressed understanding and agreement with the initial treatment plan of care.

## 2019-05-24 NOTE — Telephone Encounter (Signed)
Covid-19 screening questions   Do you now or have you had a fever in the last 14 days?  Do you have any respiratory symptoms of shortness of breath or cough now or in the last 14 days?  Do you have any family members or close contacts with diagnosed or suspected Covid-19 in the past 14 days?  Have you been tested for Covid-19 and found to be positive?       

## 2019-05-24 NOTE — Telephone Encounter (Signed)
Patient called back and answered "NO" to all screening questions. °

## 2019-05-25 ENCOUNTER — Encounter: Payer: Self-pay | Admitting: Gastroenterology

## 2019-05-25 ENCOUNTER — Other Ambulatory Visit: Payer: Self-pay

## 2019-05-25 ENCOUNTER — Ambulatory Visit (AMBULATORY_SURGERY_CENTER): Payer: 59 | Admitting: Gastroenterology

## 2019-05-25 VITALS — BP 139/77 | HR 70 | Temp 98.5°F | Resp 15 | Ht 63.0 in | Wt 196.0 lb

## 2019-05-25 DIAGNOSIS — Z8371 Family history of colonic polyps: Secondary | ICD-10-CM | POA: Diagnosis not present

## 2019-05-25 DIAGNOSIS — Z86010 Personal history of colonic polyps: Secondary | ICD-10-CM | POA: Diagnosis not present

## 2019-05-25 DIAGNOSIS — D125 Benign neoplasm of sigmoid colon: Secondary | ICD-10-CM

## 2019-05-25 DIAGNOSIS — D124 Benign neoplasm of descending colon: Secondary | ICD-10-CM | POA: Diagnosis not present

## 2019-05-25 MED ORDER — SODIUM CHLORIDE 0.9 % IV SOLN: 500.0000 mL | Freq: Once | INTRAVENOUS | Status: DC

## 2019-05-25 NOTE — Progress Notes (Signed)
Called to room to assist during endoscopic procedure.  Patient ID and intended procedure confirmed with present staff. Received instructions for my participation in the procedure from the performing physician.  

## 2019-05-25 NOTE — Op Note (Addendum)
[facility] Patient Name: Heather Hayes Procedure Date: 05/25/2019 8:32 AM MRN: [id number] Endoscopist: Jackquline Denmark , MD Age: 58 Referring MD:  Date of Birth: [birth year] Gender: Female Account #: [id number] Procedure:                Colonoscopy Indications:              High risk colon cancer surveillance: FH of colonic                            polyps. Personal history of colonic polyps Medicines:                Monitored Anesthesia Care Procedure:                Pre-Anesthesia Assessment:                           - Prior to the procedure, a History and Physical                            was performed, and patient medications and                            allergies were reviewed. The patient's tolerance of                            previous anesthesia was also reviewed. The risks                            and benefits of the procedure and the sedation                            options and risks were discussed with the patient.                            All questions were answered, and informed consent                            was obtained. Prior Anticoagulants: The patient has                            taken no previous anticoagulant or antiplatelet                            agents. ASA Grade Assessment: II - A patient with                            mild systemic disease. After reviewing the risks                            and benefits, the patient was deemed in                            satisfactory condition to undergo the procedure.  After obtaining informed consent, the colonoscope                            was passed under direct vision. Throughout the                            procedure, the patient's blood pressure, pulse, and                            oxygen saturations were monitored continuously. The                            Colonoscope was introduced through the anus and                            advanced to the 2 cm  into the ileum. The                            colonoscopy was performed without difficulty. The                            patient tolerated the procedure well. The quality                            of the bowel preparation was good. The terminal                            ileum, ileocecal valve, appendiceal orifice, and                            rectum were photographed. Scope In: 8:37:38 AM Scope Out: 8:54:17 AM Scope Withdrawal Time: 0 hours 11 minutes 2 seconds  Total Procedure Duration: 0 hours 16 minutes 39 seconds  Findings:                 Two sessile polyps were found in the sigmoid colon                            and descending colon. The polyps were 4 to 6 mm in                            size. These polyps were removed with a cold snare.                            Resection and retrieval were complete.                           A few small-mouthed diverticula were found in the                            sigmoid colon.                           External and internal hemorrhoids were found during  retroflexion and during perianal exam. The                            hemorrhoids were small.                           The exam was otherwise without abnormality on                            direct and retroflexion views. Complications:            No immediate complications. Estimated Blood Loss:     Estimated blood loss: none. Impression:               -Colonic polyps s/p polypectomy.                           -Mild sigmoid diverticulosis                           -Small internal and external hemorrhoids.                           -Otherwise normal colonoscopy to TI. Recommendation:           - Patient has a contact number available for                            emergencies. The signs and symptoms of potential                            delayed complications were discussed with the                            patient. Return to normal activities  tomorrow.                            Written discharge instructions were provided to the                            patient.                           - Resume previous diet.                           - Continue present medications.                           - Await pathology results.                           - Repeat colonoscopy for surveillance based on                            pathology results.                           - Miralax 17 g  p.o. once a day.                           - Return to GI clinic PRN.                           - The findings and recommendations were discussed                            with [name]. Jackquline Denmark, MD 05/25/2019 9:02:06 AM This report has been signed electronically.

## 2019-05-25 NOTE — Progress Notes (Signed)
Report given to PACU, vss 

## 2019-05-25 NOTE — Patient Instructions (Signed)
Read all handouts given to you by your recovery room nurse.  Thank-you for choosing us for your healthcare needs today.  YOU HAD AN ENDOSCOPIC PROCEDURE TODAY AT THE [facility]:   Refer to the procedure report that was given to you for any specific questions about what was found during the examination.  If the procedure report does not answer your questions, please call your gastroenterologist to clarify.  If you requested that your care partner not be given the details of your procedure findings, then the procedure report has been included in a sealed envelope for you to review at your convenience later.  YOU SHOULD EXPECT: Some feelings of bloating in the abdomen. Passage of more gas than usual.  Walking can help get rid of the air that was put into your GI tract during the procedure and reduce the bloating. If you had a lower endoscopy (such as a colonoscopy or flexible sigmoidoscopy) you may notice spotting of blood in your stool or on the toilet paper. If you underwent a bowel prep for your procedure, you may not have a normal bowel movement for a few days.  Please Note:  You might notice some irritation and congestion in your nose or some drainage.  This is from the oxygen used during your procedure.  There is no need for concern and it should clear up in a day or so.  SYMPTOMS TO REPORT IMMEDIATELY:   Following lower endoscopy (colonoscopy or flexible sigmoidoscopy):  Excessive amounts of blood in the stool  Significant tenderness or worsening of abdominal pains  Swelling of the abdomen that is new, acute  Fever of 100F or higher  For urgent or emergent issues, a gastroenterologist can be reached at any hour by calling [phone].   DIET:  We do recommend a small meal at first, but then you may proceed to your regular diet.  Drink plenty of fluids but you should avoid alcoholic beverages for 24 hours. Try to increase the fiber in your diet, and drink plenty of  water.  ACTIVITY:  You should plan to take it easy for the rest of today and you should NOT DRIVE or use heavy machinery until tomorrow (because of the sedation medicines used during the test).    FOLLOW UP: Our staff will call the number listed on your records 48-72 hours following your procedure to check on you and address any questions or concerns that you may have regarding the information given to you following your procedure. If we do not reach you, we will leave a message.  We will attempt to reach you two times.  During this call, we will ask if you have developed any symptoms of COVID 19. If you develop any symptoms (ie: fever, flu-like symptoms, shortness of breath, cough etc.) before then, please call [phone].  If you test positive for Covid 19 in the 2 weeks post procedure, please call and report this information to us.    If any biopsies were taken you will be contacted by phone or by letter within the next 1-3 weeks.  Please call us at [phone] if you have not heard about the biopsies in 3 weeks.    SIGNATURES/CONFIDENTIALITY: You and/or your care partner have signed paperwork which will be entered into your electronic medical record.  These signatures attest to the fact that that the information above on your After Visit Summary has been reviewed and is understood.  Full responsibility of the confidentiality of this discharge   information lies with you and/or your care-partner. 

## 2019-05-29 ENCOUNTER — Ambulatory Visit (INDEPENDENT_AMBULATORY_CARE_PROVIDER_SITE_OTHER): Payer: 59 | Admitting: Psychology

## 2019-05-29 ENCOUNTER — Other Ambulatory Visit: Payer: Self-pay

## 2019-05-29 ENCOUNTER — Ambulatory Visit (INDEPENDENT_AMBULATORY_CARE_PROVIDER_SITE_OTHER): Payer: 59 | Admitting: Family Medicine

## 2019-05-29 ENCOUNTER — Telehealth: Payer: Self-pay | Admitting: *Deleted

## 2019-05-29 ENCOUNTER — Encounter (INDEPENDENT_AMBULATORY_CARE_PROVIDER_SITE_OTHER): Payer: Self-pay | Admitting: Family Medicine

## 2019-05-29 VITALS — BP 119/83 | HR 73 | Temp 97.9°F | Ht 63.0 in | Wt 185.0 lb

## 2019-05-29 DIAGNOSIS — E8881 Metabolic syndrome: Secondary | ICD-10-CM | POA: Diagnosis not present

## 2019-05-29 DIAGNOSIS — F418 Other specified anxiety disorders: Secondary | ICD-10-CM

## 2019-05-29 DIAGNOSIS — E559 Vitamin D deficiency, unspecified: Secondary | ICD-10-CM | POA: Diagnosis not present

## 2019-05-29 DIAGNOSIS — E782 Mixed hyperlipidemia: Secondary | ICD-10-CM | POA: Diagnosis not present

## 2019-05-29 DIAGNOSIS — Z9189 Other specified personal risk factors, not elsewhere classified: Secondary | ICD-10-CM

## 2019-05-29 DIAGNOSIS — E669 Obesity, unspecified: Secondary | ICD-10-CM

## 2019-05-29 DIAGNOSIS — Z6832 Body mass index (BMI) 32.0-32.9, adult: Secondary | ICD-10-CM

## 2019-05-29 MED ORDER — VITAMIN D (ERGOCALCIFEROL) 1.25 MG (50000 UNIT) PO CAPS: 50000.0000 [IU] | ORAL_CAPSULE | ORAL | 0 refills | Status: DC

## 2019-05-29 NOTE — Telephone Encounter (Signed)
  Follow up Call-  Call back number 05/25/2019  Post procedure Call Back phone  # [phone] number  Permission to leave phone message Yes  Some recent data might be hidden     Patient questions: Message left to all [name] if necessary.

## 2019-05-29 NOTE — Progress Notes (Signed)
Office: [phone]  /  Fax: [phone]   HPI:   Chief Complaint: OBESITY Heather Hayes is here to discuss her progress with her obesity treatment plan. She is on the Category 2 plan and is following her eating plan approximately 95 % of the time. She states she is exercising 0 minutes 0 times per week. Heather Hayes did very well on her Category 2 eating plan. Her hunger is mostly controlled and she was able to eat all of her food. Her family was mostly supportive.  Her weight is 185 lb (83.9 kg) today and has had a weight loss of 6 pounds over a period of 2 weeks since her last visit. She has lost 6 lbs since starting treatment with [name].  Hyperlipidemia (Mixed) Heather Hayes has hyperlipidemia. Her HDL was good, while her LDL and triglycerides were elevated, which is significantly different from her last labs at [facility]. She has been trying to improve her cholesterol levels with intensive lifestyle modification including a low saturated fat diet, exercise, and weight loss.   Vitamin D Deficiency Heather Hayes has a new diagnosis of vitamin D deficiency. She is not currently on vit D. Heather Hayes notes fatigue and denies nausea, vomiting, or muscle weakness.  Insulin Resistance [name] has a new diagnosis of insulin resistance based on her elevated fasting insulin level >5. Her A1c is normal. Although Heather Hayes's blood glucose readings are still under good control, insulin resistance puts her at greater risk of metabolic syndrome and diabetes. She is not taking metformin currently and continues to work on diet and exercise to decrease risk of diabetes. Heather Hayes's polyphagia is mostly controlled with her diet prescription. She has a positive family history of diabetes in both parents. Heather Hayes denies hypoglycemia.  At risk for diabetes Heather Hayes is at higher than average risk for developing diabetes due to her insulin resistance and obesity.   ASSESSMENT AND PLAN:  Mixed hyperlipidemia  Vitamin D deficiency - Plan: Vitamin D, Ergocalciferol,  (DRISDOL) 1.25 MG (50000 UT) CAPS capsule  Insulin resistance  At risk for diabetes mellitus  Class 1 obesity with serious comorbidity and body mass index (BMI) of 32.0 to 32.9 in adult, unspecified obesity type  PLAN:  Hyperlipidemia (Mixed) Heather Hayes was informed of the American Heart Association Guidelines emphasizing intensive lifestyle modifications as the first line treatment for hyperlipidemia. We discussed many lifestyle modifications today in depth, and Heather Hayes will continue to work on decreasing saturated fats such as fatty red meat, butter, and many fried foods. She will also increase vegetables and lean protein in her diet and continue to work on exercise and weight loss efforts. Heather Hayes agrees to continue with her diet and exercise and will follow up in 2 weeks.  Vitamin D Deficiency Heather Hayes was informed that low vitamin D levels contribute to fatigue and are associated with obesity, breast, and colon cancer. Heather Hayes agrees to start to take prescription Vit D @50 ,000 IU every week #4 with no refills and will follow up for routine testing of vitamin D, at least 2-3 times per year. We will recheck her labs in 3 months. She was informed of the risk of over-replacement of vitamin D and agrees to not increase her dose unless she discusses this with [name] first. Heather Hayes agrees to follow up in 3 weeks as directed.  Insulin Resistance Heather Hayes will continue to work on weight loss, exercise, and decreasing simple carbohydrates in her diet to help decrease the risk of diabetes. She was informed that eating too many simple carbohydrates or too many calories  at one sitting increases the likelihood of GI side effects. Heather Hayes agrees to defer metformin prescription was not written today and she will continue her diet and exercise. We will check labs in 3 months. Heather Hayes agreed to follow up with [name] as directed to monitor her progress.   Diabetes risk counseling Heather Hayes was given extended (15 minutes) diabetes prevention counseling  today. She is 58 y.o. female and has risk factors for diabetes including insulin resistance and obesity. We discussed intensive lifestyle modifications today with an emphasis on weight loss as well as increasing exercise and decreasing simple carbohydrates in her diet.  Obesity Heather Hayes is currently in the action stage of change. As such, her goal is to continue with weight loss efforts. She has agreed to follow the Category 2 plan with breakfast options. Heather Hayes has been instructed to work up to a goal of 150 minutes of combined cardio and strengthening exercise per week for weight loss and overall health benefits. We discussed the following Behavioral Modification Strategies today: increasing lean protein intake and decreasing simple carbohydrates.   Heather Hayes has agreed to follow up with our clinic in 2 weeks. She was informed of the importance of frequent follow up visits to maximize her success with intensive lifestyle modifications for her multiple health conditions.  ALLERGIES: Allergies  Allergen Reactions  . Compazine [Prochlorperazine Edisylate]     Muscle contractures     MEDICATIONS: Current Outpatient Medications on File Prior to Visit  Medication Sig Dispense Refill  . anastrozole (ARIMIDEX) 1 MG tablet Take 1 tablet (1 mg total) by mouth daily. 30 tablet 5  . clonazePAM (KLONOPIN) 0.5 MG tablet Take 0.5 mg by mouth 2 (two) times daily as needed for anxiety.    . meloxicam (MOBIC) 15 MG tablet Take 15 mg by mouth daily.     No current facility-administered medications on file prior to visit.     PAST MEDICAL HISTORY: Past Medical History:  Diagnosis Date  . Anxiety   . Arthritis   . Back pain   . Bursitis   . Cancer [facility])    right breast cancer 09-2018  . Constipation   . GERD (gastroesophageal reflux disease)    some heart burn from Arimidex  . History of radiation therapy    completed 01-29-2019  . Hyperlipidemia   . Joint pain   . Rheumatic fever   . Swallowing  difficulty     PAST SURGICAL HISTORY: Past Surgical History:  Procedure Laterality Date  . BREAST LUMPECTOMY WITH RADIOACTIVE SEED AND SENTINEL LYMPH NODE BIOPSY Right 10/27/2018   Procedure: RIGHT BREAST LUMPECTOMY WITH RADIOACTIVE SEED AND SENTINEL LYMPH NODE BIOPSY;  Surgeon: Excell Seltzer, MD;  Location: [facility];  Service: General;  Laterality: Right;  . CERVICAL SPINE SURGERY  2009  . COLONOSCOPY    . ENDOMETRIAL ABLATION    . POLYPECTOMY    . TUBAL LIGATION    . tube ligation       SOCIAL HISTORY: Social History   Tobacco Use  . Smoking status: Never Smoker  . Smokeless tobacco: Never Used  Substance Use Topics  . Alcohol use: Yes    Alcohol/week: 1.0 - 2.0 standard drinks    Types: 1 - 2 Glasses of wine per week  . Drug use: Never    FAMILY HISTORY: Family History  Problem Relation Age of Onset  . Breast cancer Mother 29  . Diabetes Mother   . Hypertension Mother   . Hyperlipidemia Mother   .  Obesity Mother   . Diabetes Father   . Hypertension Father   . Hyperlipidemia Father   . Ovarian cancer Maternal Grandmother   . Colon cancer Neg Hx   . Esophageal cancer Neg Hx   . Rectal cancer Neg Hx   . Stomach cancer Neg Hx     ROS: Review of Systems  Constitutional: Positive for malaise/fatigue and weight loss.  Gastrointestinal: Negative for nausea and vomiting.  Musculoskeletal:       Negative for muscle weakness.  Endo/Heme/Allergies:       Positive for polyphagia. Negative for hypoglycemia.    PHYSICAL EXAM: Blood pressure 119/83, pulse 73, temperature 97.9 F (36.6 C), temperature source Oral, height 5\' 3"  (1.6 m), weight 185 lb (83.9 kg), SpO2 98 %. Body mass index is 32.77 kg/m. Physical Exam Vitals signs reviewed.  Constitutional:      Appearance: Normal appearance. She is obese.  Cardiovascular:     Rate and Rhythm: Normal rate.  Pulmonary:     Effort: Pulmonary effort is normal.  Musculoskeletal: Normal range of  motion.  Skin:    General: Skin is warm and dry.  Neurological:     Mental Status: She is alert and oriented to person, place, and time.  Psychiatric:        Mood and Affect: Mood normal.        Behavior: Behavior normal.     RECENT LABS AND TESTS: BMET    Component Value Date/Time   NA 139 05/15/2019 0850   K 4.5 05/15/2019 0850   CL 100 05/15/2019 0850   CO2 23 05/15/2019 0850   GLUCOSE 98 05/15/2019 0850   GLUCOSE 88 02/26/2019 1036   BUN 12 05/15/2019 0850   CREATININE 0.64 05/15/2019 0850   CREATININE 0.75 02/26/2019 1036   CALCIUM 9.8 05/15/2019 0850   GFRNONAA 99 05/15/2019 0850   GFRNONAA >60 02/26/2019 1036   GFRAA 114 05/15/2019 0850   GFRAA >60 02/26/2019 1036   Lab Results  Component Value Date   HGBA1C 5.4 05/15/2019   Lab Results  Component Value Date   INSULIN 14.9 05/15/2019   CBC    Component Value Date/Time   WBC 5.4 05/15/2019 0850   WBC 4.9 02/26/2019 1036   WBC 6.3 09/06/2007 1520   RBC 4.40 05/15/2019 0850   RBC 4.33 02/26/2019 1036   HGB 13.3 05/15/2019 0850   HCT 41.0 05/15/2019 0850   PLT 257 05/15/2019 0850   MCV 93 05/15/2019 0850   MCH 30.2 05/15/2019 0850   MCH 30.5 02/26/2019 1036   MCHC 32.4 05/15/2019 0850   MCHC 32.4 02/26/2019 1036   RDW 12.7 05/15/2019 0850   LYMPHSABS 1.2 05/15/2019 0850   MONOABS 0.4 02/26/2019 1036   EOSABS 0.1 05/15/2019 0850   BASOSABS 0.1 05/15/2019 0850   Iron/TIBC/Ferritin/ %Sat No results found for: IRON, TIBC, FERRITIN, IRONPCTSAT Lipid Panel     Component Value Date/Time   CHOL 226 (H) 05/15/2019 0850   TRIG 251 (H) 05/15/2019 0850   HDL 70 05/15/2019 0850   LDLCALC 113 (H) 05/15/2019 0850   Hepatic Function Panel     Component Value Date/Time   PROT 7.5 05/15/2019 0850   ALBUMIN 4.5 05/15/2019 0850   AST 15 05/15/2019 0850   AST 16 02/26/2019 1036   ALT 11 05/15/2019 0850   ALT 14 02/26/2019 1036   ALKPHOS 102 05/15/2019 0850   BILITOT 0.4 05/15/2019 0850   BILITOT 0.3  02/26/2019 1036  Component Value Date/Time   TSH 1.560 05/15/2019 0850   Results for JACQUETTE, YRIGOYEN (MRN [id number]) as of 05/29/2019 09:46  Ref. Range 05/15/2019 08:50  Vitamin D, 25-Hydroxy Latest Ref Range: 30.0 - 100.0 ng/mL 19.4 (L)     OBESITY BEHAVIORAL INTERVENTION VISIT  Today's visit was # 2  Starting weight: 191 lbs Starting date: 05/15/2019 Today's weight : Weight: 185 lb (83.9 kg)  Today's date: 05/29/2019 Total lbs lost to date: 6    05/29/2019  Height 5\' 3"  (1.6 m)  Weight 185 lb (83.9 kg)  BMI (Calculated) 32.78  BLOOD PRESSURE - SYSTOLIC [id number]  BLOOD PRESSURE - DIASTOLIC 83   Body Fat % [id number] %  Total Body Water (lbs) 72.4 lbs   ASK: We discussed the diagnosis of obesity with Heather Hayes today and Heather Hayes agreed to give [name] permission to discuss obesity behavioral modification therapy today.  ASSESS: Lizbeth has the diagnosis of obesity and her BMI today is 32.78. Heather Hayes is in the action stage of change.   ADVISE: Heather Hayes was educated on the multiple health risks of obesity as well as the benefit of weight loss to improve her health. She was advised of the need for long term treatment and the importance of lifestyle modifications to improve her current health and to decrease her risk of future health problems.  AGREE: Multiple dietary modification options and treatment options were discussed and  Heather Hayes agreed to follow the recommendations documented in the above note.  ARRANGE: Heather Hayes was educated on the importance of frequent visits to treat obesity as outlined per CMS and USPSTF guidelines and agreed to schedule her next follow up appointment today.  IMarcille Blanco, CMA, am acting as transcriptionist for Starlyn Skeans, MD

## 2019-05-29 NOTE — Telephone Encounter (Signed)
  Follow up Call-  Call back number 05/25/2019  Post procedure Call Back phone  # [phone] number  Permission to leave phone message Yes  Some recent data might be hidden     Patient questions:  Second call. Message left to call us if necessary.

## 2019-05-31 ENCOUNTER — Encounter: Payer: Self-pay | Admitting: Gastroenterology

## 2019-05-31 ENCOUNTER — Other Ambulatory Visit: Payer: Self-pay | Admitting: Hematology

## 2019-06-07 NOTE — Progress Notes (Signed)
  Office: [phone]  /  Fax: [phone]    Date: June 19, 2019   Appointment Start Time: 2:30pm Duration: 24 minutes Provider: Glennie Isle, Psy.D. Type of Session: Individual Therapy  Location of Patient: Parked in car at [employer] of Provider: [facility] Type of Contact: Telepsychological Visit via [employer] Web[employer]   Session Content: Cooper is a 58 y.o. female presenting via [employer] for a follow-up appointment to address the previously established treatment goal of decreasing emotional eating. Today's appointment was a telepsychological visit, as it is an option for appointments to reduce exposure to COVID-19. Elizabeta expressed understanding regarding the rationale for telepsychological services, and provided verbal consent for today's appointment. Prior to proceeding with today's appointment, Yeilyn's physical location at the time of this appointment was obtained. In the event of technical difficulties, Marialy shared a phone number she could be reached at. Vickiana and this provider participated in today's telepsychological service. Also, Tabita denied anyone else being present in the car or on the WebEx appointment.  This provider conducted a brief check-in. Kynsleigh reported she has been "busy" since the last appointment with this provider. She noted she has lost a total of 9 pounds since starting with the clinic. Anela also reported a reduction in emotional eating despite recent stressors. Positive reinforcement was provided. Psychoeducation regarding triggers for emotional eating was provided. Morrissa was provided a handout, and encouraged to utilize the handout between now and the next appointment to increase awareness of triggers and frequency. Cniyah agreed. This provider also discussed behavioral strategies for specific triggers, such as placing the utensil down when conversing to avoid mindless eating. Amina provided verbal consent during today's appointment for this provider to  send the handout about triggers via e-mail.  Leigh was receptive to today's session as evidenced by openness to sharing, responsiveness to feedback, and willingness to explore triggers for emotional eating.  Mental Status Examination:  Appearance: neat Behavior: cooperative Mood: euthymic Affect: mood congruent Speech: normal in rate, volume, and tone Eye Contact: appropriate Psychomotor Activity: appropriate Thought Process: linear, logical, and goal directed  Content/Perceptual Disturbances: no hallucinations, delusions, bizarre thinking or behavior reported or observed and no evidence of suicidal and homicidal ideation, plan, and intent Orientation: time, person, place and purpose of appointment Cognition/Sensorium: memory, attention, language, and fund of knowledge intact  Insight: good Judgment: good  Interventions:  Conducted a brief chart review Provided empathic reflections and validation Reviewed content from the previous session Psychoeducation provided regarding triggers for emotional eating Employed motivational interviewing skills to assess patient's willingness/desire to adhere to recommended medical treatments and assignments Focused on rapport building Employed supportive psychotherapy interventions to facilitate reduced distress, and to improve coping skills with identified stressors Provided positive reinforcement  DSM-5 Diagnosis: 300.09 (F41.8) Other Specified Anxiety Disorder, Emotional Eating Behaviors  Treatment Goal & Progress: During the initial appointment with this provider, the following treatment goal was established: decrease emotional eating. Pagan has demonstrated progress in her goal as evidenced by increased awareness of hunger patterns. Jeanita also reported a reduction in emotional eating.  Plan: Santasha continues to appear able and willing to participate as evidenced by engagement in reciprocal conversation, and asking questions for clarification as  appropriate. The next appointment will be scheduled in three weeks, which will be via [employer]. The next session will focus on the introduction of mindfulness.

## 2019-06-12 ENCOUNTER — Encounter (INDEPENDENT_AMBULATORY_CARE_PROVIDER_SITE_OTHER): Payer: Self-pay | Admitting: Family Medicine

## 2019-06-12 ENCOUNTER — Ambulatory Visit (INDEPENDENT_AMBULATORY_CARE_PROVIDER_SITE_OTHER): Payer: 59 | Admitting: Family Medicine

## 2019-06-12 ENCOUNTER — Other Ambulatory Visit: Payer: Self-pay

## 2019-06-12 VITALS — BP 112/74 | HR 69 | Temp 98.0°F | Ht 63.0 in | Wt 182.0 lb

## 2019-06-12 DIAGNOSIS — Z6832 Body mass index (BMI) 32.0-32.9, adult: Secondary | ICD-10-CM

## 2019-06-12 DIAGNOSIS — Z9189 Other specified personal risk factors, not elsewhere classified: Secondary | ICD-10-CM | POA: Diagnosis not present

## 2019-06-12 DIAGNOSIS — E559 Vitamin D deficiency, unspecified: Secondary | ICD-10-CM

## 2019-06-12 DIAGNOSIS — E669 Obesity, unspecified: Secondary | ICD-10-CM

## 2019-06-12 MED ORDER — VITAMIN D (ERGOCALCIFEROL) 1.25 MG (50000 UNIT) PO CAPS: 50000.0000 [IU] | ORAL_CAPSULE | ORAL | 0 refills | Status: DC

## 2019-06-12 NOTE — Progress Notes (Signed)
Office: [phone]  /  Fax: [phone]   HPI:   Chief Complaint: OBESITY Heather Hayes is here to discuss her progress with her obesity treatment plan. She is on the Category 2 plan and is following her eating plan approximately 90% of the time. She states she is exercising 0 minutes 0 times per week. Heather Hayes continues to do very well with weight loss on the Category 2 plan. She denies feeling deprived and hunger is controlled. She has questions about which microwave meals are right for her.  Her weight is 182 lb (82.6 kg) today and has had a weight loss of 3 pounds over a period of 2 weeks since her last visit. She has lost 9 lbs since starting treatment with [name].  Vitamin D deficiency Heather Hayes has a diagnosis of Vitamin D deficiency. She started taking prescription Vit D and feels her fatigue is improving already. She denies nausea, vomiting or muscle weakness.  At risk for osteopenia and osteoporosis Heather Hayes is at higher risk of osteopenia and osteoporosis due to Vitamin D deficiency.   ASSESSMENT AND PLAN:  Vitamin D deficiency - Plan: Vitamin D, Ergocalciferol, (DRISDOL) 1.25 MG (50000 UT) CAPS capsule  At risk for osteoporosis  Class 1 obesity with serious comorbidity and body mass index (BMI) of 32.0 to 32.9 in adult, unspecified obesity type  PLAN:  Vitamin D Deficiency Heather Hayes was informed that low Vitamin D levels contributes to fatigue and are associated with obesity, breast, and colon cancer. She agrees to continue to take prescription Vit D @ 50,000 IU every week #4 with 0 refillsand will follow-up for routine testing of Vitamin D, at least 2-3 times per year. She was informed of the risk of over-replacement of Vitamin D and agrees to not increase her dose unless she discusses this with [name] first. Heather Hayes agrees to follow-up with our clinic in 2 weeks.  At risk for osteopenia and osteoporosis Heather Hayes was given extended  (15 minutes) osteoporosis prevention counseling today. Heather Hayes is at risk for  osteopenia and osteoporosis due to her Vitamin D deficiency. She was encouraged to take her Vitamin D and follow her higher calcium diet and increase strengthening exercise to help strengthen her bones and decrease her risk of osteopenia and osteoporosis.  Obesity Heather Hayes is currently in the action stage of change. As such, her goal is to continue with weight loss efforts. She has agreed to follow the Category 2 plan with breakfast options. Microwave Meal handout was given. Heather Hayes has been instructed to work up to a goal of 150 minutes of combined cardio and strengthening exercise per week for weight loss and overall health benefits. We discussed the following Behavioral Modification Strategies today: work on meal planning and easy cooking plans.  Heather Hayes has agreed to follow-up with our clinic in 2 weeks. She was informed of the importance of frequent follow-up visits to maximize her success with intensive lifestyle modifications for her multiple health conditions.  ALLERGIES: Allergies  Allergen Reactions  . Compazine [Prochlorperazine Edisylate]     Muscle contractures     MEDICATIONS: Current Outpatient Medications on File Prior to Visit  Medication Sig Dispense Refill  . anastrozole (ARIMIDEX) 1 MG tablet TAKE 1 TABLET BY MOUTH EVERY DAY 90 tablet 3  . clonazePAM (KLONOPIN) 0.5 MG tablet Take 0.5 mg by mouth 2 (two) times daily as needed for anxiety.    . meloxicam (MOBIC) 15 MG tablet Take 15 mg by mouth daily.     No current facility-administered medications on file  prior to visit.     PAST MEDICAL HISTORY: Past Medical History:  Diagnosis Date  . Anxiety   . Arthritis   . Back pain   . Bursitis   . Cancer [facility])    right breast cancer 09-2018  . Constipation   . GERD (gastroesophageal reflux disease)    some heart burn from Arimidex  . History of radiation therapy    completed 01-29-2019  . Hyperlipidemia   . Joint pain   . Rheumatic fever   . Swallowing difficulty      PAST SURGICAL HISTORY: Past Surgical History:  Procedure Laterality Date  . BREAST LUMPECTOMY WITH RADIOACTIVE SEED AND SENTINEL LYMPH NODE BIOPSY Right 10/27/2018   Procedure: RIGHT BREAST LUMPECTOMY WITH RADIOACTIVE SEED AND SENTINEL LYMPH NODE BIOPSY;  Surgeon: Excell Seltzer, MD;  Location: [facility];  Service: General;  Laterality: Right;  . CERVICAL SPINE SURGERY  2009  . COLONOSCOPY    . ENDOMETRIAL ABLATION    . POLYPECTOMY    . TUBAL LIGATION    . tube ligation       SOCIAL HISTORY: Social History   Tobacco Use  . Smoking status: Never Smoker  . Smokeless tobacco: Never Used  Substance Use Topics  . Alcohol use: Yes    Alcohol/week: 1.0 - 2.0 standard drinks    Types: 1 - 2 Glasses of wine per week  . Drug use: Never    FAMILY HISTORY: Family History  Problem Relation Age of Onset  . Breast cancer Mother 77  . Diabetes Mother   . Hypertension Mother   . Hyperlipidemia Mother   . Obesity Mother   . Diabetes Father   . Hypertension Father   . Hyperlipidemia Father   . Ovarian cancer Maternal Grandmother   . Colon cancer Neg Hx   . Esophageal cancer Neg Hx   . Rectal cancer Neg Hx   . Stomach cancer Neg Hx    ROS: Review of Systems  Gastrointestinal: Negative for nausea and vomiting.  Musculoskeletal:       Negative for muscle weakness.   PHYSICAL EXAM: Blood pressure 112/74, pulse 69, temperature 98 F (36.7 C), temperature source Oral, height 5\' 3"  (1.6 m), weight 182 lb (82.6 kg), SpO2 99 %. Body mass index is 32.24 kg/m. Physical Exam Vitals signs reviewed.  Constitutional:      Appearance: Normal appearance. She is obese.  Cardiovascular:     Rate and Rhythm: Normal rate.     Pulses: Normal pulses.  Pulmonary:     Effort: Pulmonary effort is normal.     Breath sounds: Normal breath sounds.  Musculoskeletal: Normal range of motion.  Skin:    General: Skin is warm and dry.  Neurological:     Mental Status: She is alert  and oriented to person, place, and time.  Psychiatric:        Behavior: Behavior normal.   RECENT LABS AND TESTS: BMET    Component Value Date/Time   NA 139 05/15/2019 0850   K 4.5 05/15/2019 0850   CL 100 05/15/2019 0850   CO2 23 05/15/2019 0850   GLUCOSE 98 05/15/2019 0850   GLUCOSE 88 02/26/2019 1036   BUN 12 05/15/2019 0850   CREATININE 0.64 05/15/2019 0850   CREATININE 0.75 02/26/2019 1036   CALCIUM 9.8 05/15/2019 0850   GFRNONAA 99 05/15/2019 0850   GFRNONAA >60 02/26/2019 1036   GFRAA 114 05/15/2019 0850   GFRAA >60 02/26/2019 1036   Lab Results  Component Value Date   HGBA1C 5.4 05/15/2019   Lab Results  Component Value Date   INSULIN 14.9 05/15/2019   CBC    Component Value Date/Time   WBC 5.4 05/15/2019 0850   WBC 4.9 02/26/2019 1036   WBC 6.3 09/06/2007 1520   RBC 4.40 05/15/2019 0850   RBC 4.33 02/26/2019 1036   HGB 13.3 05/15/2019 0850   HCT 41.0 05/15/2019 0850   PLT 257 05/15/2019 0850   MCV 93 05/15/2019 0850   MCH 30.2 05/15/2019 0850   MCH 30.5 02/26/2019 1036   MCHC 32.4 05/15/2019 0850   MCHC 32.4 02/26/2019 1036   RDW 12.7 05/15/2019 0850   LYMPHSABS 1.2 05/15/2019 0850   MONOABS 0.4 02/26/2019 1036   EOSABS 0.1 05/15/2019 0850   BASOSABS 0.1 05/15/2019 0850   Iron/TIBC/Ferritin/ %Sat No results found for: IRON, TIBC, FERRITIN, IRONPCTSAT Lipid Panel     Component Value Date/Time   CHOL 226 (H) 05/15/2019 0850   TRIG 251 (H) 05/15/2019 0850   HDL 70 05/15/2019 0850   LDLCALC 113 (H) 05/15/2019 0850   Hepatic Function Panel     Component Value Date/Time   PROT 7.5 05/15/2019 0850   ALBUMIN 4.5 05/15/2019 0850   AST 15 05/15/2019 0850   AST 16 02/26/2019 1036   ALT 11 05/15/2019 0850   ALT 14 02/26/2019 1036   ALKPHOS 102 05/15/2019 0850   BILITOT 0.4 05/15/2019 0850   BILITOT 0.3 02/26/2019 1036      Component Value Date/Time   TSH 1.560 05/15/2019 0850   Results for JISSELLE, KRIEGEL (MRN [id number]) as of 06/12/2019  15:44  Ref. Range 05/15/2019 08:50  Vitamin D, 25-Hydroxy Latest Ref Range: 30.0 - 100.0 ng/mL 19.4 (L)   OBESITY BEHAVIORAL INTERVENTION VISIT  Today's visit was #3  Starting weight: 191 lbs Starting date: 05/15/2019 Today's weight: 182 lbs  Today's date: 06/12/2019 Total lbs lost to date: 9    06/12/2019  Height 5\' 3"  (1.6 m)  Weight 182 lb (82.6 kg)  BMI (Calculated) 32.25  BLOOD PRESSURE - SYSTOLIC [id number]  BLOOD PRESSURE - DIASTOLIC 74   Body Fat % 43 %  Total Body Water (lbs) 71.4 lbs   ASK: We discussed the diagnosis of obesity with Sinda Du today and Gerda agreed to give [name] permission to discuss obesity behavioral modification therapy today.  ASSESS: Rya has the diagnosis of obesity and her BMI today is 32.3. Loydene is in the action stage of change.   ADVISE: Deneane was educated on the multiple health risks of obesity as well as the benefit of weight loss to improve her health. She was advised of the need for long term treatment and the importance of lifestyle modifications to improve her current health and to decrease her risk of future health problems.  AGREE: Multiple dietary modification options and treatment options were discussed and  Shiona agreed to follow the recommendations documented in the above note.  ARRANGE: Joene was educated on the importance of frequent visits to treat obesity as outlined per CMS and USPSTF guidelines and agreed to schedule her next follow up appointment today.  I, Michaelene Song, am acting as Location manager for Dennard Nip, MD  I have reviewed the above documentation for accuracy and completeness, and I agree with the above. -Dennard Nip, MD

## 2019-06-19 ENCOUNTER — Other Ambulatory Visit: Payer: Self-pay

## 2019-06-19 ENCOUNTER — Ambulatory Visit (INDEPENDENT_AMBULATORY_CARE_PROVIDER_SITE_OTHER): Payer: 59 | Admitting: Psychology

## 2019-06-19 DIAGNOSIS — F418 Other specified anxiety disorders: Secondary | ICD-10-CM | POA: Diagnosis not present

## 2019-06-26 ENCOUNTER — Other Ambulatory Visit: Payer: Self-pay

## 2019-06-26 ENCOUNTER — Ambulatory Visit (INDEPENDENT_AMBULATORY_CARE_PROVIDER_SITE_OTHER): Payer: 59 | Admitting: Family Medicine

## 2019-06-26 ENCOUNTER — Encounter (INDEPENDENT_AMBULATORY_CARE_PROVIDER_SITE_OTHER): Payer: Self-pay | Admitting: Family Medicine

## 2019-06-26 VITALS — BP 116/75 | HR 67 | Temp 98.0°F | Ht 63.0 in | Wt 180.0 lb

## 2019-06-26 DIAGNOSIS — E559 Vitamin D deficiency, unspecified: Secondary | ICD-10-CM | POA: Diagnosis not present

## 2019-06-26 DIAGNOSIS — E8881 Metabolic syndrome: Secondary | ICD-10-CM

## 2019-06-26 DIAGNOSIS — Z9189 Other specified personal risk factors, not elsewhere classified: Secondary | ICD-10-CM

## 2019-06-26 DIAGNOSIS — E7849 Other hyperlipidemia: Secondary | ICD-10-CM | POA: Diagnosis not present

## 2019-06-26 DIAGNOSIS — E669 Obesity, unspecified: Secondary | ICD-10-CM

## 2019-06-26 DIAGNOSIS — Z6832 Body mass index (BMI) 32.0-32.9, adult: Secondary | ICD-10-CM

## 2019-06-27 NOTE — Progress Notes (Signed)
Office: [phone]  /  Fax: [phone]   HPI:   Chief Complaint: OBESITY Heather Hayes is here to discuss her progress with her obesity treatment plan. She is on the Category 2 plan with breakfast options and is following her eating plan approximately 90 % of the time. She states she is on the treadmill for 30 minutes 5 times per week. Heather Hayes is doing well and is focused on protein. She is able to get rid of all or nothing mentality. She enjoyed her overnight stay at [facility] with her husband. She had a great time and made good food choices. Her weight is 180 lb (81.6 kg) today and has had a weight loss of 2 pounds over a period of 2 weeks since her last visit. She has lost 11 lbs since starting treatment with [name].  Vitamin D Deficiency Heather Hayes has a diagnosis of vitamin D deficiency. She is currently taking prescription Vit D and denies nausea, vomiting or muscle weakness.  At risk for osteopenia and osteoporosis Heather Hayes is at higher risk of osteopenia and osteoporosis due to vitamin D deficiency.   Insulin Resistance Heather Hayes has a diagnosis of insulin resistance based on her elevated fasting insulin level >5. Although Heather Hayes's blood glucose readings are still under good control, insulin resistance puts her at greater risk of metabolic syndrome and diabetes. She is not taking metformin currently and continues to work on diet and exercise to decrease risk of diabetes.  Hyperlipidemia Heather Hayes has hyperlipidemia and has been trying to improve her cholesterol levels with intensive lifestyle modification including a low saturated fat diet, exercise and weight loss. She denies any chest pain, claudication or myalgias.  ASSESSMENT AND PLAN:  Vitamin D deficiency  Insulin resistance  Other hyperlipidemia  At risk for osteoporosis  Class 1 obesity with serious comorbidity and body mass index (BMI) of 32.0 to 32.9 in adult, unspecified obesity type  PLAN:  Vitamin D Deficiency Heather Hayes was informed that low  vitamin D levels contributes to fatigue and are associated with obesity, breast, and colon cancer. Heather Hayes agrees to continue taking prescription Vit D 50,000 IU every week and will follow up for routine testing of vitamin D, at least 2-3 times per year. She was informed of the risk of over-replacement of vitamin D and agrees to not increase her dose unless she discusses this with [name] first. Heather Hayes agrees to follow up with our clinic in 2 weeks.  At risk for osteopenia and osteoporosis Heather Hayes was given extended (15 minutes) osteoporosis prevention counseling today. Heather Hayes is at risk for osteopenia and osteoporsis due to her vitamin D deficiency. She was encouraged to take her vitamin D and follow her higher calcium diet and increase strengthening exercise to help strengthen her bones and decrease her risk of osteopenia and osteoporosis.  Insulin Resistance Heather Hayes will continue to work on weight loss, exercise, and decreasing simple carbohydrates in her diet to help decrease the risk of diabetes. We dicussed metformin including benefits and risks. She was informed that eating too many simple carbohydrates or too many calories at one sitting increases the likelihood of GI side effects. Heather Hayes agrees to follow up with [name] as directed to monitor her progress.  Hyperlipidemia Heather Hayes was informed of the American Heart Association Guidelines emphasizing intensive lifestyle modifications as the first line treatment for hyperlipidemia. We discussed many lifestyle modifications today in depth, and Heather Hayes will continue to work on decreasing saturated fats such as fatty red meat, butter and many fried foods. She will also increase  vegetables and lean protein in her diet and continue to work on exercise and weight loss efforts. We will continue to monitor.  Obesity Heather Hayes is currently in the action stage of change. As such, her goal is to continue with weight loss efforts She has agreed to follow the Category 2 plan with breakfast  options Heather Hayes has been instructed to work up to a goal of 150 minutes of combined cardio and strengthening exercise per week or as tolerated for weight loss and overall health benefits. We discussed the following Behavioral Modification Strategies today: increasing lean protein intake, decreasing simple carbohydrates, increasing vegetables, increase H20 intake, work on meal planning and easy cooking plans, holiday eating strategies, and celebration eating strategies   Heather Hayes has agreed to follow up with our clinic in 2 weeks. She was informed of the importance of frequent follow up visits to maximize her success with intensive lifestyle modifications for her multiple health conditions.  ALLERGIES: Allergies  Allergen Reactions  . Compazine [Prochlorperazine Edisylate]     Muscle contractures     MEDICATIONS: Current Outpatient Medications on File Prior to Visit  Medication Sig Dispense Refill  . anastrozole (ARIMIDEX) 1 MG tablet TAKE 1 TABLET BY MOUTH EVERY DAY 90 tablet 3  . clonazePAM (KLONOPIN) 0.5 MG tablet Take 0.5 mg by mouth 2 (two) times daily as needed for anxiety.    . meloxicam (MOBIC) 15 MG tablet Take 15 mg by mouth daily.    . Vitamin D, Ergocalciferol, (DRISDOL) 1.25 MG (50000 UT) CAPS capsule Take 1 capsule (50,000 Units total) by mouth every 7 (seven) days. 4 capsule 0   No current facility-administered medications on file prior to visit.     PAST MEDICAL HISTORY: Past Medical History:  Diagnosis Date  . Anxiety   . Arthritis   . Back pain   . Bursitis   . Cancer [facility])    right breast cancer 09-2018  . Constipation   . GERD (gastroesophageal reflux disease)    some heart burn from Arimidex  . History of radiation therapy    completed 01-29-2019  . Hyperlipidemia   . Joint pain   . Rheumatic fever   . Swallowing difficulty     PAST SURGICAL HISTORY: Past Surgical History:  Procedure Laterality Date  . BREAST LUMPECTOMY WITH RADIOACTIVE SEED AND SENTINEL  LYMPH NODE BIOPSY Right 10/27/2018   Procedure: RIGHT BREAST LUMPECTOMY WITH RADIOACTIVE SEED AND SENTINEL LYMPH NODE BIOPSY;  Surgeon: Excell Seltzer, MD;  Location: [facility];  Service: General;  Laterality: Right;  . CERVICAL SPINE SURGERY  2009  . COLONOSCOPY    . ENDOMETRIAL ABLATION    . POLYPECTOMY    . TUBAL LIGATION    . tube ligation       SOCIAL HISTORY: Social History   Tobacco Use  . Smoking status: Never Smoker  . Smokeless tobacco: Never Used  Substance Use Topics  . Alcohol use: Yes    Alcohol/week: 1.0 - 2.0 standard drinks    Types: 1 - 2 Glasses of wine per week  . Drug use: Never    FAMILY HISTORY: Family History  Problem Relation Age of Onset  . Breast cancer Mother 37  . Diabetes Mother   . Hypertension Mother   . Hyperlipidemia Mother   . Obesity Mother   . Diabetes Father   . Hypertension Father   . Hyperlipidemia Father   . Ovarian cancer Maternal Grandmother   . Colon cancer Neg Hx   .  Esophageal cancer Neg Hx   . Rectal cancer Neg Hx   . Stomach cancer Neg Hx     ROS: Review of Systems  Constitutional: Positive for weight loss.  Cardiovascular: Negative for chest pain and claudication.  Gastrointestinal: Negative for nausea and vomiting.  Musculoskeletal: Negative for myalgias.       Negative muscle weakness    PHYSICAL EXAM: Blood pressure 116/75, pulse 67, temperature 98 F (36.7 C), temperature source Oral, height 5\' 3"  (1.6 m), weight 180 lb (81.6 kg), SpO2 100 %. Body mass index is 31.89 kg/m. Physical Exam Vitals signs reviewed.  Constitutional:      Appearance: Normal appearance. She is obese.  Cardiovascular:     Rate and Rhythm: Normal rate.     Pulses: Normal pulses.  Pulmonary:     Effort: Pulmonary effort is normal.     Breath sounds: Normal breath sounds.  Musculoskeletal: Normal range of motion.  Skin:    General: Skin is warm and dry.  Neurological:     Mental Status: She is alert and  oriented to person, place, and time.  Psychiatric:        Mood and Affect: Mood normal.        Behavior: Behavior normal.     RECENT LABS AND TESTS: BMET    Component Value Date/Time   NA 139 05/15/2019 0850   K 4.5 05/15/2019 0850   CL 100 05/15/2019 0850   CO2 23 05/15/2019 0850   GLUCOSE 98 05/15/2019 0850   GLUCOSE 88 02/26/2019 1036   BUN 12 05/15/2019 0850   CREATININE 0.64 05/15/2019 0850   CREATININE 0.75 02/26/2019 1036   CALCIUM 9.8 05/15/2019 0850   GFRNONAA 99 05/15/2019 0850   GFRNONAA >60 02/26/2019 1036   GFRAA 114 05/15/2019 0850   GFRAA >60 02/26/2019 1036   Lab Results  Component Value Date   HGBA1C 5.4 05/15/2019   Lab Results  Component Value Date   INSULIN 14.9 05/15/2019   CBC    Component Value Date/Time   WBC 5.4 05/15/2019 0850   WBC 4.9 02/26/2019 1036   WBC 6.3 09/06/2007 1520   RBC 4.40 05/15/2019 0850   RBC 4.33 02/26/2019 1036   HGB 13.3 05/15/2019 0850   HCT 41.0 05/15/2019 0850   PLT 257 05/15/2019 0850   MCV 93 05/15/2019 0850   MCH 30.2 05/15/2019 0850   MCH 30.5 02/26/2019 1036   MCHC 32.4 05/15/2019 0850   MCHC 32.4 02/26/2019 1036   RDW 12.7 05/15/2019 0850   LYMPHSABS 1.2 05/15/2019 0850   MONOABS 0.4 02/26/2019 1036   EOSABS 0.1 05/15/2019 0850   BASOSABS 0.1 05/15/2019 0850   Iron/TIBC/Ferritin/ %Sat No results found for: IRON, TIBC, FERRITIN, IRONPCTSAT Lipid Panel     Component Value Date/Time   CHOL 226 (H) 05/15/2019 0850   TRIG 251 (H) 05/15/2019 0850   HDL 70 05/15/2019 0850   LDLCALC 113 (H) 05/15/2019 0850   Hepatic Function Panel     Component Value Date/Time   PROT 7.5 05/15/2019 0850   ALBUMIN 4.5 05/15/2019 0850   AST 15 05/15/2019 0850   AST 16 02/26/2019 1036   ALT 11 05/15/2019 0850   ALT 14 02/26/2019 1036   ALKPHOS 102 05/15/2019 0850   BILITOT 0.4 05/15/2019 0850   BILITOT 0.3 02/26/2019 1036      Component Value Date/Time   TSH 1.560 05/15/2019 0850      OBESITY BEHAVIORAL  INTERVENTION VISIT  Today's visit was # 4  Starting weight: 191 lbs Starting date: 05/15/2019 Today's weight : 180 lbs  Today's date: 06/26/2019 Total lbs lost to date: 11    ASK: We discussed the diagnosis of obesity with Sinda Du today and Carliss agreed to give [name] permission to discuss obesity behavioral modification therapy today.  ASSESS: Zanai has the diagnosis of obesity and her BMI today is 31.89 Patriciajo is in the action stage of change   ADVISE: Christiana was educated on the multiple health risks of obesity as well as the benefit of weight loss to improve her health. She was advised of the need for long term treatment and the importance of lifestyle modifications to improve her current health and to decrease her risk of future health problems.  AGREE: Multiple dietary modification options and treatment options were discussed and  Bekki agreed to follow the recommendations documented in the above note.  ARRANGE: Izelle was educated on the importance of frequent visits to treat obesity as outlined per CMS and USPSTF guidelines and agreed to schedule her next follow up appointment today.  Wilhemena Durie, am acting as transcriptionist for Briscoe Deutscher, DO  I have reviewed the above documentation for accuracy and completeness, and I agree with the above. Briscoe Deutscher, DO

## 2019-06-29 ENCOUNTER — Telehealth: Payer: Self-pay | Admitting: Nurse Practitioner

## 2019-06-29 NOTE — Telephone Encounter (Signed)
Called patient regarding 12/07 appointment, left a voicemail.

## 2019-07-02 ENCOUNTER — Encounter (INDEPENDENT_AMBULATORY_CARE_PROVIDER_SITE_OTHER): Payer: Self-pay | Admitting: Family Medicine

## 2019-07-02 ENCOUNTER — Telehealth: Payer: Self-pay | Admitting: Nurse Practitioner

## 2019-07-02 NOTE — Progress Notes (Signed)
  Office: [phone]  /  Fax: [phone]    Date: July 10, 2019   Appointment Start Time: 4:04pm Duration: 22 minutes Provider: Glennie Isle, Psy.D. Type of Session: Individual Therapy  Location of Patient: Work Biomedical scientist of Provider: [facility] Type of Contact: Telepsychological Visit via [employer]  Session Content: Heather Hayes is a 58 y.o. female presenting via [place] for a follow-up appointment to address the previously established treatment goal of decreasing emotional eating. Today's appointment was a telepsychological visit due to COVID-19. Heather Hayes provided verbal consent for today's telepsychological appointment and she is aware she is responsible for securing confidentiality on her end of the session. Prior to proceeding with today's appointment, Heather Hayes's physical location at the time of this appointment was obtained as well a phone number she could be reached at in the event of technical difficulties. Heather Hayes and this provider participated in today's telepsychological service.   This provider conducted a brief check-in. Heather Hayes shared about recent events and noted she lost two additional pounds. She added, "It's been going good." Heather Hayes denied episodes of emotional eating since the last appointment with this provider. Positive reinforcement was provided. Psychoeducation regarding mindfulness was provided. A handout was provided to [facility] with further information regarding mindfulness, including exercises. This provider also explained the benefit of mindfulness as it relates to emotional eating. [name] was encouraged to engage in the provided exercises between now and the next appointment with this provider. Heather Hayes agreed. During today's appointment, Heather Hayes was led through a mindfulness exercise involving her senses. She shared, "I think that sounds great." Heather Hayes provided verbal consent during today's appointment for this provider to send a handout about mindfulness via e-mail. Heather Hayes was  receptive to today's appointment as evidenced by openness to sharing, responsiveness to feedback, and willingness to engage in mindfulness exercises to assist with coping.  Mental Status Examination:  Appearance: appropriate grooming and hygiene and casually dressed Behavior: appropriate to circumstances Mood: euthymic Affect: mood congruent Speech: normal in rate, volume, and tone Eye Contact: appropriate Psychomotor Activity: appropriate Gait: unable to assess Thought Process: linear, logical, and goal directed  Thought Content/Perception: no hallucinations, delusions, bizarre thinking or behavior reported or observed and no evidence of suicidal and homicidal ideation, plan, and intent Orientation: time, person, place and purpose of appointment Memory/Concentration: memory, attention, language, and fund of knowledge intact  Insight/Judgment: good  Interventions:  Conducted a brief chart review Provided empathic reflections and validation Reviewed content from the previous session Provided positive reinforcement Employed supportive psychotherapy interventions to facilitate reduced distress and to improve coping skills with identified stressors Employed motivational interviewing skills to assess patient's willingness/desire to adhere to recommended medical treatments and assignments Psychoeducation provided regarding mindfulness Engaged patient in mindfulness exercise(s) Employed acceptance and commitment interventions to emphasize mindfulness and acceptance without struggle  DSM-5 Diagnosis: 300.09 (F41.8) Other Specified Anxiety Disorder, Emotional Eating Behaviors  Treatment Goal & Progress: During the initial appointment with this provider, the following treatment goal was established: decrease emotional eating. Heather Hayes has demonstrated progress in her goal as evidenced by increased awareness of hunger patterns, triggers for emotional eating and reduction in emotional eating. Heather Hayes also  demonstrates willingness to engage in mindfulness exercises.  Plan: Due to the upcoming holidays and this provider being out of the office in the coming weeks, the next appointment will be scheduled in approximately one month, which will be via [employer]. The next session will focus on mindfulness.

## 2019-07-02 NOTE — Telephone Encounter (Signed)
Patient returned call regarding voicemail that was left, patient agreed to do virtual visit.

## 2019-07-09 ENCOUNTER — Encounter (INDEPENDENT_AMBULATORY_CARE_PROVIDER_SITE_OTHER): Payer: Self-pay

## 2019-07-09 ENCOUNTER — Encounter: Payer: Self-pay | Admitting: Nurse Practitioner

## 2019-07-09 ENCOUNTER — Encounter: Payer: 59 | Admitting: Nurse Practitioner

## 2019-07-09 ENCOUNTER — Inpatient Hospital Stay: Payer: 59 | Attending: Hematology | Admitting: Nurse Practitioner

## 2019-07-09 DIAGNOSIS — Z170 Estrogen receptor positive status [ER+]: Secondary | ICD-10-CM

## 2019-07-09 DIAGNOSIS — C50411 Malignant neoplasm of upper-outer quadrant of right female breast: Secondary | ICD-10-CM | POA: Diagnosis not present

## 2019-07-09 NOTE — Progress Notes (Signed)
[facility]   [phone] [phone]   Clinic Follow up Note   Patient Care Team: Molli Posey, MD as PCP - General (Obstetrics and Gynecology) Mauro Kaufmann, RN as Oncology Nurse Navigator Rockwell Germany, RN as Oncology Nurse Navigator Excell Seltzer, MD (Inactive) as Consulting Physician (General Surgery) Truitt Merle, MD as Consulting Physician (Hematology) Gery Pray, MD as Consulting Physician (Radiation Oncology) Alla Feeling, NP as Nurse Practitioner (Nurse Practitioner) 07/09/2019   I connected with Sinda Du on 07/09/19 at 10:00 AM EST by video-enabled Mychart video and verified that I am speaking with the correct person using two identifiers.  I discussed the limitations, risks, security and privacy concerns of performing an evaluation and management service by telephone and the availability of in person appointments. I also discussed with the patient that there may be a patient responsible charge related to this service. The patient expressed understanding and agreed to proceed.   CC: SCP clinic    BRIEF ONCOLOGIC HISTORY:  Oncology History Overview Note  Cancer Staging Malignant neoplasm of upper-outer quadrant of right breast in female, estrogen receptor positive ([place]) Staging form: Breast, AJCC 8th Edition - Clinical stage from 10/02/2018: Stage IA (cT1b, cN0, cM0, G2, ER+, PR+, HER2-) - Signed by Truitt Merle, MD on 10/10/2018 - Pathologic stage from 10/27/2018: Stage IA (pT1b, pN0, cM0, G1, ER+, PR+, HER2-) - Signed by Truitt Merle, MD on 02/26/2019     Malignant neoplasm of upper-outer quadrant of right breast in female, estrogen receptor positive ([place])  09/27/2018 Mammogram   [facility] Mammogram 09/27/18  IMPRESSION: 1. Suspicious right breast mass measures 7 x 7 x 6 mm with associated vascularity at the 12 o'clock position 7 cm from the nipple. 2. No suspicious right axillary lymphadenopathy.   10/02/2018 Cancer  Staging   Staging form: Breast, AJCC 8th Edition - Clinical stage from 10/02/2018: Stage IA (cT1b, cN0, cM0, G2, ER+, PR+, HER2-) - Signed by Truitt Merle, MD on 10/10/2018   10/02/2018 Initial Biopsy   Diagnosis 10/02/18 Breast, right, needle core biopsy, 12 o'clock - INVASIVE DUCTAL CARCINOMA.   10/02/2018 Receptors her2   Results: IMMUNOHISTOCHEMICAL AND MORPHOMETRIC ANALYSIS PERFORMED MANUALLY The tumor cells are NEGATIVE for Her2 (1+). Estrogen Receptor: 95%, POSITIVE, STRONG STAINING INTENSITY Progesterone Receptor: 95%, POSITIVE, STRONG STAINING INTENSITY Proliferation Marker Ki67: 5%   10/04/2018 Initial Diagnosis   Malignant neoplasm of upper-outer quadrant of right breast in female, estrogen receptor positive ([place])   10/27/2018 Surgery   RIGHT BREAST LUMPECTOMY WITH RADIOACTIVE SEED AND SENTINEL LYMPH NODE BIOPSY by Dr Excell Seltzer   10/27/2018 Pathology Results   Diagnosis 10/27/18 1. Breast, lumpectomy, Right - INVASIVE DUCTAL CARCINOMA WITH CALCIFICATIONS, GRADE I/III, SPANNING 0.7 CM. - THE SURGICAL RESECTION MARGIN ARE NEGATIVE FOR CARCINOMA. - SEE ONCOLOGY TABLE BELOW. 2. Lymph node, sentinel, biopsy, Right - THERE IS NO EVIDENCE OF CARCINOMA IN 1 OF 1 LYMPH NODE (0/1). 3. Lymph node, sentinel, biopsy, Right - THERE IS NO EVIDENCE OF CARCINOMA IN 1 OF 1 LYMPH NODE (0/1).    - 01/29/2019 Radiation Therapy   Radiation therapy at [facility] on 01/29/19.    10/27/2018 Cancer Staging   Staging form: Breast, AJCC 8th Edition - Pathologic stage from 10/27/2018: Stage IA (pT1b, pN0, cM0, G1, ER+, PR+, HER2-) - Signed by Truitt Merle, MD on 02/26/2019   03/2019 -  Anti-estrogen oral therapy   Anastrozole 52m daily starting in 03/2019    07/09/2019 Survivorship   SCP delivered by LRegan Rakers  Kalman Shan, NP      INTERVAL HISTORY:  Ms. Buhman to review her survivorship care plan detailing her treatment course for breast cancer, as well as monitoring long-term side effects of that treatment,  education regarding health maintenance, screening, and overall wellness and health promotion.     Overall, Ms. Millirons reports feeling well in general. She did well for first 4 weeks of RT then last 2 weeks got a "nasty burn" that is healing well. The right breast is a little bigger, she is more comfortable in shirts with built in bras. Otherwise denies new lump, nipple discharge/inversion or breast concerns. She started working with weight management clinic and has lost 11 lbs intentionally. She is on high protein low sugar diet. In the process she discovered she is pre-diabetic. She is walking more. She continues anastrozole, tolerating well. She has mild hot flashes at night. She has mild joint pain at baseline in right hip and shoulders as well as bursitis. Overall not much worse since starting AI; denies new pain. She is on vitamin D but not on calcium. She has some mood fluctuation, gets "antsy and anxious" at times. Takes klonopin PRN. Otherwise, denies recent fever, chills, cough, chest pain, dyspnea, leg swelling, lymphedema, change in bowel habits, GI/GYN bleeding, or other concerns.    ONCOLOGY TREATMENT TEAM:  1. Surgeon:  Dr. Excell Seltzer at [facility] 2. Medical Oncologist: Dr. Burr Medico  3. Radiation Oncologist: Dr. Sondra Come    PAST MEDICAL/SURGICAL HISTORY:  Past Medical History:  Diagnosis Date  . Anxiety   . Arthritis   . Back pain   . Bursitis   . Cancer [facility])    right breast cancer 09-2018  . Constipation   . GERD (gastroesophageal reflux disease)    some heart burn from Arimidex  . History of radiation therapy    completed 01-29-2019  . Hyperlipidemia   . Joint pain   . Rheumatic fever   . Swallowing difficulty    Past Surgical History:  Procedure Laterality Date  . BREAST LUMPECTOMY WITH RADIOACTIVE SEED AND SENTINEL LYMPH NODE BIOPSY Right 10/27/2018   Procedure: RIGHT BREAST LUMPECTOMY WITH RADIOACTIVE SEED AND SENTINEL LYMPH NODE BIOPSY;  Surgeon: Excell Seltzer, MD;  Location: [facility];  Service: General;  Laterality: Right;  . CERVICAL SPINE SURGERY  2009  . COLONOSCOPY    . ENDOMETRIAL ABLATION    . POLYPECTOMY    . TUBAL LIGATION    . tube ligation        ALLERGIES:  Allergies  Allergen Reactions  . Compazine [Prochlorperazine Edisylate]     Muscle contractures      CURRENT MEDICATIONS:  Outpatient Encounter Medications as of 07/09/2019  Medication Sig  . anastrozole (ARIMIDEX) 1 MG tablet TAKE 1 TABLET BY MOUTH EVERY DAY  . clonazePAM (KLONOPIN) 0.5 MG tablet Take 0.5 mg by mouth 2 (two) times daily as needed for anxiety.  . meloxicam (MOBIC) 15 MG tablet Take 15 mg by mouth daily.  . Vitamin D, Ergocalciferol, (DRISDOL) 1.25 MG (50000 UT) CAPS capsule Take 1 capsule (50,000 Units total) by mouth every 7 (seven) days.   No facility-administered encounter medications on file as of 07/09/2019.      ONCOLOGIC FAMILY HISTORY:  Family History  Problem Relation Age of Onset  . Breast cancer Mother 39  . Diabetes Mother   . Hypertension Mother   . Hyperlipidemia Mother   . Obesity Mother   . Diabetes Father   . Hypertension Father   .  Hyperlipidemia Father   . Ovarian cancer Maternal Grandmother   . Colon cancer Neg Hx   . Esophageal cancer Neg Hx   . Rectal cancer Neg Hx   . Stomach cancer Neg Hx      GENETIC COUNSELING/TESTING: Yes, per Dr. Matthew Saras in 2019 - negative   SOCIAL HISTORY:  Social History   Socioeconomic History  . Marital status: Married    Spouse name: Laquanda Bick   . Number of children: Not on file  . Years of education: Not on file  . Highest education level: Not on file  Occupational History  . Occupation: English as a second language teacher  Social Needs  . Financial resource strain: Not on file  . Food insecurity    Worry: Not on file    Inability: Not on file  . Transportation needs    Medical: Not on file    Non-medical: Not on file  Tobacco Use  . Smoking status:  Never Smoker  . Smokeless tobacco: Never Used  Substance and Sexual Activity  . Alcohol use: Yes    Alcohol/week: 1.0 - 2.0 standard drinks    Types: 1 - 2 Glasses of wine per week  . Drug use: Never  . Sexual activity: Not on file  Lifestyle  . Physical activity    Days per week: Not on file    Minutes per session: Not on file  . Stress: Not on file  Relationships  . Social Herbalist on phone: Not on file    Gets together: Not on file    Attends religious service: Not on file    Active member of club or organization: Not on file    Attends meetings of clubs or organizations: Not on file    Relationship status: Not on file  . Intimate partner violence    Fear of current or ex partner: Not on file    Emotionally abused: Not on file    Physically abused: Not on file    Forced sexual activity: Not on file  Other Topics Concern  . Not on file  Social History Narrative   Lives with husband and daughter     OBSERVATIONS/OBJECTIVE:  No vitals for today's visit. Patient appears well over the phone. Speech, mood and affect appear normal. Respirations are even and unlabored. Skin color appears normal.   LABORATORY DATA:  None for this visit.  DIAGNOSTIC IMAGING:  None for this visit.      ASSESSMENT AND PLAN:  Ms.. Hayes is a pleasant 58 y.o. female with Stage IA right breast invasive ductal carcinoma, ER+/PR+/HER2-, diagnosed in 10/2018, treated with lumpectomy, adjuvant radiation therapy, and anti-estrogen therapy with Anastrozole beginning in 03/2019.  She presents to the Survivorship Clinic for our initial meeting and routine follow-up post-completion of treatment for breast cancer.   1. Stage IA right breast cancer:  Heather Hayes is continuing to recover from definitive treatment for breast cancer. She will follow-up with her medical oncologist, Dr. Burr Medico in 10/2019 with history and physical exam per surveillance protocol. As of now the visit is scheduled to be  virtual due to the covid19 global pandemic. She will continue her anti-estrogen therapy with Anastrozole. Thus far, she is tolerating well with mild hot flashes and joint pain which she has at baseline in hip and shoulders due to arthritis/bursitis. She otherwise has minimal side effects. She was instructed to make Dr. Burr Medico or myself aware if she begins to experience any worsening side effects of the  medication and I could see her back in clinic to help manage those side effects, as needed. Her mammogram is due in 09/2018; orders placed today.  Her breast density is category C. Today, a comprehensive survivorship care plan and treatment summary was reviewed with the patient today detailing her breast cancer diagnosis, treatment course, potential late/long-term effects of treatment, appropriate follow-up care with recommendations for the future, and patient education resources.  A copy of this summary, along with a letter will be sent to the patient's primary care provider via [employer] message after today's visit.    2. Bone health:  Given Ms. Karner's age/history of breast cancer and her current treatment regimen including anti-estrogen therapy with anastrozole, she is at risk for bone demineralization.  Her last DEXA scan was 09/2017 which was normal. In the meantime, she was encouraged to increase her consumption of foods rich in calcium, as well as increase her weight-bearing activities.  She was given education on specific activities to promote bone health. She is on vitamin D 50,000 IU weekly per her provider at weight management. I encouraged her to add calcium supplement.   3. Cancer screening:  Due to Ms. Scatena's history and her age, she should receive screening for skin cancers, colon cancer, and gynecologic cancers.  Last colonoscopy in 05/2019, she is on 5 year recall. She is also followed by GYN Dr. Matthew Saras. The information and recommendations are listed on the patient's comprehensive care  plan/treatment summary and were reviewed in detail with the patient.    4. Health maintenance and wellness promotion: Ms. Murdoch was encouraged to consume 5-7 servings of fruits and vegetables per day. We reviewed the "Nutrition Rainbow" handout, as well as the handout "Take Control of Your Health and Reduce Your Cancer Risk" from the [facility].  She was also encouraged to engage in moderate to vigorous exercise for 30 minutes per day most days of the week. We discussed the [employer] fitness program, which is designed for cancer survivors to help them become more physically fit after cancer treatments.  She was instructed to limit her alcohol consumption and continue to abstain from tobacco use.     5. Support services/counseling: It is not uncommon for this period of the patient's cancer care trajectory to be one of many emotions and stressors.  We discussed how this can be increasingly difficult during the times of quarantine and social distancing due to the COVID-19 pandemic.   She was given information regarding our available services and encouraged to contact me with any questions or for help enrolling in any of our support group/programs.   Orders Placed This Encounter  Procedures  . MM DIAG BREAST TOMO BILATERAL    Standing Status:   Future    Standing Expiration Date:   07/08/2020    Order Specific Question:   Reason for Exam (SYMPTOM  OR DIAGNOSIS REQUIRED)    Answer:   h/o right breast cancer s/p lumpectomy and adjuvant RT. on AI. diagnosed 10/2018    Order Specific Question:   Is the patient pregnant?    Answer:   No    Order Specific Question:   Preferred imaging location?    [facility]:   [facility]   Follow up instructions:    -F/u with Dr. Burr Medico in 10/2019, as of now the visit is scheduled to be virtual due to the [place] crisis, the nature of the visit will be re-evaluated closer to the scheduled date to determine  whether it should be changed to  in-person visit.  -Mammogram due in 09/2019 -Follow up with surgery as clinically indicated -She is welcome to return back to the Survivorship Clinic at any time; no additional follow-up needed at this time.  -Consider referral back to survivorship as a long-term survivor for continued surveillance The patient was provided an opportunity to ask questions and all were answered. The patient agreed with the plan and demonstrated an understanding of the instructions.   The patient was advised to call back or seek an in-person evaluation if she develops any new or worsening symptoms in the interim.   I provided 30 minutes of video-assisted face-to-face time during this encounter.   Alla Feeling, NP  07/09/2019

## 2019-07-10 ENCOUNTER — Ambulatory Visit (INDEPENDENT_AMBULATORY_CARE_PROVIDER_SITE_OTHER): Payer: 59 | Admitting: Psychology

## 2019-07-10 ENCOUNTER — Ambulatory Visit (INDEPENDENT_AMBULATORY_CARE_PROVIDER_SITE_OTHER): Payer: 59 | Admitting: Family Medicine

## 2019-07-10 ENCOUNTER — Encounter (INDEPENDENT_AMBULATORY_CARE_PROVIDER_SITE_OTHER): Payer: Self-pay | Admitting: Family Medicine

## 2019-07-10 ENCOUNTER — Other Ambulatory Visit: Payer: Self-pay

## 2019-07-10 ENCOUNTER — Telehealth: Payer: Self-pay | Admitting: Nurse Practitioner

## 2019-07-10 VITALS — BP 121/75 | HR 71 | Temp 98.1°F | Ht 63.0 in | Wt 178.0 lb

## 2019-07-10 DIAGNOSIS — Z9189 Other specified personal risk factors, not elsewhere classified: Secondary | ICD-10-CM | POA: Diagnosis not present

## 2019-07-10 DIAGNOSIS — E669 Obesity, unspecified: Secondary | ICD-10-CM

## 2019-07-10 DIAGNOSIS — F418 Other specified anxiety disorders: Secondary | ICD-10-CM | POA: Diagnosis not present

## 2019-07-10 DIAGNOSIS — Z6831 Body mass index (BMI) 31.0-31.9, adult: Secondary | ICD-10-CM

## 2019-07-10 DIAGNOSIS — E7849 Other hyperlipidemia: Secondary | ICD-10-CM

## 2019-07-10 DIAGNOSIS — E559 Vitamin D deficiency, unspecified: Secondary | ICD-10-CM

## 2019-07-10 DIAGNOSIS — E8881 Metabolic syndrome: Secondary | ICD-10-CM

## 2019-07-10 MED ORDER — VITAMIN D (ERGOCALCIFEROL) 1.25 MG (50000 UNIT) PO CAPS: 50000.0000 [IU] | ORAL_CAPSULE | ORAL | 0 refills | Status: DC

## 2019-07-10 NOTE — Telephone Encounter (Signed)
No los per 1/27. 

## 2019-07-11 ENCOUNTER — Encounter (INDEPENDENT_AMBULATORY_CARE_PROVIDER_SITE_OTHER): Payer: Self-pay | Admitting: Family Medicine

## 2019-07-11 ENCOUNTER — Other Ambulatory Visit (INDEPENDENT_AMBULATORY_CARE_PROVIDER_SITE_OTHER): Payer: Self-pay | Admitting: Family Medicine

## 2019-07-11 DIAGNOSIS — E559 Vitamin D deficiency, unspecified: Secondary | ICD-10-CM

## 2019-07-11 NOTE — Progress Notes (Signed)
Office: [phone]  /  Fax: [phone]   HPI:   Chief Complaint: OBESITY Heather Hayes is here to discuss her progress with her obesity treatment plan. She is on the Category 2 plan with breakfast options and is following her eating plan approximately 80 % of the time. She states she is on the treadmill and doing yard work for 30-40 minutes 2-3 times per week. Heather Hayes is doing well and she asks about BMI goal. Her personal weight goal is 155-165 lbs, size 12. She has celebration eating but adhered to the guidelines.  Her weight is 178 lb (80.7 kg) today and has had a weight loss of 2 pounds over a period of 2 weeks since her last visit. She has lost 13 lbs since starting treatment with [name].  Insulin Resistance Heather Hayes has a diagnosis of insulin resistance based on her elevated fasting insulin level >5. Although Heather Hayes's blood glucose readings are still under good control, insulin resistance puts her at greater risk of metabolic syndrome and diabetes. She is not taking metformin currently and continues to work on diet and exercise to decrease risk of diabetes.  At risk for diabetes Heather Hayes is at higher than average risk for developing diabetes due to her obesity and insulin resistance.   Vitamin D Deficiency Heather Hayes has a diagnosis of vitamin D deficiency. She is currently taking prescription Vit D and denies nausea, vomiting or muscle weakness.  Hyperlipidemia Heather Hayes has hyperlipidemia and has been trying to improve her cholesterol levels with intensive lifestyle modification including a low saturated fat diet, exercise and weight loss. She denies any chest pain, claudication or myalgias.  ASSESSMENT AND PLAN:  Insulin resistance  Vitamin D deficiency - Plan: Vitamin D, Ergocalciferol, (DRISDOL) 1.25 MG (50000 UT) CAPS capsule  Other hyperlipidemia  At risk for diabetes mellitus  Class 1 obesity with serious comorbidity and body mass index (BMI) of 31.0 to 31.9 in adult, unspecified obesity  type  PLAN:  Insulin Resistance Heather Hayes will continue to work on weight loss, exercise, and decreasing simple carbohydrates to help decrease the risk of diabetes. Heather Hayes agrees to follow up with [name] as directed to closely monitor her progress.  Diabetes risk counseling (~15 min) Heather Hayes is a 58 y.o. female and has risk factors for diabetes including obesity and insulin resistance. We discussed intensive lifestyle modifications today with an emphasis on weight loss as well as increasing exercise and decreasing simple carbohydrates in her diet.  Vitamin D Deficiency Low vitamin D level contributes to fatigue and are associated with obesity, breast, and colon cancer. Heather Hayes agrees to continue taking prescription Vit D 50,000 IU every week #4 and we will refill for 1 month. She will follow up for routine testing of vitamin D, at least 2-3 times per year to avoid over-replacement. Heather Hayes agrees to follow up with our clinic in 2 weeks.  Hyperlipidemia Intensive lifestyle modifications as the first line treatment for hyperlipidemia. We discussed many lifestyle modifications today and Heather Hayes will continue to work on diet, exercise and weight loss efforts. We will continue to monitor.  Obesity Phyillis is currently in the action stage of change. As such, her goal is to continue with weight loss efforts She has agreed to follow the Category 2 plan with breakfast options [name] has been instructed to work up to a goal of 150 minutes of combined cardio and strengthening exercise per week for weight loss and overall health benefits. We discussed the following Behavioral Modification Strategies today: increasing lean protein intake, decreasing simple  carbohydrates, increasing vegetables, increase H20 intake, work on meal planning and easy cooking plans, holiday eating strategies  and decrease liquid calories   Heather Hayes has agreed to follow up with our clinic in 2 weeks. She was informed of the importance of frequent follow up  visits to maximize her success with intensive lifestyle modifications for her multiple health conditions.  ALLERGIES: Allergies  Allergen Reactions  . Compazine [Prochlorperazine Edisylate]     Muscle contractures     MEDICATIONS: Current Outpatient Medications on File Prior to Visit  Medication Sig Dispense Refill  . anastrozole (ARIMIDEX) 1 MG tablet TAKE 1 TABLET BY MOUTH EVERY DAY 90 tablet 3  . clonazePAM (KLONOPIN) 0.5 MG tablet Take 0.5 mg by mouth 2 (two) times daily as needed for anxiety.    . meloxicam (MOBIC) 15 MG tablet Take 15 mg by mouth daily.     No current facility-administered medications on file prior to visit.     PAST MEDICAL HISTORY: Past Medical History:  Diagnosis Date  . Anxiety   . Arthritis   . Back pain   . Bursitis   . Cancer [facility])    right breast cancer 09-2018  . Constipation   . GERD (gastroesophageal reflux disease)    some heart burn from Arimidex  . History of radiation therapy    completed 01-29-2019  . Hyperlipidemia   . Joint pain   . Rheumatic fever   . Swallowing difficulty     PAST SURGICAL HISTORY: Past Surgical History:  Procedure Laterality Date  . BREAST LUMPECTOMY WITH RADIOACTIVE SEED AND SENTINEL LYMPH NODE BIOPSY Right 10/27/2018   Procedure: RIGHT BREAST LUMPECTOMY WITH RADIOACTIVE SEED AND SENTINEL LYMPH NODE BIOPSY;  Surgeon: Excell Seltzer, MD;  Location: [facility];  Service: General;  Laterality: Right;  . CERVICAL SPINE SURGERY  2009  . COLONOSCOPY    . ENDOMETRIAL ABLATION    . POLYPECTOMY    . TUBAL LIGATION    . tube ligation       SOCIAL HISTORY: Social History   Tobacco Use  . Smoking status: Never Smoker  . Smokeless tobacco: Never Used  Substance Use Topics  . Alcohol use: Yes    Alcohol/week: 1.0 - 2.0 standard drinks    Types: 1 - 2 Glasses of wine per week  . Drug use: Never    FAMILY HISTORY: Family History  Problem Relation Age of Onset  . Breast cancer Mother 32   . Diabetes Mother   . Hypertension Mother   . Hyperlipidemia Mother   . Obesity Mother   . Diabetes Father   . Hypertension Father   . Hyperlipidemia Father   . Ovarian cancer Maternal Grandmother   . Colon cancer Neg Hx   . Esophageal cancer Neg Hx   . Rectal cancer Neg Hx   . Stomach cancer Neg Hx     ROS: Review of Systems  Constitutional: Positive for weight loss.  Cardiovascular: Negative for chest pain and claudication.  Gastrointestinal: Negative for nausea and vomiting.  Musculoskeletal: Negative for myalgias.       Negative muscle weakness    PHYSICAL EXAM: Blood pressure 121/75, pulse 71, temperature 98.1 F (36.7 C), temperature source Oral, height 5\' 3"  (1.6 m), weight 178 lb (80.7 kg), SpO2 99 %. Body mass index is 31.53 kg/m. Physical Exam Vitals signs reviewed.  Constitutional:      Appearance: Normal appearance. She is obese.  Cardiovascular:     Rate and Rhythm: Normal rate.  Pulses: Normal pulses.  Pulmonary:     Effort: Pulmonary effort is normal.     Breath sounds: Normal breath sounds.  Musculoskeletal: Normal range of motion.  Skin:    General: Skin is warm and dry.  Neurological:     Mental Status: She is alert and oriented to person, place, and time.  Psychiatric:        Mood and Affect: Mood normal.        Behavior: Behavior normal.     RECENT LABS AND TESTS: BMET    Component Value Date/Time   NA 139 05/15/2019 0850   K 4.5 05/15/2019 0850   CL 100 05/15/2019 0850   CO2 23 05/15/2019 0850   GLUCOSE 98 05/15/2019 0850   GLUCOSE 88 02/26/2019 1036   BUN 12 05/15/2019 0850   CREATININE 0.64 05/15/2019 0850   CREATININE 0.75 02/26/2019 1036   CALCIUM 9.8 05/15/2019 0850   GFRNONAA 99 05/15/2019 0850   GFRNONAA >60 02/26/2019 1036   GFRAA 114 05/15/2019 0850   GFRAA >60 02/26/2019 1036   Lab Results  Component Value Date   HGBA1C 5.4 05/15/2019   Lab Results  Component Value Date   INSULIN 14.9 05/15/2019   CBC     Component Value Date/Time   WBC 5.4 05/15/2019 0850   WBC 4.9 02/26/2019 1036   WBC 6.3 09/06/2007 1520   RBC 4.40 05/15/2019 0850   RBC 4.33 02/26/2019 1036   HGB 13.3 05/15/2019 0850   HCT 41.0 05/15/2019 0850   PLT 257 05/15/2019 0850   MCV 93 05/15/2019 0850   MCH 30.2 05/15/2019 0850   MCH 30.5 02/26/2019 1036   MCHC 32.4 05/15/2019 0850   MCHC 32.4 02/26/2019 1036   RDW 12.7 05/15/2019 0850   LYMPHSABS 1.2 05/15/2019 0850   MONOABS 0.4 02/26/2019 1036   EOSABS 0.1 05/15/2019 0850   BASOSABS 0.1 05/15/2019 0850   Iron/TIBC/Ferritin/ %Sat No results found for: IRON, TIBC, FERRITIN, IRONPCTSAT Lipid Panel     Component Value Date/Time   CHOL 226 (H) 05/15/2019 0850   TRIG 251 (H) 05/15/2019 0850   HDL 70 05/15/2019 0850   LDLCALC 113 (H) 05/15/2019 0850   Hepatic Function Panel     Component Value Date/Time   PROT 7.5 05/15/2019 0850   ALBUMIN 4.5 05/15/2019 0850   AST 15 05/15/2019 0850   AST 16 02/26/2019 1036   ALT 11 05/15/2019 0850   ALT 14 02/26/2019 1036   ALKPHOS 102 05/15/2019 0850   BILITOT 0.4 05/15/2019 0850   BILITOT 0.3 02/26/2019 1036      Component Value Date/Time   TSH 1.560 05/15/2019 0850      OBESITY BEHAVIORAL INTERVENTION VISIT  Today's visit was # 5   Starting weight: 191 lbs Starting date: 05/15/2019 Today's weight : 178 lbs Today's date: 07/10/2019 Total lbs lost to date: 30    ASK: We discussed the diagnosis of obesity with Sinda Du today and Alajia agreed to give [name] permission to discuss obesity behavioral modification therapy today.  ASSESS: Savanha has the diagnosis of obesity and her BMI today is 31.54 Krystyne is in the action stage of change   ADVISE: Jazzmine was educated on the multiple health risks of obesity as well as the benefit of weight loss to improve her health. She was advised of the need for long term treatment and the importance of lifestyle modifications to improve her current health and to decrease her  risk of future health problems.  AGREE:  Multiple dietary modification options and treatment options were discussed and  Ayoka agreed to follow the recommendations documented in the above note.  ARRANGE: Bethenny was educated on the importance of frequent visits to treat obesity as outlined per CMS and USPSTF guidelines and agreed to schedule her next follow up appointment today.  Wilhemena Durie, am acting as transcriptionist for Briscoe Deutscher, DO  I have reviewed the above documentation for accuracy and completeness, and I agree with the above. Briscoe Deutscher, DO

## 2019-07-24 ENCOUNTER — Ambulatory Visit (INDEPENDENT_AMBULATORY_CARE_PROVIDER_SITE_OTHER): Payer: 59 | Admitting: Family Medicine

## 2019-07-24 ENCOUNTER — Encounter (INDEPENDENT_AMBULATORY_CARE_PROVIDER_SITE_OTHER): Payer: Self-pay | Admitting: Family Medicine

## 2019-07-24 ENCOUNTER — Other Ambulatory Visit: Payer: Self-pay

## 2019-07-24 VITALS — BP 124/85 | HR 65 | Temp 98.1°F | Ht 63.0 in | Wt 174.0 lb

## 2019-07-24 DIAGNOSIS — E559 Vitamin D deficiency, unspecified: Secondary | ICD-10-CM | POA: Diagnosis not present

## 2019-07-24 DIAGNOSIS — E669 Obesity, unspecified: Secondary | ICD-10-CM

## 2019-07-24 DIAGNOSIS — Z9189 Other specified personal risk factors, not elsewhere classified: Secondary | ICD-10-CM | POA: Diagnosis not present

## 2019-07-24 DIAGNOSIS — E8881 Metabolic syndrome: Secondary | ICD-10-CM

## 2019-07-24 DIAGNOSIS — E7849 Other hyperlipidemia: Secondary | ICD-10-CM | POA: Diagnosis not present

## 2019-07-24 DIAGNOSIS — Z6830 Body mass index (BMI) 30.0-30.9, adult: Secondary | ICD-10-CM

## 2019-07-24 MED ORDER — VITAMIN D (ERGOCALCIFEROL) 1.25 MG (50000 UNIT) PO CAPS: 50000.0000 [IU] | ORAL_CAPSULE | ORAL | 0 refills | Status: DC

## 2019-07-30 ENCOUNTER — Encounter: Payer: Self-pay | Admitting: *Deleted

## 2019-07-31 NOTE — Progress Notes (Signed)
Chief Complaint: [place] is here to discuss her progress with her obesity treatment plan. Heather Hayes is on the Category 2 plan with breakfast options and states she is following her eating plan approximately 85 % of the time. Heather Hayes states she is walking on the treadmill, and starting incline for 30-35 minutes 3 times per week.  Today's visit was # 6  Starting weight: 191 lbs Starting date: 05/15/2019 Today's weight : 174 lbs Today's date: 07/24/2019 Total lbs lost to date: 17 Total lbs lost since last in-office visit: 4  Subjective:   Interim History: Heather Hayes stuck to the meal plan well. She used celebration meal stratgies.  1. Insulin resistance Heather Hayes denies polyphagia. Last insulin level was 14.9 on 05/05/2019.  2. Vitamin D deficiency Heather Hayes is currently taking prescription Vit D.  3. Mixed hyperlipidemia Heather Hayes is not on medications.  Lab Results  Component Value Date   CHOL 226 (H) 05/15/2019   HDL 70 05/15/2019   LDLCALC 113 (H) 05/15/2019   TRIG 251 (H) 05/15/2019   Lab Results  Component Value Date   ALT 11 05/15/2019   AST 15 05/15/2019   ALKPHOS 102 05/15/2019   BILITOT 0.4 05/15/2019   The 10-year ASCVD risk score Heather Bussing DC Jr., Heather al., Heather Hayes) is: 2.3%   Values used to calculate the score:     Age: 58 years     Sex: Female     Is Non-Hispanic African American: No     Diabetic: No     Tobacco smoker: No     Systolic Blood Pressure: [id number] mmHg     Is BP treated: No     HDL Cholesterol: 70 mg/dL     Total Cholesterol: 226 mg/dL  4. At risk for diabetes Heather Hayes is at higher than average risk for developing diabetes due to her obesity.   Reviewed and updated this visit by clinician and staff: allergies, medications, problem list, medical history, surgical history, family history, social history and previous encounter notes.  General review of systems is unchanged or negative, with the exception of new weight loss.  Assessment:   1.  Insulin resistance   2. Vitamin D deficiency   3. Other hyperlipidemia   4. At risk for diabetes mellitus   5. Class 1 obesity with serious comorbidity and body mass index (BMI) of 30.0 to 30.9 in adult, unspecified obesity type    Plan:   1. Insulin resistance Mirabel will continue to work on weight loss, exercise, and decreasing simple carbohydrates to help decrease the risk of diabetes. Heather Hayes agrees to follow-up with [name] as directed to closely monitor her progress.  2. Vitamin D deficiency Low Vitamin D level contributes to fatigue and are associated with obesity, breast, and colon cancer. Heather Hayes agrees to continue taking prescription Vitamin D 50,000 IU every week #4 and we will refill for 1 month. She will follow-up for routine testing of vitamin D, at least 2-3 times per year to avoid over-replacement. Heather Hayes agrees to follow up with [name] as directed to monitor her progress.  3. Other hyperlipidemia Intensive lifestyle modifications as the first line treatment for hyperlipidemia. We discussed many lifestyle modifications today and Heather Hayes will continue to work on diet, exercise and weight loss efforts. We will continue to monitor.  4. Diabetes risk counseling (~15 min) Heather Hayes is a 58 y.o. female and has risk factors for diabetes including obesity. We discussed intensive lifestyle modifications today with an emphasis on  weight loss as well as increasing exercise and decreasing simple carbohydrates in her diet.  5. Class 1 obesity with serious comorbidity and body mass index (BMI) of 30.0 to 30.9 in adult, unspecified obesity type Heather Hayes is currently in the action stage of change. As such, her goal is to continue with weight loss efforts. She has agreed to follow the Category 2 plan. The exercise goal is to eventually work up to 150 minutes of combined cardio and strengthening exercise per week for weight loss and overall health benefits. We discussed the following Behavioral Modification Strategies today:  increasing lean protein intake, decreasing simple carbohydrates , increasing vegetables, increasing water intake and celebration eating strategies.  Heather Hayes has agreed to follow-up with our clinic in 3 weeks. She was informed of the importance of frequent follow-up visits to maximize her success with intensive lifestyle modifications for her multiple health conditions.  Objective:   Blood pressure 124/85, pulse 65, temperature 98.1 F (36.7 C), temperature source Oral, height 5\' 3"  (1.6 m), weight 174 lb (78.9 kg), SpO2 99 %. Body mass index is 30.82 kg/m.  General: Cooperative, alert, well developed, in no acute distress. HEENT: Conjunctivae and lids unremarkable. Neck: No thyromegaly.  Cardiovascular: Regular rhythm.  Lungs: Normal work of breathing. Extremities: No edema.  Neurologic: No focal deficits.      Component Value Date/Time   WBC 5.4 05/15/2019 0850   WBC 4.9 02/26/2019 1036   WBC 6.3 09/06/2007 1520   RBC 4.40 05/15/2019 0850   RBC 4.33 02/26/2019 1036   HGB 13.3 05/15/2019 0850   HCT 41.0 05/15/2019 0850   PLT 257 05/15/2019 0850   MCV 93 05/15/2019 0850   MCH 30.2 05/15/2019 0850   MCH 30.5 02/26/2019 1036   MCHC 32.4 05/15/2019 0850   MCHC 32.4 02/26/2019 1036   RDW 12.7 05/15/2019 0850   LYMPHSABS 1.2 05/15/2019 0850   MONOABS 0.4 02/26/2019 1036   EOSABS 0.1 05/15/2019 0850   BASOSABS 0.1 05/15/2019 0850   Lab Results  Component Value Date   ALT 11 05/15/2019   AST 15 05/15/2019   ALKPHOS 102 05/15/2019   BILITOT 0.4 05/15/2019   Lab Results  Component Value Date   CREATININE 0.64 05/15/2019   BUN 12 05/15/2019   NA 139 05/15/2019   K 4.5 05/15/2019   CL 100 05/15/2019   CO2 23 05/15/2019   Lab Results  Component Value Date   HGBA1C 5.4 05/15/2019   Lab Results  Component Value Date   INSULIN 14.9 05/15/2019   Lab Results  Component Value Date   HGBA1C 5.4 05/15/2019   Lab Results  Component Value Date   TSH 1.560  05/15/2019   Lab Results  Component Value Date   CHOL 226 (H) 05/15/2019   HDL 70 05/15/2019   LDLCALC 113 (H) 05/15/2019   TRIG 251 (H) 05/15/2019   No results found for: IRON, TIBC, FERRITIN   Attestation Statements:   Wilhemena Durie, am acting as transcriptionist for [employer], DO.

## 2019-08-06 NOTE — Progress Notes (Signed)
  Office: [phone]  /  Fax: [phone]    Date: August 09, 2019   Appointment Start Time: 8:02am Duration: 29 minutes Provider: Glennie Isle, Psy.D. Type of Session: Individual Therapy  Location of Patient: Work Biomedical scientist of Provider: [facility] Type of Contact: Telepsychological Visit via [employer]  Session Content: Heather Hayes is a 59 y.o. female presenting via [place] for a follow-up appointment to address the previously established treatment goal of decreasing emotional eating. Today's appointment was a telepsychological visit due to COVID-19. Heather Hayes provided verbal consent for today's telepsychological appointment and she is aware she is responsible for securing confidentiality on her end of the session. Prior to proceeding with today's appointment, Heather Hayes's physical location at the time of this appointment was obtained as well a phone number she could be reached at in the event of technical difficulties. Heather Hayes and this provider participated in today's telepsychological service.   This provider conducted a brief check-in. Heather Hayes shared, "It's been a busy time." She discussed deviations from the structured meal plan during the holidays, but described making better choices and engaging in portion control. Positive reinforcement was provided. She also reported a reduction in emotional eating. Session then focused on mindfulness. Heather Hayes discussed reviewing the mindfulness handout shared. Psychoeducation regarding formal (e.g., setting aside a specific time daily to engage in an exercise) and informal (e.g., cultivating awareness in the present moment and taking a non-judgmental approach while engaging in day-to-day tasks) mindfulness was provided. This provider discussed the utilization of YouTube for mindfulness exercises (e.g., videos by Merri Ray) to assist with formal practice. Termination planning was also discussed. Heather Hayes was receptive to a follow-up appointment in 3-4  weeks and an additional follow-up/termination appointment in 3-4 weeks after that. Heather Hayes was receptive to today's appointment as evidenced by openness to sharing, responsiveness to feedback, and willingness to engage in learned skills.  Mental Status Examination:  Appearance: well groomed and appropriate hygiene  Behavior: appropriate to circumstances Mood: euthymic Affect: mood congruent Speech: normal in rate, volume, and tone Eye Contact: appropriate Psychomotor Activity: appropriate Gait: unable to assess Thought Process: linear, logical, and goal directed  Thought Content/Perception: no hallucinations, delusions, bizarre thinking or behavior reported or observed and no evidence of suicidal and homicidal ideation, plan, and intent Orientation: time, person, place and purpose of appointment Memory/Concentration: memory, attention, language, and fund of knowledge intact  Insight/Judgment: good  Interventions:  Conducted a brief chart review Provided empathic reflections and validation Reviewed content from the previous session Employed supportive psychotherapy interventions to facilitate reduced distress and to improve coping skills with identified stressors Employed motivational interviewing skills to assess patient's willingness/desire to adhere to recommended medical treatments and assignments Psychoeducation provided regarding mindfulness  Discussed termination planning   DSM-5 Diagnosis: 300.09 (F41.8) Other Specified Anxiety Disorder, Emotional Eating Behaviors  Treatment Goal & Progress: During the initial appointment with this provider, the following treatment goal was established: decrease emotional eating. Heather Hayes has demonstrated progress in her goal as evidenced by increased awareness of hunger patterns, increased awareness of triggers for emotional eating and reduction in emotional eating. Heather Hayes also continues to demonstrate willingness to engage in learned skill(s).  Plan:  The next appointment will be scheduled in three weeks, which will be via [employer]. The next session will focus on working towards the established treatment goal.

## 2019-08-09 ENCOUNTER — Ambulatory Visit (INDEPENDENT_AMBULATORY_CARE_PROVIDER_SITE_OTHER): Payer: BC Managed Care – PPO | Admitting: Psychology

## 2019-08-09 ENCOUNTER — Other Ambulatory Visit: Payer: Self-pay

## 2019-08-09 DIAGNOSIS — F418 Other specified anxiety disorders: Secondary | ICD-10-CM

## 2019-08-15 NOTE — Progress Notes (Signed)
Office: [phone]  /  Fax: [phone]    Date: August 29, 2019   Appointment Start Time: 8:00am Duration: 25 minutes Provider: Glennie Isle, Psy.D. Type of Session: Individual Therapy  Location of Patient: Work Biomedical scientist of Provider: [facility] Type of Contact: Telepsychological Visit via [employer]  Session Content: Heather Hayes is a 59 y.o. female presenting via [place] for a follow-up appointment to address the previously established treatment goal of decreasing emotional eating. Today's appointment was a telepsychological visit due to COVID-19. Heather Hayes provided verbal consent for today's telepsychological appointment and she is aware she is responsible for securing confidentiality on her end of the session. Prior to proceeding with today's appointment, Heather Hayes's physical location at the time of this appointment was obtained as well a phone number she could be reached at in the event of technical difficulties. Heather Hayes and this provider participated in today's telepsychological service.   This provider conducted a brief check-in. Heather Hayes shared she feels she is doing "well" as it relates to her eating habits. She continues to report a reduction in emotional eating. Of note, Heather Hayes reported feeling as though she has "plataued" with weight loss; therefore, it was recommended she discuss this further with Dr. Juleen China during their appointment next week. She agreed. In addition, Heather Hayes shared about engaging in mindfulness exercises. Positive reinforcement was provided. Heather Hayes also discussed mindfulness helping her re-focus and improve her mood.  Moreover, Heather Hayes shared she often experiences decreased mood during winter, noting January and February are "dull months." Thus, psychoeducation regarding the connection between thoughts, feelings, and behaviors was provided using different examples. Furthermore, psychoeducation regarding pleasurable activities, including its impact on emotional eating and  overall well-being was provided. Heather Hayes was provided with a handout with various options of pleasurable activities, and was encouraged to engage in one activity a day and additional activities as needed when triggered to emotionally eat. Heather Hayes agreed. Heather Hayes provided verbal consent during today's appointment for this provider to send a handout with pleasurable activities via e-mail. Overall, Heather Hayes was receptive to today's appointment as evidenced by openness to sharing, responsiveness to feedback, and willingness to engage in pleasurable activities to assist with coping.  Mental Status Examination:  Appearance: well groomed and appropriate hygiene  Behavior: appropriate to circumstances Mood: euthymic Affect: mood congruent Speech: normal in rate, volume, and tone Eye Contact: appropriate Psychomotor Activity: appropriate Gait: unable to assess Thought Process: linear, logical, and goal directed  Thought Content/Perception: no hallucinations, delusions, bizarre thinking or behavior reported or observed and no evidence of suicidal and homicidal ideation, plan, and intent Orientation: time, person, place and purpose of appointment Memory/Concentration: memory, attention, language, and fund of knowledge intact  Insight/Judgment: good  Interventions:  Conducted a brief chart review Provided empathic reflections and validation Reviewed content from the previous session Provided positive reinforcement Employed supportive psychotherapy interventions to facilitate reduced distress and to improve coping skills with identified stressors Employed motivational interviewing skills to assess patient's willingness/desire to adhere to recommended medical treatments and assignments Psychoeducation provided regarding the connection between thoughts, feelings, and behaviors Psychoeducation provided regarding pleasurable activities  DSM-5 Diagnosis: 300.09 (F41.8) Other Specified Anxiety Disorder, Emotional Eating  Behaviors  Treatment Goal & Progress: During the initial appointment with this provider, the following treatment goal was established: decrease emotional eating. Heather Hayes has demonstrated progress in her goal as evidenced by increased awareness of hunger patterns, increased awareness of triggers for emotional eating and reduction in emotional eating. Heather Hayes also continues to demonstrate willingness to engage  in learned skill(s).  Plan: The next appointment will be scheduled in one month, which will be via [employer]. The next session will focus on reviewing learned skills and termination.

## 2019-08-16 ENCOUNTER — Ambulatory Visit (INDEPENDENT_AMBULATORY_CARE_PROVIDER_SITE_OTHER): Payer: BC Managed Care – PPO | Admitting: Family Medicine

## 2019-08-16 ENCOUNTER — Encounter (INDEPENDENT_AMBULATORY_CARE_PROVIDER_SITE_OTHER): Payer: Self-pay | Admitting: Family Medicine

## 2019-08-16 ENCOUNTER — Other Ambulatory Visit: Payer: Self-pay

## 2019-08-16 VITALS — BP 100/67 | HR 80 | Temp 98.0°F | Ht 63.0 in | Wt 173.0 lb

## 2019-08-16 DIAGNOSIS — Z6830 Body mass index (BMI) 30.0-30.9, adult: Secondary | ICD-10-CM | POA: Diagnosis not present

## 2019-08-16 DIAGNOSIS — E559 Vitamin D deficiency, unspecified: Secondary | ICD-10-CM | POA: Diagnosis not present

## 2019-08-16 DIAGNOSIS — E669 Obesity, unspecified: Secondary | ICD-10-CM | POA: Diagnosis not present

## 2019-08-16 DIAGNOSIS — Z9189 Other specified personal risk factors, not elsewhere classified: Secondary | ICD-10-CM

## 2019-08-16 MED ORDER — VITAMIN D (ERGOCALCIFEROL) 1.25 MG (50000 UNIT) PO CAPS: 50000.0000 [IU] | ORAL_CAPSULE | ORAL | 0 refills | Status: DC

## 2019-08-16 NOTE — Progress Notes (Signed)
Chief Complaint:   OBESITY STEPHENIE Hayes is here to discuss her progress with her obesity treatment plan along with follow-up of her obesity related diagnoses. [name] is on the Category 2 Plan and states Heather Hayes is following her eating plan approximately 80% of the time. Zylphia states Heather Hayes is walking on the treadmill 30 to 35 minutes 3 times per week.  Today's visit was #: 7 Starting weight: 191 lbs Starting date: 05/15/19 Today's weight: 173 lbs Today's date: 08/16/2019 Total lbs lost to date: 18 Total lbs lost since last in-office visit: 1  Interim History: Heather Hayes continues to do well with weight loss even over the holidays. Heather Hayes is doing well following her plan and has increased exercise. Energy is good and her hunger is controlled.  Subjective:   1. Vitamin D deficiency Melondy's Vitamin D level was 19.4 on 05/15/19. Heather Hayes is currently taking vit D and is stable, but not yet at goal. Heather Hayes is due for labs and Heather Hayes requests a vitamin D refill.  2. At risk for dehydration Heather Hayes is at higher risk of dehydration with her continued weight loss and history of decreased water intake.  Assessment/Plan:   1. Vitamin D deficiency Low Vitamin D level contributes to fatigue and are associated with obesity, breast, and colon cancer. Heather Hayes agrees to continue to take prescription Vitamin D @50 ,000 IU every week #4 with no refills and will follow-up for routine testing of vitamin D, at least 2-3 times per year to avoid over-replacement. We will order labs at her next visit.  - Vitamin D, Ergocalciferol, (DRISDOL) 1.25 MG (50000 UNIT) CAPS capsule; Take 1 capsule (50,000 Units total) by mouth every 7 (seven) days.  Dispense: 4 capsule; Refill: 0  2. At risk for dehydration Nashea was given approximately 15 minutes dehydration prevention counseling today. [name] is at risk for dehydration due to weight loss and current medication(s). Heather Hayes was encouraged to hydrate and monitor fluid status to avoid dehydration as well  as weight loss plateaus.   3. Class 1 obesity with serious comorbidity and body mass index (BMI) of 30.0 to 30.9 in adult, unspecified obesity type  Heather Hayes is currently in the action stage of change. As such, her goal is to continue with weight loss efforts. Heather Hayes has agreed to on the Category 2 Plan.   We discussed the following exercise goals today: Continue as is. We discussed the following behavioral modification strategies today: increasing water intake and meal planning and cooking strategies.  Heather Hayes has agreed to follow-up with our clinic in 2 to 3 weeks. Heather Hayes was informed of the importance of frequent follow-up visits to maximize her success with intensive lifestyle modifications for her multiple health conditions.   Heather Hayes was informed we would discuss her lab results at her next visit unless there is a critical issue that needs to be addressed sooner. Heather Hayes agreed to keep her next visit at the agreed upon time to discuss these results.  Objective:   Blood pressure 100/67, pulse 80, temperature 98 F (36.7 C), temperature source Oral, height 5\' 3"  (1.6 m), weight 173 lb (78.5 kg), SpO2 98 %. Body mass index is 30.65 kg/m.  General: Cooperative, alert, well developed, in no acute distress. HEENT: Conjunctivae and lids unremarkable. Neck: No thyromegaly.  Cardiovascular: Regular rhythm.  Lungs: Normal work of breathing. Extremities: No edema.  Neurologic: No focal deficits.   Lab Results  Component Value Date   CREATININE 0.64 05/15/2019   BUN 12 05/15/2019  NA 139 05/15/2019   K 4.5 05/15/2019   CL 100 05/15/2019   CO2 23 05/15/2019   Lab Results  Component Value Date   ALT 11 05/15/2019   AST 15 05/15/2019   ALKPHOS 102 05/15/2019   BILITOT 0.4 05/15/2019   Lab Results  Component Value Date   HGBA1C 5.4 05/15/2019   Lab Results  Component Value Date   INSULIN 14.9 05/15/2019   Lab Results  Component Value Date   TSH 1.560 05/15/2019   Lab Results    Component Value Date   CHOL 226 (H) 05/15/2019   HDL 70 05/15/2019   LDLCALC 113 (H) 05/15/2019   TRIG 251 (H) 05/15/2019   Lab Results  Component Value Date   WBC 5.4 05/15/2019   HGB 13.3 05/15/2019   HCT 41.0 05/15/2019   MCV 93 05/15/2019   PLT 257 05/15/2019   No results found for: IRON, TIBC, FERRITIN   Attestation Statements:   Reviewed by clinician on day of visit: allergies, medications, problem list, medical history, surgical history, family history, social history, and previous encounter notes.  IMarcille Blanco, CMA, am acting as transcriptionist for Starlyn Skeans, MD  I have reviewed the above documentation for accuracy and completeness, and I agree with the above. -  Dennard Nip, MD

## 2019-08-29 ENCOUNTER — Other Ambulatory Visit: Payer: Self-pay

## 2019-08-29 ENCOUNTER — Ambulatory Visit (INDEPENDENT_AMBULATORY_CARE_PROVIDER_SITE_OTHER): Payer: BC Managed Care – PPO | Admitting: Psychology

## 2019-08-29 DIAGNOSIS — F418 Other specified anxiety disorders: Secondary | ICD-10-CM | POA: Diagnosis not present

## 2019-09-05 ENCOUNTER — Encounter (INDEPENDENT_AMBULATORY_CARE_PROVIDER_SITE_OTHER): Payer: Self-pay | Admitting: Family Medicine

## 2019-09-05 ENCOUNTER — Other Ambulatory Visit: Payer: Self-pay

## 2019-09-05 ENCOUNTER — Ambulatory Visit (INDEPENDENT_AMBULATORY_CARE_PROVIDER_SITE_OTHER): Payer: BC Managed Care – PPO | Admitting: Family Medicine

## 2019-09-05 VITALS — BP 130/85 | HR 61 | Temp 97.6°F | Ht 63.0 in | Wt 171.0 lb

## 2019-09-05 DIAGNOSIS — Z6830 Body mass index (BMI) 30.0-30.9, adult: Secondary | ICD-10-CM

## 2019-09-05 DIAGNOSIS — E8881 Metabolic syndrome: Secondary | ICD-10-CM

## 2019-09-05 DIAGNOSIS — E559 Vitamin D deficiency, unspecified: Secondary | ICD-10-CM

## 2019-09-05 DIAGNOSIS — Z9189 Other specified personal risk factors, not elsewhere classified: Secondary | ICD-10-CM

## 2019-09-05 DIAGNOSIS — F419 Anxiety disorder, unspecified: Secondary | ICD-10-CM

## 2019-09-05 DIAGNOSIS — E669 Obesity, unspecified: Secondary | ICD-10-CM

## 2019-09-05 MED ORDER — VITAMIN D (ERGOCALCIFEROL) 1.25 MG (50000 UNIT) PO CAPS: 50000.0000 [IU] | ORAL_CAPSULE | ORAL | 0 refills | Status: DC

## 2019-09-05 NOTE — Progress Notes (Signed)
Chief Complaint:   OBESITY Heather Hayes is here to discuss her progress with her obesity treatment plan along with follow-up of her obesity related diagnoses. Heather Hayes is on the Category 2 Plan and states she is following her eating plan approximately 75% of the time. Heather Hayes states she is walking for 30 minutes 3 times per week.  Today's visit was #: 8 Starting weight: 191 lbs Starting date: 05/15/2019 Today's weight: 171 lbs Today's date: 09/05/2019 Total lbs lost to date: 20 lbs Total lbs lost since last in-office visit: 2 lbs  Interim History: Heather Hayes is happy with her weight loss.  She says she is ready to start strength training.  Subjective:   1. Insulin resistance Heather Hayes has a diagnosis of insulin resistance based on her elevated fasting insulin level >5. She continues to work on diet and exercise to decrease her risk of diabetes.  Lab Results  Component Value Date   INSULIN 14.9 05/15/2019   Lab Results  Component Value Date   HGBA1C 5.4 05/15/2019   2. Vitamin D deficiency Heather Hayes Vitamin D level was 19.4 on 05/15/2019. She is currently taking vit D. She denies nausea, vomiting or muscle weakness.  3. Anxiety, with emotinal eating Heather Hayes has anxiety with emotional eating.  She sees Dr. Mallie Mussel.  4. At risk for heart disease [name] is at a higher than average risk for cardiovascular disease due to obesity. Reviewed: no chest pain on exertion, no dyspnea on exertion, and no swelling of ankles.  Assessment/Plan:   1. Insulin resistance Heather Hayes will continue to work on weight loss, exercise, and decreasing simple carbohydrates to help decrease the risk of diabetes. Heather Hayes agreed to follow-up with [name] as directed to closely monitor her progress.  Orders - Comprehensive metabolic panel - Hemoglobin A1c - Insulin, random  2. Vitamin D deficiency Low Vitamin D level contributes to fatigue and are associated with obesity, breast, and colon cancer. She agrees to continue to take prescription  Vitamin D @50 ,000 IU every week and will follow-up for routine testing of Vitamin D, at least 2-3 times per year to avoid over-replacement.  Orders - VITAMIN D 25 Hydroxy (Vit-D Deficiency, Fractures) - Vitamin B12 - Vitamin D, Ergocalciferol, (DRISDOL) 1.25 MG (50000 UNIT) CAPS capsule; Take 1 capsule (50,000 Units total) by mouth every 7 (seven) days.  Dispense: 4 capsule; Refill: 0  3. Anxiety, with emotinal eating Behavior modification techniques were discussed today to help Heather Hayes deal with her anxiety.  Orders and follow up as documented in patient record.   4. At risk for heart disease Heather Hayes was given approximately 15 minutes of coronary artery disease prevention counseling today. She is 59 y.o. female and has risk factors for heart disease including obesity. We discussed intensive lifestyle modifications today with an emphasis on specific weight loss instructions and strategies.   Repetitive spaced learning was employed today to elicit superior memory formation and behavioral change.  Orders - CBC with Differential/Platelet - Lipid panel  5. Class 1 obesity with serious comorbidity and body mass index (BMI) of 30.0 to 30.9 in adult, unspecified obesity type Heather Hayes is currently in the action stage of change. As such, her goal is to continue with weight loss efforts. She has agreed to the Category 2 Plan.   Exercise goals: For substantial health benefits, adults should do at least 150 minutes (2 hours and 30 minutes) a week of moderate-intensity, or 75 minutes (1 hour and 15 minutes) a week of vigorous-intensity aerobic physical activity, or an  equivalent combination of moderate- and vigorous-intensity aerobic activity. Aerobic activity should be performed in episodes of at least 10 minutes, and preferably, it should be spread throughout the week. Adults should also include muscle-strengthening activities that involve all major muscle groups on 2 or more days a week.  Behavioral modification  strategies: planning for success.  Heather Hayes has agreed to follow-up with our clinic in 2 weeks. She was informed of the importance of frequent follow-up visits to maximize her success with intensive lifestyle modifications for her multiple health conditions.   Heather Hayes was informed we would discuss her lab results at her next visit unless there is a critical issue that needs to be addressed sooner. Heather Hayes agreed to keep her next visit at the agreed upon time to discuss these results.  Objective:   Blood pressure 130/85, pulse 61, temperature 97.6 F (36.4 C), temperature source Oral, height 5\' 3"  (1.6 m), weight 171 lb (77.6 kg), SpO2 99 %. Body mass index is 30.29 kg/m.  General: Cooperative, alert, well developed, in no acute distress. HEENT: Conjunctivae and lids unremarkable. Cardiovascular: Regular rhythm.  Lungs: Normal work of breathing. Neurologic: No focal deficits.   Lab Results  Component Value Date   CREATININE 0.64 05/15/2019   BUN 12 05/15/2019   NA 139 05/15/2019   K 4.5 05/15/2019   CL 100 05/15/2019   CO2 23 05/15/2019   Lab Results  Component Value Date   ALT 11 05/15/2019   AST 15 05/15/2019   ALKPHOS 102 05/15/2019   BILITOT 0.4 05/15/2019   Lab Results  Component Value Date   HGBA1C 5.4 05/15/2019   Lab Results  Component Value Date   INSULIN 14.9 05/15/2019   Lab Results  Component Value Date   TSH 1.560 05/15/2019   Lab Results  Component Value Date   CHOL 226 (H) 05/15/2019   HDL 70 05/15/2019   LDLCALC 113 (H) 05/15/2019   TRIG 251 (H) 05/15/2019   Lab Results  Component Value Date   WBC 5.4 05/15/2019   HGB 13.3 05/15/2019   HCT 41.0 05/15/2019   MCV 93 05/15/2019   PLT 257 05/15/2019   Attestation Statements:   Reviewed by clinician on day of visit: allergies, medications, problem list, medical history, surgical history, family history, social history, and previous encounter notes.  I, Water quality scientist, CMA, am acting as Location manager  for [employer], DO.  I have reviewed the above documentation for accuracy and completeness, and I agree with the above. Briscoe Deutscher, DO

## 2019-09-06 LAB — LIPID PANEL
Chol/HDL Ratio: 3.1 ratio (ref 0.0–4.4)
Cholesterol, Total: 200 mg/dL — ABNORMAL HIGH (ref 100–199)
HDL: 65 mg/dL (ref >39)
LDL Chol Calc (NIH): 110 mg/dL — ABNORMAL HIGH (ref 0–99)
Triglycerides: 146 mg/dL (ref 0–149)
VLDL Cholesterol Cal: 25 mg/dL (ref 5–40)

## 2019-09-06 LAB — CBC WITH DIFFERENTIAL/PLATELET
Basophils Absolute: 0.1 10*3/uL (ref 0.0–0.2)
Basos: 1 %
EOS (ABSOLUTE): 0.1 10*3/uL (ref 0.0–0.4)
Eos: 2 %
Hematocrit: 41.1 % (ref 34.0–46.6)
Hemoglobin: 13.6 g/dL (ref 11.1–15.9)
Immature Grans (Abs): 0 10*3/uL (ref 0.0–0.1)
Immature Granulocytes: 0 %
Lymphocytes Absolute: 1.4 10*3/uL (ref 0.7–3.1)
Lymphs: 26 %
MCH: 30.6 pg (ref 26.6–33.0)
MCHC: 33.1 g/dL (ref 31.5–35.7)
MCV: 92 fL (ref 79–97)
Monocytes Absolute: 0.3 10*3/uL (ref 0.1–0.9)
Monocytes: 6 %
Neutrophils Absolute: 3.6 10*3/uL (ref 1.4–7.0)
Neutrophils: 65 %
Platelets: 235 10*3/uL (ref 150–450)
RBC: 4.45 x10E6/uL (ref 3.77–5.28)
RDW: 12.4 % (ref 11.7–15.4)
WBC: 5.4 10*3/uL (ref 3.4–10.8)

## 2019-09-06 LAB — COMPREHENSIVE METABOLIC PANEL
ALT: 16 IU/L (ref 0–32)
AST: 19 IU/L (ref 0–40)
Albumin/Globulin Ratio: 1.8 (ref 1.2–2.2)
Albumin: 4.6 g/dL (ref 3.8–4.9)
Alkaline Phosphatase: 90 IU/L (ref 39–117)
BUN/Creatinine Ratio: 11 (ref 9–23)
BUN: 8 mg/dL (ref 6–24)
Bilirubin Total: 0.3 mg/dL (ref 0.0–1.2)
CO2: 24 mmol/L (ref 20–29)
Calcium: 10 mg/dL (ref 8.7–10.2)
Chloride: 103 mmol/L (ref 96–106)
Creatinine, Ser: 0.74 mg/dL (ref 0.57–1.00)
GFR calc Af Amer: 103 mL/min/{1.73_m2} (ref >59)
GFR calc non Af Amer: 90 mL/min/{1.73_m2} (ref >59)
Globulin, Total: 2.6 g/dL (ref 1.5–4.5)
Glucose: 78 mg/dL (ref 65–99)
Potassium: 4.5 mmol/L (ref 3.5–5.2)
Sodium: 142 mmol/L (ref 134–144)
Total Protein: 7.2 g/dL (ref 6.0–8.5)

## 2019-09-06 LAB — VITAMIN D 25 HYDROXY (VIT D DEFICIENCY, FRACTURES): Vit D, 25-Hydroxy: 43.7 ng/mL (ref 30.0–100.0)

## 2019-09-06 LAB — HEMOGLOBIN A1C
Est. average glucose Bld gHb Est-mCnc: 111 mg/dL
Hgb A1c MFr Bld: 5.5 % (ref 4.8–5.6)

## 2019-09-06 LAB — VITAMIN B12: Vitamin B-12: 382 pg/mL (ref 232–1245)

## 2019-09-06 LAB — INSULIN, RANDOM: INSULIN: 8.3 u[IU]/mL (ref 2.6–24.9)

## 2019-09-12 NOTE — Progress Notes (Signed)
Office: [phone]  /  Fax: [phone]    Date: September 26, 2019   Appointment Start Time: 7:59am Duration: 30 minutes Provider: Glennie Isle, Psy.D. Type of Session: Individual Therapy  Location of Patient: Work Biomedical scientist of Provider: Provider's Home Type of Contact: Telepsychological Visit via [employer]  Session Content: Heather Hayes is a 59 y.o. female presenting via [place] for a follow-up appointment to address the previously established treatment goal of decreasing emotional eating. Today's appointment was a telepsychological visit due to COVID-19. Heather Hayes provided verbal consent for today's telepsychological appointment and she is aware she is responsible for securing confidentiality on her end of the session. Prior to proceeding with today's appointment, Heather Hayes's physical location at the time of this appointment was obtained as well a phone number she could be reached at in the event of technical difficulties. Heather Hayes and this provider participated in today's telepsychological service.   This provider conducted a brief check-in and verbally administered the PHQ-9 and GAD-7. Heather Hayes shared about recent events, including celebrations and spending time with her family. She shared she continues to lose weight, adding her labs have improved. Progress to date was discussed and reflected. She reported increased awareness of eating habits, mindfulness, and support from the clinic and family has helped her in her journey, adding "It's been a great journey." A plan was developed to help Heather Hayes cope with emotional eating in the future using learned skills. She wrote down the following plan: focus on hydration, be prepared with snacks congruent to the meal plan, pause to ask questions when triggered to eat (e.g., Am I really hungry?; Is there something bothering me? Will I feel better if I eat?), and engage in pleasurable activities and/or other coping skills after going through the aforementioned questions. She  noted, "This is great." Overall, Heather Hayes was receptive to today's appointment as evidenced by openness to sharing, responsiveness to feedback, and willingness to continue engaging in learned skills.  Mental Status Examination:  Appearance: well groomed and appropriate hygiene  Behavior: appropriate to circumstances Mood: euthymic Affect: mood congruent Speech: normal in rate, volume, and tone Eye Contact: appropriate Psychomotor Activity: appropriate Gait: unable to assess Thought Process: linear, logical, and goal directed  Thought Content/Perception: no hallucinations, delusions, bizarre thinking or behavior reported or observed and no evidence of suicidal and homicidal ideation, plan, and intent Orientation: time, person, place and purpose of appointment Memory/Concentration: memory, attention, language, and fund of knowledge intact  Insight/Judgment: good  Structured Assessments Results: The Patient Health Questionnaire-9 (PHQ-9) is a self-report measure that assesses symptoms and severity of depression over the course of the last two weeks. Heather Hayes obtained a score of 0. [0= Not at all; 1= Several days; 2= More than half the days; 3= Nearly every day] Little interest or pleasure in doing things 0  Feeling down, depressed, or hopeless 0  Trouble falling or staying asleep, or sleeping too much 0  Feeling tired or having little energy 0  Poor appetite or overeating 0  Feeling bad about yourself --- or that you are a failure or have let yourself or your family down 0  Trouble concentrating on things, such as reading the newspaper or watching television 0  Moving or speaking so slowly that other people could have noticed? Or the opposite --- being so fidgety or restless that you have been moving around a lot more than usual 0  Thoughts that you would be better off dead or hurting yourself in some way 0  PHQ-9 Score 0  The Generalized Anxiety Disorder-7 (GAD-7) is a brief self-report measure  that assesses symptoms of anxiety over the course of the last two weeks. Heather Hayes obtained a score of 0. [0= Not at all; 1= Several days; 2= Over half the days; 3= Nearly every day] Feeling nervous, anxious, on edge 0  Not being able to stop or control worrying 0  Worrying too much about different things 0  Trouble relaxing 0  Being so restless that it's hard to sit still 0  Becoming easily annoyed or irritable 0  Feeling afraid as if something awful might happen 0  GAD-7 Score 0   Interventions:  Conducted a brief chart review Verbally administered PHQ-9 and GAD-7 for symptom monitoring Provided empathic reflections and validation Provided positive reinforcement Employed supportive psychotherapy interventions to facilitate reduced distress and to improve coping skills with identified stressors Employed motivational interviewing skills to assess patient's willingness/desire to adhere to recommended medical treatments and assignments Reviewed learned skills  DSM-5 Diagnosis: 300.09 (F41.8) Other Specified Anxiety Disorder, Emotional Eating Behaviors  Treatment Goal & Progress: During the initial appointment with this provider, the following treatment goal was established: decrease emotional eating. Heather Hayes demonstrated progress in her goal as evidenced by increased awareness of hunger patterns, increased awareness of triggers for emotional eating and reduction in emotional eating. Heather Hayes also continues to demonstrate willingness to engage in learned skill(s).  Plan: As previously planned, today was Heather Hayes's last appointment with this provider. She acknowledged understanding that she may request a follow-up appointment with this provider in the future as long as she is still established with the clinic. No further follow-up planned by this provider.

## 2019-09-13 DIAGNOSIS — M7061 Trochanteric bursitis, right hip: Secondary | ICD-10-CM | POA: Diagnosis not present

## 2019-09-13 DIAGNOSIS — Z0000 Encounter for general adult medical examination without abnormal findings: Secondary | ICD-10-CM | POA: Diagnosis not present

## 2019-09-13 DIAGNOSIS — F419 Anxiety disorder, unspecified: Secondary | ICD-10-CM | POA: Diagnosis not present

## 2019-09-13 DIAGNOSIS — E7800 Pure hypercholesterolemia, unspecified: Secondary | ICD-10-CM | POA: Diagnosis not present

## 2019-09-19 ENCOUNTER — Other Ambulatory Visit: Payer: Self-pay

## 2019-09-19 ENCOUNTER — Encounter (INDEPENDENT_AMBULATORY_CARE_PROVIDER_SITE_OTHER): Payer: Self-pay | Admitting: Physician Assistant

## 2019-09-19 ENCOUNTER — Ambulatory Visit (INDEPENDENT_AMBULATORY_CARE_PROVIDER_SITE_OTHER): Payer: BC Managed Care – PPO | Admitting: Physician Assistant

## 2019-09-19 VITALS — BP 125/78 | HR 59 | Temp 98.4°F | Ht 63.0 in | Wt 168.0 lb

## 2019-09-19 DIAGNOSIS — E559 Vitamin D deficiency, unspecified: Secondary | ICD-10-CM

## 2019-09-19 DIAGNOSIS — E66811 Obesity, class 1: Secondary | ICD-10-CM

## 2019-09-19 DIAGNOSIS — Z6830 Body mass index (BMI) 30.0-30.9, adult: Secondary | ICD-10-CM

## 2019-09-19 DIAGNOSIS — E669 Obesity, unspecified: Secondary | ICD-10-CM | POA: Diagnosis not present

## 2019-09-19 NOTE — Progress Notes (Signed)
Chief Complaint:   OBESITY Heather Hayes is here to discuss her progress with her obesity treatment plan along with follow-up of her obesity related diagnoses. Heather Hayes is on the Category 2 Plan and states she is following her eating plan approximately 75% of the time. Heather Hayes states she is using the treadmill for 30-35 minutes 3 times per week.  Today's visit was #: 9 Starting weight: 191 lbs Starting date: 05/15/2019 Today's weight: 168 lbs Today's date: 09/19/2019 Total lbs lost to date: 23 lbs Total lbs lost since last in-office visit: 3 lbs  Interim History: Heather Hayes has done very well with the plan.  She had a few celebratory events recently and did a great job either portion controlling treats or not eating them.  She wants to start resistance training.  Subjective:   1. Vitamin D deficiency Heather Hayes's Vitamin D level was 43.7 on 09/05/2019. She is currently taking vit D. She denies nausea, vomiting or muscle weakness.  Assessment/Plan:   1. Vitamin D deficiency Heather Hayes will finish the rest of her current prescription and then once finished, begin 5000 IU daily.  2. Class 1 obesity with serious comorbidity and body mass index (BMI) of 30.0 to 30.9 in adult, unspecified obesity type Heather Hayes is currently in the action stage of change. As such, her goal is to continue with weight loss efforts. She has agreed to the Category 2 Plan.   Exercise goals: As is along with resistance bands 2 times a week.  Behavioral modification strategies: celebration eating strategies and planning for success.  Heather Hayes has agreed to follow-up with our clinic in 2 weeks. She was informed of the importance of frequent follow-up visits to maximize her success with intensive lifestyle modifications for her multiple health conditions.   Objective:   Blood pressure 125/78, pulse (!) 59, temperature 98.4 F (36.9 C), temperature source Oral, height 5\' 3"  (1.6 m), weight 168 lb (76.2 kg), SpO2 100 %. Body mass index is 29.76  kg/m.  General: Cooperative, alert, well developed, in no acute distress. HEENT: Conjunctivae and lids unremarkable. Cardiovascular: Regular rhythm.  Lungs: Normal work of breathing. Neurologic: No focal deficits.   Lab Results  Component Value Date   CREATININE 0.74 09/05/2019   BUN 8 09/05/2019   NA 142 09/05/2019   K 4.5 09/05/2019   CL 103 09/05/2019   CO2 24 09/05/2019   Lab Results  Component Value Date   ALT 16 09/05/2019   AST 19 09/05/2019   ALKPHOS 90 09/05/2019   BILITOT 0.3 09/05/2019   Lab Results  Component Value Date   HGBA1C 5.5 09/05/2019   HGBA1C 5.4 05/15/2019   Lab Results  Component Value Date   INSULIN 8.3 09/05/2019   INSULIN 14.9 05/15/2019   Lab Results  Component Value Date   TSH 1.560 05/15/2019   Lab Results  Component Value Date   CHOL 200 (H) 09/05/2019   HDL 65 09/05/2019   LDLCALC 110 (H) 09/05/2019   TRIG 146 09/05/2019   CHOLHDL 3.1 09/05/2019   Lab Results  Component Value Date   WBC 5.4 09/05/2019   HGB 13.6 09/05/2019   HCT 41.1 09/05/2019   MCV 92 09/05/2019   PLT 235 09/05/2019   Attestation Statements:   Reviewed by clinician on day of visit: allergies, medications, problem list, medical history, surgical history, family history, social history, and previous encounter notes.  Time spent on visit including pre-visit chart review and post-visit care was 32 minutes.   I, Water quality scientist,  CMA, am acting as transcriptionist for [employer], PA-C.  I have reviewed the above documentation for accuracy and completeness, and I agree with the above. Abby Potash, PA-C

## 2019-09-26 ENCOUNTER — Other Ambulatory Visit: Payer: Self-pay

## 2019-09-26 ENCOUNTER — Ambulatory Visit (INDEPENDENT_AMBULATORY_CARE_PROVIDER_SITE_OTHER): Payer: BC Managed Care – PPO | Admitting: Psychology

## 2019-09-26 DIAGNOSIS — F5089 Other specified eating disorder: Secondary | ICD-10-CM

## 2019-09-26 DIAGNOSIS — F418 Other specified anxiety disorders: Secondary | ICD-10-CM

## 2019-09-27 DIAGNOSIS — Z01419 Encounter for gynecological examination (general) (routine) without abnormal findings: Secondary | ICD-10-CM | POA: Diagnosis not present

## 2019-09-27 DIAGNOSIS — Z6830 Body mass index (BMI) 30.0-30.9, adult: Secondary | ICD-10-CM | POA: Diagnosis not present

## 2019-10-01 ENCOUNTER — Other Ambulatory Visit: Payer: Self-pay

## 2019-10-01 ENCOUNTER — Ambulatory Visit
Admission: RE | Admit: 2019-10-01 | Discharge: 2019-10-01 | Disposition: A | Discharge status: Home / Self Care | Payer: BC Managed Care – PPO | Source: Ambulatory Visit | Attending: Nurse Practitioner | Admitting: Nurse Practitioner

## 2019-10-01 DIAGNOSIS — C50411 Malignant neoplasm of upper-outer quadrant of right female breast: Secondary | ICD-10-CM

## 2019-10-01 DIAGNOSIS — Z170 Estrogen receptor positive status [ER+]: Secondary | ICD-10-CM

## 2019-10-01 DIAGNOSIS — Z853 Personal history of malignant neoplasm of breast: Secondary | ICD-10-CM | POA: Diagnosis not present

## 2019-10-01 DIAGNOSIS — R922 Inconclusive mammogram: Secondary | ICD-10-CM | POA: Diagnosis not present

## 2019-10-01 HISTORY — DX: Personal history of irradiation: Z92.3

## 2019-10-04 ENCOUNTER — Other Ambulatory Visit: Payer: Self-pay

## 2019-10-04 ENCOUNTER — Encounter (INDEPENDENT_AMBULATORY_CARE_PROVIDER_SITE_OTHER): Payer: Self-pay | Admitting: Bariatrics

## 2019-10-04 ENCOUNTER — Ambulatory Visit (INDEPENDENT_AMBULATORY_CARE_PROVIDER_SITE_OTHER): Payer: BC Managed Care – PPO | Admitting: Bariatrics

## 2019-10-04 VITALS — BP 114/76 | HR 64 | Temp 98.4°F | Ht 63.0 in | Wt 168.0 lb

## 2019-10-04 DIAGNOSIS — Z6830 Body mass index (BMI) 30.0-30.9, adult: Secondary | ICD-10-CM | POA: Diagnosis not present

## 2019-10-04 DIAGNOSIS — E559 Vitamin D deficiency, unspecified: Secondary | ICD-10-CM | POA: Diagnosis not present

## 2019-10-04 DIAGNOSIS — F3289 Other specified depressive episodes: Secondary | ICD-10-CM

## 2019-10-04 DIAGNOSIS — E669 Obesity, unspecified: Secondary | ICD-10-CM | POA: Diagnosis not present

## 2019-10-04 NOTE — Progress Notes (Signed)
Chief Complaint:   OBESITY Heather Hayes is here to discuss her progress with her obesity treatment plan along with follow-up of her obesity related diagnoses. Heather Hayes is on the Category 2 Plan and states she is following her eating plan approximately 75% of the time. Heather Hayes states she is walking on the treadmill 30-35 minutes 2 times per week.  Today's visit was #: 10 Starting weight: 191 lbs Starting date: 05/15/2019 Today's weight: 168 lbs Today's date: 10/04/2019 Total lbs lost to date: 23 Total lbs lost since last in-office visit: 0  Interim History: Heather Hayes's weight remains the same at this visit. She has hit a plateau and this is the first time that she has not lost weight. Her goal weight is 150-155 lbs. She reports doing well with her water intake.  Subjective:   Other depression, with emotional eating. Heather Hayes is struggling with emotional eating and using food for comfort to the extent that it is negatively impacting her health. She has been working on behavior modification techniques to help reduce her emotional eating and has been somewhat successful. She shows no sign of suicidal or homicidal ideations. Heather Hayes reports slight emotional eating.  Vitamin D deficiency. Heather Hayes is taking Vitamin D. Last Vitamin D 43.7 on 09/05/2019.  Assessment/Plan:   Other depression, with emotional eating. Behavior modification techniques were discussed today to help Heather Hayes deal with her emotional/non-hunger eating behaviors.  Orders and follow up as documented in patient record. We discussed CBT techniques.  Vitamin D deficiency. Low Vitamin D level contributes to fatigue and are associated with obesity, breast, and colon cancer. She agrees to continue to take Vitamin D and will follow-up for routine testing of Vitamin D, at least 2-3 times per year to avoid over-replacement.  Class 1 obesity with serious comorbidity and body mass index (BMI) of 30.0 to 30.9 in adult, unspecified obesity type - BMI  greater than 30 at start of program.   Heather Hayes is currently in the action stage of change. As such, her goal is to continue with weight loss efforts. She has agreed to the [employer].   She will work on meal planning, mindful eating, and increasing her protein and water.  Exercise goals: All adults should avoid inactivity. Some physical activity is better than none, and adults who participate in any amount of physical activity gain some health benefits.  Behavioral modification strategies: increasing lean protein intake, decreasing simple carbohydrates, increasing vegetables, increasing water intake, decreasing eating out, no skipping meals, meal planning and cooking strategies, keeping healthy foods in the home and planning for success.  Heather Hayes has agreed to follow-up with our clinic in 2 weeks. She was informed of the importance of frequent follow-up visits to maximize her success with intensive lifestyle modifications for her multiple health conditions.   Objective:   Blood pressure 114/76, pulse 64, temperature 98.4 F (36.9 C), height 5\' 3"  (1.6 m), weight 168 lb (76.2 kg), SpO2 99 %. Body mass index is 29.76 kg/m.  General: Cooperative, alert, well developed, in no acute distress. HEENT: Conjunctivae and lids unremarkable. Cardiovascular: Regular rhythm.  Lungs: Normal work of breathing. Neurologic: No focal deficits.   Lab Results  Component Value Date   CREATININE 0.74 09/05/2019   BUN 8 09/05/2019   NA 142 09/05/2019   K 4.5 09/05/2019   CL 103 09/05/2019   CO2 24 09/05/2019   Lab Results  Component Value Date   ALT 16 09/05/2019   AST 19 09/05/2019   ALKPHOS  90 09/05/2019   BILITOT 0.3 09/05/2019   Lab Results  Component Value Date   HGBA1C 5.5 09/05/2019   HGBA1C 5.4 05/15/2019   Lab Results  Component Value Date   INSULIN 8.3 09/05/2019   INSULIN 14.9 05/15/2019   Lab Results  Component Value Date   TSH 1.560 05/15/2019   Lab Results  Component  Value Date   CHOL 200 (H) 09/05/2019   HDL 65 09/05/2019   LDLCALC 110 (H) 09/05/2019   TRIG 146 09/05/2019   CHOLHDL 3.1 09/05/2019   Lab Results  Component Value Date   WBC 5.4 09/05/2019   HGB 13.6 09/05/2019   HCT 41.1 09/05/2019   MCV 92 09/05/2019   PLT 235 09/05/2019   No results found for: IRON, TIBC, FERRITIN  Attestation Statements:   Reviewed by clinician on day of visit: allergies, medications, problem list, medical history, surgical history, family history, social history, and previous encounter notes.  Time spent on visit including pre-visit chart review and post-visit charting and care was 20 minutes.   Migdalia Dk, am acting as Location manager for [employer], DO   I have reviewed the above documentation for accuracy and completeness, and I agree with the above. Jearld Lesch, DO

## 2019-10-17 ENCOUNTER — Encounter: Payer: Self-pay | Admitting: Hematology

## 2019-10-18 NOTE — Progress Notes (Signed)
[place]   Telephone:([phone] Fax:([phone]) [phone]   Clinic Follow up Note   Patient Care Team: Molli Posey, MD as PCP - General (Obstetrics and Gynecology) Mauro Kaufmann, RN as Oncology Nurse Navigator Rockwell Germany, RN as Oncology Nurse Navigator Excell Seltzer, MD (Inactive) as Consulting Physician (General Surgery) Truitt Merle, MD as Consulting Physician (Hematology) Gery Pray, MD as Consulting Physician (Radiation Oncology) Alla Feeling, NP as Nurse Practitioner (Nurse Practitioner)   Date of Service:  10/24/2019   CHIEF COMPLAINT: F/u of right breast cancer  SUMMARY OF ONCOLOGIC HISTORY: Oncology History Overview Note  Cancer Staging Malignant neoplasm of upper-outer quadrant of right breast in female, estrogen receptor positive ([place]) Staging form: Breast, AJCC 8th Edition - Clinical stage from 10/02/2018: Stage IA (cT1b, cN0, cM0, G2, ER+, PR+, HER2-) - Signed by Truitt Merle, MD on 10/10/2018 - Pathologic stage from 10/27/2018: Stage IA (pT1b, pN0, cM0, G1, ER+, PR+, HER2-) - Signed by Truitt Merle, MD on 02/26/2019     Malignant neoplasm of upper-outer quadrant of right breast in female, estrogen receptor positive ([place])  09/27/2018 Mammogram   [facility] Mammogram 09/27/18  IMPRESSION: 1. Suspicious right breast mass measures 7 x 7 x 6 mm with associated vascularity at the 12 o'clock position 7 cm from the nipple. 2. No suspicious right axillary lymphadenopathy.   10/02/2018 Cancer Staging   Staging form: Breast, AJCC 8th Edition - Clinical stage from 10/02/2018: Stage IA (cT1b, cN0, cM0, G2, ER+, PR+, HER2-) - Signed by Truitt Merle, MD on 10/10/2018   10/02/2018 Initial Biopsy   Diagnosis 10/02/18 Breast, right, needle core biopsy, 12 o'clock - INVASIVE DUCTAL CARCINOMA.   10/02/2018 Receptors her2   Results: IMMUNOHISTOCHEMICAL AND MORPHOMETRIC ANALYSIS PERFORMED MANUALLY The tumor cells are NEGATIVE for Her2 (1+). Estrogen Receptor: 95%,  POSITIVE, STRONG STAINING INTENSITY Progesterone Receptor: 95%, POSITIVE, STRONG STAINING INTENSITY Proliferation Marker Ki67: 5%   10/04/2018 Initial Diagnosis   Malignant neoplasm of upper-outer quadrant of right breast in female, estrogen receptor positive ([place])   10/27/2018 Surgery   RIGHT BREAST LUMPECTOMY WITH RADIOACTIVE SEED AND SENTINEL LYMPH NODE BIOPSY by Dr Excell Seltzer   10/27/2018 Pathology Results   Diagnosis 10/27/18 1. Breast, lumpectomy, Right - INVASIVE DUCTAL CARCINOMA WITH CALCIFICATIONS, GRADE I/III, SPANNING 0.7 CM. - THE SURGICAL RESECTION MARGIN ARE NEGATIVE FOR CARCINOMA. - SEE ONCOLOGY TABLE BELOW. 2. Lymph node, sentinel, biopsy, Right - THERE IS NO EVIDENCE OF CARCINOMA IN 1 OF 1 LYMPH NODE (0/1). 3. Lymph node, sentinel, biopsy, Right - THERE IS NO EVIDENCE OF CARCINOMA IN 1 OF 1 LYMPH NODE (0/1).    - 01/29/2019 Radiation Therapy   Radiation therapy at [facility] on 01/29/19.    10/27/2018 Cancer Staging   Staging form: Breast, AJCC 8th Edition - Pathologic stage from 10/27/2018: Stage IA (pT1b, pN0, cM0, G1, ER+, PR+, HER2-) - Signed by Truitt Merle, MD on 02/26/2019   03/2019 -  Anti-estrogen oral therapy   Anastrozole 27m daily starting in 03/2019    07/09/2019 Survivorship   SCP delivered by LCira Rue NP       CURRENT THERAPY:  Anastrozole 140mdaily starting 03/2019  INTERVAL HISTORY:  Heather KENEALYs here for a follow up of right breast cancer. She was last seen by me 5 months ago and seen by NP Heather Hayes 3 months ago in interim. She presents to the clinic alone. She notes she is doing well. She is on Anastrozole and tolerating well. She has arthritis and  bursitis in her hip which has been mildly exacerbated by anastrozole due to joint stiffness. She notes she was given Meloxicam and Volteran from her Ortho. She notes having mild hot flashes which are tolerable. She notes she has been working to lose weight with [facility] weight and management  clinic. She has lost 23 pounds with them. This has improved her BG and cholesterol.    REVIEW OF SYSTEMS:   Constitutional: Denies fevers, chills (+) Purposeful weight loss (+) Hot flashes  Eyes: Denies blurriness of vision Ears, nose, mouth, throat, and face: Denies mucositis or sore throat Respiratory: Denies cough, dyspnea or wheezes Cardiovascular: Denies palpitation, chest discomfort or lower extremity swelling Gastrointestinal:  Denies nausea, heartburn or change in bowel habits Skin: Denies abnormal skin rashes MSK: (+) known hip arthritis and bursitis with mild exacerbation, joint stiffness Lymphatics: Denies new lymphadenopathy or easy bruising Neurological:Denies numbness, tingling or new weaknesses Behavioral/Psych: Mood is stable, no new changes  All other systems were reviewed with the patient and are negative.  MEDICAL HISTORY:  Past Medical History:  Diagnosis Date  . Anxiety   . Arthritis   . Back pain   . Bursitis   . Cancer [facility])    right breast cancer 09-2018  . Constipation   . GERD (gastroesophageal reflux disease)    some heart burn from Arimidex  . History of radiation therapy    completed 01-29-2019  . Hyperlipidemia   . Joint pain   . Personal history of radiation therapy   . Rheumatic fever   . Swallowing difficulty     SURGICAL HISTORY: Past Surgical History:  Procedure Laterality Date  . BREAST LUMPECTOMY Right 10/27/2018  . BREAST LUMPECTOMY WITH RADIOACTIVE SEED AND SENTINEL LYMPH NODE BIOPSY Right 10/27/2018   Procedure: RIGHT BREAST LUMPECTOMY WITH RADIOACTIVE SEED AND SENTINEL LYMPH NODE BIOPSY;  Surgeon: Excell Seltzer, MD;  Location: [facility];  Service: General;  Laterality: Right;  . CERVICAL SPINE SURGERY  2009  . COLONOSCOPY    . ENDOMETRIAL ABLATION    . POLYPECTOMY    . TUBAL LIGATION    . tube ligation       I have reviewed the social history and family history with the patient and they are unchanged from  previous note.  ALLERGIES:  is allergic to compazine [prochlorperazine edisylate].  MEDICATIONS:  Current Outpatient Medications  Medication Sig Dispense Refill  . anastrozole (ARIMIDEX) 1 MG tablet TAKE 1 TABLET BY MOUTH EVERY DAY 90 tablet 3  . clonazePAM (KLONOPIN) 0.5 MG tablet Take 0.5 mg by mouth 2 (two) times daily as needed for anxiety.    . meloxicam (MOBIC) 15 MG tablet Take 15 mg by mouth daily.    . Vitamin D, Ergocalciferol, (DRISDOL) 1.25 MG (50000 UNIT) CAPS capsule Take 1 capsule (50,000 Units total) by mouth every 7 (seven) days. 4 capsule 0   No current facility-administered medications for this visit.    PHYSICAL EXAMINATION: ECOG PERFORMANCE STATUS: 0 - Asymptomatic  Vitals:   10/24/19 0940  BP: (!) 145/84  Pulse: 72  Resp: 18  Temp: 97.8 F (36.6 C)  SpO2: 100%   Filed Weights   10/24/19 0940  Weight: 172 lb 1.6 oz (78.1 kg)    GENERAL:alert, no distress and comfortable SKIN: skin color, texture, turgor are normal, no rashes or significant lesions EYES: normal, Conjunctiva are pink and non-injected, sclera clear  NECK: supple, thyroid normal size, non-tender, without nodularity LYMPH:  no palpable lymphadenopathy in the cervical,  axillary  LUNGS: clear to auscultation and percussion with normal breathing effort HEART: regular rate & rhythm and no murmurs and no lower extremity edema ABDOMEN:abdomen soft, non-tender and normal bowel sounds Musculoskeletal:no cyanosis of digits and no clubbing  NEURO: alert & oriented x 3 with fluent speech, no focal motor/sensory deficits BREAST: S/p right lumpectomy: Surgical incision healed well. (+) Mild right breast skin hyperpigmentation, almost normal color(+) Left nipple mild inversion. No palpable mass, nodules or adenopathy bilaterally. Breast exam benign.    LABORATORY DATA:  I have reviewed the data as listed CBC Latest Ref Rng & Units 09/05/2019 05/15/2019 02/26/2019  WBC 3.4 - 10.8 x10E3/uL 5.4 5.4 4.9    Hemoglobin 11.1 - 15.9 g/dL 13.6 13.3 13.2  Hematocrit 34.0 - 46.6 % 41.1 41.0 40.8  Platelets 150 - 450 x10E3/uL 235 257 226     CMP Latest Ref Rng & Units 09/05/2019 05/15/2019 02/26/2019  Glucose 65 - 99 mg/dL 78 98 88  BUN 6 - 24 mg/dL _0 Creatinine 0.57 - 1.00 mg/dL 0.74 0.64 0.75  Sodium 134 - 144 mmol/L 142 139 139  Potassium 3.5 - 5.2 mmol/L 4.5 4.5 4.4  Chloride 96 - 106 mmol/L 103 100 107  CO2 20 - 29 mmol/L _1 Calcium 8.7 - 10.2 mg/dL 10.0 9.8 9.4  Total Protein 6.0 - 8.5 g/dL 7.2 7.5 7.5  Total Bilirubin 0.0 - 1.2 mg/dL 0.3 0.4 0.3  Alkaline Phos 39 - 117 IU/L 90 102 81  AST 0 - 40 IU/L _2 ALT 0 - 32 IU/L _3 Colonoscopy by Dr Lyndel Safe 05/25/19 IMPRESSION -Colonic polyps s/p polypectomy. -Mild sigmoid diverticulosis -Small internal and external hemorrhoids. -Otherwise normal colonoscopy to TI. Diagnosis Surgical [P], colon, descending, sigmoid - TUBULAR ADENOMA. - NO HIGH GRADE DYSPLASIA OR MALIGNANCY. - HYPERPLASTIC POLYP.    RADIOGRAPHIC STUDIES: I have personally reviewed the radiological images as listed and agreed with the findings in the report. No results found.   ASSESSMENT & PLAN:  Heather Hayes is a 59 y.o. female with    1.Malignant neoplasm of upper-outer quadrant of right breast, StageIA,p(T1b,N0,M0), ER/PR+,HER2-,Grade II -She was diagnosed in 10/2018. She underwent rightlumpectomywith SLNB on 10/27/18. Her surgical pathologyshowed complete resection, negative margins, node negative. -Given low grade T1b ER/PR strongly positive disease, I did not recommend Oncotype -She completed Radiation therapy at [facility] on 01/29/19.  -She started antiestrogen therapy with Anastrozole in 03/2019. Overall tolerable with mild hot flashers and mild exacerbation of arthritis, which are both manageable.  -She is clinically doing well. Lab reviewed, her CBC and CMP are within normal limits. Her physical exam and her  10/2019 mammogram were unremarkable. There is no clinical concern for recurrence. -Continue surveillance. Next Mammogram in 09/2020 -Continue anastrozole  -F/u in 6 months    2. Bone Health  -Her 10/25/17 DEXA was normal with T-score 0.1 of b/l hips.  -I discussed Anastrozole can reduce her bone density. Will monitor with DEXA every 2 years. Next in 2021-2022   3. Overweight  -She is concerned with obesity and health complications from it.  -She has been working with Dr. Juleen China at [facility] clinic. So far she has lost 23 pounds and her overall health is improving. I encouraged her to continue.    4.She had genetic testingin 2019 with Dr. Lessie Dings was negative.   5. Arthritis and bursitis of her hips -Has been mildly exacerbated  by anastrozole along with joint stiffness. Still manageable on Meloxicam and Voltaren -I encouraged her to remain active to help.    PLAN: -She is clinically doing well  -Continue Anastrozole  -Lab and f/u in 6 months. OK to change to virtual visit if needed    No problem-specific Assessment & Plan notes found for this encounter.   No orders of the defined types were placed in this encounter.  All questions were answered. The patient knows to call the clinic with any problems, questions or concerns. No barriers to learning was detected. The total time spent in the appointment was 25 minutes.    Truitt Merle, MD 10/24/2019   I, Joslyn Devon, am acting as scribe for Truitt Merle, MD.   I have reviewed the above documentation for accuracy and completeness, and I agree with the above.

## 2019-10-24 ENCOUNTER — Encounter: Payer: Self-pay | Admitting: Hematology

## 2019-10-24 ENCOUNTER — Encounter (INDEPENDENT_AMBULATORY_CARE_PROVIDER_SITE_OTHER): Payer: Self-pay | Admitting: Family Medicine

## 2019-10-24 ENCOUNTER — Inpatient Hospital Stay: Payer: BC Managed Care – PPO | Attending: Hematology | Admitting: Hematology

## 2019-10-24 ENCOUNTER — Other Ambulatory Visit: Payer: 59

## 2019-10-24 ENCOUNTER — Telehealth: Payer: Self-pay | Admitting: Pain Medicine

## 2019-10-24 ENCOUNTER — Ambulatory Visit (INDEPENDENT_AMBULATORY_CARE_PROVIDER_SITE_OTHER): Payer: BC Managed Care – PPO | Admitting: Family Medicine

## 2019-10-24 ENCOUNTER — Other Ambulatory Visit: Payer: Self-pay

## 2019-10-24 VITALS — BP 112/74 | HR 78 | Temp 98.2°F | Ht 63.0 in | Wt 165.0 lb

## 2019-10-24 VITALS — BP 145/84 | HR 72 | Temp 97.8°F | Resp 18 | Ht 63.0 in | Wt 172.1 lb

## 2019-10-24 DIAGNOSIS — E669 Obesity, unspecified: Secondary | ICD-10-CM

## 2019-10-24 DIAGNOSIS — F419 Anxiety disorder, unspecified: Secondary | ICD-10-CM | POA: Insufficient documentation

## 2019-10-24 DIAGNOSIS — C50411 Malignant neoplasm of upper-outer quadrant of right female breast: Secondary | ICD-10-CM | POA: Diagnosis not present

## 2019-10-24 DIAGNOSIS — Z791 Long term (current) use of non-steroidal anti-inflammatories (NSAID): Secondary | ICD-10-CM | POA: Insufficient documentation

## 2019-10-24 DIAGNOSIS — Z79811 Long term (current) use of aromatase inhibitors: Secondary | ICD-10-CM | POA: Diagnosis not present

## 2019-10-24 DIAGNOSIS — F3289 Other specified depressive episodes: Secondary | ICD-10-CM | POA: Diagnosis not present

## 2019-10-24 DIAGNOSIS — Z79899 Other long term (current) drug therapy: Secondary | ICD-10-CM | POA: Diagnosis not present

## 2019-10-24 DIAGNOSIS — Z6830 Body mass index (BMI) 30.0-30.9, adult: Secondary | ICD-10-CM

## 2019-10-24 DIAGNOSIS — Z170 Estrogen receptor positive status [ER+]: Secondary | ICD-10-CM | POA: Insufficient documentation

## 2019-10-24 DIAGNOSIS — Z923 Personal history of irradiation: Secondary | ICD-10-CM | POA: Diagnosis not present

## 2019-10-24 DIAGNOSIS — Z853 Personal history of malignant neoplasm of breast: Secondary | ICD-10-CM

## 2019-10-24 DIAGNOSIS — E559 Vitamin D deficiency, unspecified: Secondary | ICD-10-CM | POA: Diagnosis not present

## 2019-10-24 DIAGNOSIS — E785 Hyperlipidemia, unspecified: Secondary | ICD-10-CM | POA: Diagnosis not present

## 2019-10-24 NOTE — Telephone Encounter (Signed)
Scheduled appt per 3/24 los. Pt declined AVS print out and stated that she would refer to [place].

## 2019-10-24 NOTE — Progress Notes (Signed)
Chief Complaint:   OBESITY Heather Hayes is here to discuss her progress with her obesity treatment plan along with follow-up of her obesity related diagnoses. Heather Hayes is on the Category 2 Plan and states she is following her eating plan approximately 75% of the time. Heather Hayes states she is walking on the treadmill for 30 minutes 2-3 times per week.  Today's visit was #: 11 Starting weight: 191 lbs Starting date: 05/15/2019 Today's weight: 165 lbs Today's date: 10/24/2019 Total lbs lost to date: 26 lbs Total lbs lost since last in-office visit: 3 lbs  Interim History: Heather Hayes says she is happy with her recent weight loss.  She had a family vacation at the beach and made good choices.  She reports that she tried the [employer] after her last visit, but prefers Category 2.  Subjective:   1. Vitamin D deficiency Heather Hayes's Vitamin D level was 43.7 on 09/05/2019. She is currently taking vit D. She denies nausea, vomiting or muscle weakness.  2. History of breast cancer Heather Hayes has a history of breast cancer and is taking arimidex.  3. Other depression, with emotional eating  Heather Hayes is struggling with emotional eating and using food for comfort to the extent that it is negatively impacting her health. She has been working on behavior modification techniques to help reduce her emotional eating and has been successful. She shows no sign of suicidal or homicidal ideations.  Assessment/Plan:   1. Vitamin D deficiency Low Vitamin D level contributes to fatigue and are associated with obesity, breast, and colon cancer. She agrees to continue to take prescription Vitamin D @50 ,000 IU every week and will follow-up for routine testing of Vitamin D, at least 2-3 times per year to avoid over-replacement.  2. History of breast cancer Current treatment plan is effective, no change in therapy. Orders and follow up as documented in patient record.  3. Other depression, with emotional eating  Behavior modification  techniques were discussed today to help Heather Hayes deal with her emotional/non-hunger eating behaviors.  Orders and follow up as documented in patient record.   4. Class 1 obesity with serious comorbidity and body mass index (BMI) of 30.0 to 30.9 in adult, unspecified obesity type Heather Hayes is currently in the action stage of change. As such, her goal is to continue with weight loss efforts. She has agreed to the Category 2 Plan.   Exercise goals: As is.  Increase strength (plays with and holds grandchildren)  Behavioral modification strategies: increasing lean protein intake, decreasing simple carbohydrates and increasing water intake.  Heather Hayes has agreed to follow-up with our clinic in 2 weeks. She was informed of the importance of frequent follow-up visits to maximize her success with intensive lifestyle modifications for her multiple health conditions.   Objective:   Blood pressure 112/74, pulse 78, temperature 98.2 F (36.8 C), temperature source Oral, height 5\' 3"  (1.6 m), weight 165 lb (74.8 kg), SpO2 98 %. Body mass index is 29.23 kg/m.  General: Cooperative, alert, well developed, in no acute distress. HEENT: Conjunctivae and lids unremarkable. Cardiovascular: Regular rhythm.  Lungs: Normal work of breathing. Neurologic: No focal deficits.   Lab Results  Component Value Date   CREATININE 0.74 09/05/2019   BUN 8 09/05/2019   NA 142 09/05/2019   K 4.5 09/05/2019   CL 103 09/05/2019   CO2 24 09/05/2019   Lab Results  Component Value Date   ALT 16 09/05/2019   AST 19 09/05/2019   ALKPHOS 90 09/05/2019  BILITOT 0.3 09/05/2019   Lab Results  Component Value Date   HGBA1C 5.5 09/05/2019   HGBA1C 5.4 05/15/2019   Lab Results  Component Value Date   INSULIN 8.3 09/05/2019   INSULIN 14.9 05/15/2019   Lab Results  Component Value Date   TSH 1.560 05/15/2019   Lab Results  Component Value Date   CHOL 200 (H) 09/05/2019   HDL 65 09/05/2019   LDLCALC 110 (H) 09/05/2019   TRIG  146 09/05/2019   CHOLHDL 3.1 09/05/2019   Lab Results  Component Value Date   WBC 5.4 09/05/2019   HGB 13.6 09/05/2019   HCT 41.1 09/05/2019   MCV 92 09/05/2019   PLT 235 09/05/2019   Attestation Statements:   Reviewed by clinician on day of visit: allergies, medications, problem list, medical history, surgical history, family history, social history, and previous encounter notes.  I, Water quality scientist, CMA, am acting as Location manager for [employer], DO.  I have reviewed the above documentation for accuracy and completeness, and I agree with the above. Briscoe Deutscher, DO

## 2019-11-07 ENCOUNTER — Ambulatory Visit (INDEPENDENT_AMBULATORY_CARE_PROVIDER_SITE_OTHER): Payer: BC Managed Care – PPO | Admitting: Family Medicine

## 2019-11-14 ENCOUNTER — Other Ambulatory Visit: Payer: Self-pay

## 2019-11-14 ENCOUNTER — Encounter (INDEPENDENT_AMBULATORY_CARE_PROVIDER_SITE_OTHER): Payer: Self-pay | Admitting: Family Medicine

## 2019-11-14 ENCOUNTER — Ambulatory Visit (INDEPENDENT_AMBULATORY_CARE_PROVIDER_SITE_OTHER): Payer: BC Managed Care – PPO | Admitting: Family Medicine

## 2019-11-14 VITALS — BP 102/66 | HR 64 | Temp 98.0°F | Ht 63.0 in | Wt 165.0 lb

## 2019-11-14 DIAGNOSIS — E669 Obesity, unspecified: Secondary | ICD-10-CM | POA: Diagnosis not present

## 2019-11-14 DIAGNOSIS — Z853 Personal history of malignant neoplasm of breast: Secondary | ICD-10-CM

## 2019-11-14 DIAGNOSIS — E559 Vitamin D deficiency, unspecified: Secondary | ICD-10-CM | POA: Diagnosis not present

## 2019-11-14 DIAGNOSIS — Z9189 Other specified personal risk factors, not elsewhere classified: Secondary | ICD-10-CM | POA: Diagnosis not present

## 2019-11-14 DIAGNOSIS — Z6830 Body mass index (BMI) 30.0-30.9, adult: Secondary | ICD-10-CM

## 2019-11-14 NOTE — Progress Notes (Signed)
Chief Complaint:   OBESITY Heather Hayes is here to discuss her progress with her obesity treatment plan along with follow-up of her obesity related diagnoses. Heather Hayes is on the Category 2 Plan and states she is following her eating plan approximately 80% of the time. Heather Hayes states she is using the treadmill for 30-35 minutes 2-3 times per week.  Today's visit was #: 12 Starting weight: 191 lbs Starting date: 05/15/2019 Today's weight: 165 lbs Today's date: 11/14/2019 Total lbs lost to date: 26 lbs Total lbs lost since last in-office visit: 0  Interim History: Heather Hayes has an upcoming birthday and anniversary.  She reports that she is still happy with her progress and recognizes that she has periodic plateaus.    Subjective:   1. Vitamin D deficiency Heather Hayes's Vitamin D level was 43.7 on 09/05/2019. She is currently taking OTC vitamin D 5000 units each day. She denies nausea, vomiting or muscle weakness.  2. History of breast cancer Heather Hayes is taking anastrozole.  3. At risk for complication associated with hypotension The patient is at a higher than average risk of hypotension due to weight loss. She does not reports symptoms of orthostatic hypotension. She is not take any BP medications.  BP Readings from Last 3 Encounters:  11/14/19 102/66  10/24/19 112/74  10/24/19 (!) 145/84   Assessment/Plan:   1. Vitamin D deficiency Low Vitamin D level contributes to fatigue and are associated with obesity, breast, and colon cancer. She agrees to continue to take OTC Vitamin D @5 ,000 IU daily and will follow-up for routine testing of Vitamin D, at least 2-3 times per year to avoid over-replacement.  2. History of breast cancer, taking Tamoxifen Current treatment plan is effective, no change in therapy. Counseling: Intensive lifestyle modifications are the first line treatment for this issue. We discussed several lifestyle modifications today and she will continue to work on diet, exercise and weight loss  efforts. We will continue to monitor.  3. At risk for complication associated with hypotension Heather Hayes was given approximately 15 minutes of education and counseling today to help avoid hypotension. We discussed risks of hypotension with weight loss and signs of hypotension such as feeling lightheaded or unsteady.  Repetitive spaced learning was employed today to elicit superior memory formation and behavioral change.  4. Class 1 obesity with serious comorbidity and body mass index (BMI) of 30.0 to 30.9 in adult, unspecified obesity type Heather Hayes is currently in the action stage of change. As such, her goal is to continue with weight loss efforts. She has agreed to the Category 2 Plan.   Exercise goals: For substantial health benefits, adults should do at least 150 minutes (2 hours and 30 minutes) a week of moderate-intensity, or 75 minutes (1 hour and 15 minutes) a week of vigorous-intensity aerobic physical activity, or an equivalent combination of moderate- and vigorous-intensity aerobic activity. Aerobic activity should be performed in episodes of at least 10 minutes, and preferably, it should be spread throughout the week.  Behavioral modification strategies: increasing lean protein intake and increasing water intake.  Heather Hayes has agreed to follow-up with our clinic in 2 weeks. She was informed of the importance of frequent follow-up visits to maximize her success with intensive lifestyle modifications for her multiple health conditions.   Objective:   Blood pressure 102/66, pulse 64, temperature 98 F (36.7 C), temperature source Oral, height 5\' 3"  (1.6 m), weight 165 lb (74.8 kg), SpO2 98 %. Body mass index is 29.23 kg/m.  General: Cooperative,  alert, well developed, in no acute distress. HEENT: Conjunctivae and lids unremarkable. Cardiovascular: Regular rhythm.  Lungs: Normal work of breathing. Neurologic: No focal deficits.   Lab Results  Component Value Date   CREATININE 0.74  09/05/2019   BUN 8 09/05/2019   NA 142 09/05/2019   K 4.5 09/05/2019   CL 103 09/05/2019   CO2 24 09/05/2019   Lab Results  Component Value Date   ALT 16 09/05/2019   AST 19 09/05/2019   ALKPHOS 90 09/05/2019   BILITOT 0.3 09/05/2019   Lab Results  Component Value Date   HGBA1C 5.5 09/05/2019   HGBA1C 5.4 05/15/2019   Lab Results  Component Value Date   INSULIN 8.3 09/05/2019   INSULIN 14.9 05/15/2019   Lab Results  Component Value Date   TSH 1.560 05/15/2019   Lab Results  Component Value Date   CHOL 200 (H) 09/05/2019   HDL 65 09/05/2019   LDLCALC 110 (H) 09/05/2019   TRIG 146 09/05/2019   CHOLHDL 3.1 09/05/2019   Lab Results  Component Value Date   WBC 5.4 09/05/2019   HGB 13.6 09/05/2019   HCT 41.1 09/05/2019   MCV 92 09/05/2019   PLT 235 09/05/2019   Attestation Statements:   Reviewed by clinician on day of visit: allergies, medications, problem list, medical history, surgical history, family history, social history, and previous encounter notes.  I, Water quality scientist, CMA, am acting as Location manager for [employer], DO.  I have reviewed the above documentation for accuracy and completeness, and I agree with the above. Briscoe Deutscher, DO

## 2019-12-06 ENCOUNTER — Ambulatory Visit (INDEPENDENT_AMBULATORY_CARE_PROVIDER_SITE_OTHER): Payer: BC Managed Care – PPO | Admitting: Family Medicine

## 2019-12-12 ENCOUNTER — Other Ambulatory Visit: Payer: Self-pay

## 2019-12-12 ENCOUNTER — Encounter (INDEPENDENT_AMBULATORY_CARE_PROVIDER_SITE_OTHER): Payer: Self-pay | Admitting: Family Medicine

## 2019-12-12 ENCOUNTER — Ambulatory Visit (INDEPENDENT_AMBULATORY_CARE_PROVIDER_SITE_OTHER): Payer: BC Managed Care – PPO | Admitting: Family Medicine

## 2019-12-12 VITALS — BP 112/74 | HR 62 | Temp 98.6°F | Ht 63.0 in | Wt 164.0 lb

## 2019-12-12 DIAGNOSIS — E7800 Pure hypercholesterolemia, unspecified: Secondary | ICD-10-CM

## 2019-12-12 DIAGNOSIS — Z6830 Body mass index (BMI) 30.0-30.9, adult: Secondary | ICD-10-CM

## 2019-12-12 DIAGNOSIS — E669 Obesity, unspecified: Secondary | ICD-10-CM

## 2019-12-12 DIAGNOSIS — Z9189 Other specified personal risk factors, not elsewhere classified: Secondary | ICD-10-CM

## 2019-12-12 DIAGNOSIS — E538 Deficiency of other specified B group vitamins: Secondary | ICD-10-CM | POA: Diagnosis not present

## 2019-12-12 DIAGNOSIS — E559 Vitamin D deficiency, unspecified: Secondary | ICD-10-CM

## 2019-12-12 DIAGNOSIS — R0989 Other specified symptoms and signs involving the circulatory and respiratory systems: Secondary | ICD-10-CM | POA: Diagnosis not present

## 2019-12-12 NOTE — Progress Notes (Signed)
Chief Complaint:   OBESITY Heather Hayes is here to discuss her progress with her obesity treatment plan along with follow-up of her obesity related diagnoses. Heather Hayes is on the Category 2 Plan and states she is following her eating plan approximately 75% of the time. Heather Hayes states she is 30-35 minutes 2-3 times per week.  Today's visit was #: 83 Starting weight: 191 lbs Starting date: 05/15/2019 Today's weight: 164 lbs Today's date: 12/12/2019 Total lbs lost to date: 27 lbs Total lbs lost since last in-office visit: 1 lb  Interim History: Heather Hayes is down 27 pounds total today.  She has done a lot of celebration eating and a friend's husband passed from COVID-related complications.  She felt that she made mindful food choices.  We will check labs at next visit.  Subjective:   1. Pure hypercholesterolemia Heather Hayes has hyperlipidemia and has been trying to improve her cholesterol levels with intensive lifestyle modification including a low saturated fat diet, exercise and weight loss. She denies any chest pain, claudication or myalgias.  Lab Results  Component Value Date   ALT 16 09/05/2019   AST 19 09/05/2019   ALKPHOS 90 09/05/2019   BILITOT 0.3 09/05/2019   Lab Results  Component Value Date   CHOL 200 (H) 09/05/2019   HDL 65 09/05/2019   LDLCALC 110 (H) 09/05/2019   TRIG 146 09/05/2019   CHOLHDL 3.1 09/05/2019   2. Vitamin D deficiency Heather Hayes's Vitamin D level was 43.7 on 09/05/2019. She is currently taking OTC vitamin D 5000 IU each day. She denies nausea, vomiting or muscle weakness.  3. Vitamin B12 deficiency She is not a vegetarian.  She does not have a previous diagnosis of pernicious anemia.  She does not have a history of weight loss surgery.   Lab Results  Component Value Date   VITAMINB12 382 09/05/2019   4. Abnormal vasomotor function, medication induced Heather Hayes has vasomotor symptoms due to taking Arimidex.  5. At risk for heart disease Heather Hayes is at a higher than average risk  for cardiovascular disease due to obesity.   Assessment/Plan:   1. Pure hypercholesterolemia Cardiovascular risk and specific lipid/LDL goals reviewed.  We discussed several lifestyle modifications today and Heather Hayes will continue to work on diet, exercise and weight loss efforts. Orders and follow up as documented in patient record.   Counseling Intensive lifestyle modifications are the first line treatment for this issue. . Dietary changes: Increase soluble fiber. Decrease simple carbohydrates. . Exercise changes: Moderate to vigorous-intensity aerobic activity 150 minutes per week if tolerated. . Lipid-lowering medications: see documented in medical record.  2. Vitamin D deficiency Low Vitamin D level contributes to fatigue and are associated with obesity, breast, and colon cancer. She agrees to continue to take OTC Vitamin D @5 ,000 IU daily and will follow-up for routine testing of Vitamin D, at least 2-3 times per year to avoid over-replacement.  3. Vitamin B12 deficiency The diagnosis was reviewed with the patient. Counseling provided today, see below. We will continue to monitor. Orders and follow up as documented in patient record.  Counseling . The body needs vitamin B12: to make red blood cells; to make DNA; and to help the nerves work properly so they can carry messages from the brain to the body.  . The main causes of vitamin B12 deficiency include dietary deficiency, digestive diseases, pernicious anemia, and having a surgery in which part of the stomach or small intestine is removed.  . Certain medicines can make it harder  for the body to absorb vitamin B12. These medicines include: heartburn medications; some antibiotics; some medications used to treat diabetes, gout, and high cholesterol.  . In some cases, there are no symptoms of this condition. If the condition leads to anemia or nerve damage, various symptoms can occur, such as weakness or fatigue, shortness of breath, and  numbness or tingling in your hands and feet.   . Treatment:  o May include taking vitamin B12 supplements.  o Avoid alcohol.  o Eat lots of healthy foods that contain vitamin B12: - Beef, pork, chicken, [place], and organ meats, such as liver.  - Seafood: This includes clams, rainbow trout, salmon, tuna, and haddock. Eggs.  - Cereal and dairy products that are fortified: This means that vitamin B12 has been added to the food.   4. Abnormal vasomotor function, medication induced Will continue to monitor.  5. At risk for heart disease Heather Hayes was given approximately 15 minutes of coronary artery disease prevention counseling today. She is 59 y.o. female and has risk factors for heart disease including obesity. We discussed intensive lifestyle modifications today with an emphasis on specific weight loss instructions and strategies.   Repetitive spaced learning was employed today to elicit superior memory formation and behavioral change.  6. Class 1 obesity with serious comorbidity and body mass index (BMI) of 30.0 to 30.9 in adult, unspecified obesity type Heather Hayes is currently in the action stage of change. As such, her goal is to continue with weight loss efforts. She has agreed to the Category 2 Plan.   Exercise goals: As is.  Behavioral modification strategies: increasing water intake and emotional eating strategies.  Heather Hayes has agreed to follow-up with our clinic in 2 weeks. She was informed of the importance of frequent follow-up visits to maximize her success with intensive lifestyle modifications for her multiple health conditions.   Objective:   Blood pressure 112/74, pulse 62, temperature 98.6 F (37 C), temperature source Oral, height 5\' 3"  (1.6 m), weight 164 lb (74.4 kg), SpO2 100 %. Body mass index is 29.05 kg/m.  General: Cooperative, alert, well developed, in no acute distress. HEENT: Conjunctivae and lids unremarkable. Cardiovascular: Regular rhythm.  Lungs: Normal work of  breathing. Neurologic: No focal deficits.   Lab Results  Component Value Date   CREATININE 0.74 09/05/2019   BUN 8 09/05/2019   NA 142 09/05/2019   K 4.5 09/05/2019   CL 103 09/05/2019   CO2 24 09/05/2019   Lab Results  Component Value Date   ALT 16 09/05/2019   AST 19 09/05/2019   ALKPHOS 90 09/05/2019   BILITOT 0.3 09/05/2019   Lab Results  Component Value Date   HGBA1C 5.5 09/05/2019   HGBA1C 5.4 05/15/2019   Lab Results  Component Value Date   INSULIN 8.3 09/05/2019   INSULIN 14.9 05/15/2019   Lab Results  Component Value Date   TSH 1.560 05/15/2019   Lab Results  Component Value Date   CHOL 200 (H) 09/05/2019   HDL 65 09/05/2019   LDLCALC 110 (H) 09/05/2019   TRIG 146 09/05/2019   CHOLHDL 3.1 09/05/2019   Lab Results  Component Value Date   WBC 5.4 09/05/2019   HGB 13.6 09/05/2019   HCT 41.1 09/05/2019   MCV 92 09/05/2019   PLT 235 09/05/2019   Attestation Statements:   Reviewed by clinician on day of visit: allergies, medications, problem list, medical history, surgical history, family history, social history, and previous encounter notes.  I, Water quality scientist,  CMA, am acting as transcriptionist for [employer], DO.  I have reviewed the above documentation for accuracy and completeness, and I agree with the above. Briscoe Deutscher, DO

## 2019-12-19 DIAGNOSIS — L600 Ingrowing nail: Secondary | ICD-10-CM | POA: Diagnosis not present

## 2020-01-09 ENCOUNTER — Ambulatory Visit (INDEPENDENT_AMBULATORY_CARE_PROVIDER_SITE_OTHER): Payer: BC Managed Care – PPO | Admitting: Family Medicine

## 2020-01-15 DIAGNOSIS — R109 Unspecified abdominal pain: Secondary | ICD-10-CM | POA: Diagnosis not present

## 2020-01-15 DIAGNOSIS — R197 Diarrhea, unspecified: Secondary | ICD-10-CM | POA: Diagnosis not present

## 2020-01-16 DIAGNOSIS — R197 Diarrhea, unspecified: Secondary | ICD-10-CM | POA: Diagnosis not present

## 2020-01-16 DIAGNOSIS — R109 Unspecified abdominal pain: Secondary | ICD-10-CM | POA: Diagnosis not present

## 2020-01-22 ENCOUNTER — Other Ambulatory Visit: Payer: Self-pay

## 2020-01-22 ENCOUNTER — Encounter (INDEPENDENT_AMBULATORY_CARE_PROVIDER_SITE_OTHER): Payer: Self-pay | Admitting: Adult Health

## 2020-01-22 ENCOUNTER — Ambulatory Visit (INDEPENDENT_AMBULATORY_CARE_PROVIDER_SITE_OTHER): Payer: BC Managed Care – PPO | Admitting: Adult Health

## 2020-01-22 VITALS — BP 134/76 | HR 62 | Temp 98.0°F | Ht 63.0 in | Wt 164.0 lb

## 2020-01-22 DIAGNOSIS — E559 Vitamin D deficiency, unspecified: Secondary | ICD-10-CM | POA: Diagnosis not present

## 2020-01-22 DIAGNOSIS — Z6833 Body mass index (BMI) 33.0-33.9, adult: Secondary | ICD-10-CM | POA: Insufficient documentation

## 2020-01-22 DIAGNOSIS — Z9189 Other specified personal risk factors, not elsewhere classified: Secondary | ICD-10-CM | POA: Diagnosis not present

## 2020-01-22 DIAGNOSIS — E8881 Metabolic syndrome: Secondary | ICD-10-CM | POA: Diagnosis not present

## 2020-01-22 DIAGNOSIS — Z853 Personal history of malignant neoplasm of breast: Secondary | ICD-10-CM | POA: Diagnosis not present

## 2020-01-22 DIAGNOSIS — E669 Obesity, unspecified: Secondary | ICD-10-CM | POA: Insufficient documentation

## 2020-01-22 DIAGNOSIS — Z6830 Body mass index (BMI) 30.0-30.9, adult: Secondary | ICD-10-CM

## 2020-01-22 NOTE — Progress Notes (Signed)
Chief Complaint:   OBESITY Heather Hayes is here to discuss her progress with her obesity treatment plan along with follow-up of her obesity related diagnoses. Heather Hayes is on the Category 2 Plan and states she is following her eating plan approximately 75% of the time. Heather Hayes states she is walking on the treadmill 30 minutes 1 time per week.  Today's visit was #: 14 Starting weight: 191 lbs Starting date: 05/15/2019 Today's weight: 164 lbs Today's date: 01/22/2020 Total lbs lost to date: 27 Total lbs lost since last in-office visit: 0  Interim History: Heather Hayes recently traveled to [place] and enjoyed some foods off the plan. Upon returning home, she developed right upper quadrant pain and diarrhea. Her PCP ran labs and these were reviewed with the patient today - results within normal limits. She reports resolution of GI symptoms.  Subjective:   Vitamin D deficiency. Heather Hayes is on OTC Vitamin D supplementation, which she is tolerating well. Last Vitamin D was 43.7 on 09/05/2019.  History of breast cancer. Heather Hayes is followed by Oncology. She is on anastrozole 1 mg daily and reports exercise improves medication side effects, ie: abnormal vasomotor function.  Insulin resistance. Heather Hayes has a diagnosis of insulin resistance based on her elevated fasting insulin level >5. She continues to work on diet and exercise to decrease her risk of diabetes. She is not on metformin. Polyphagia is denied.  Lab Results  Component Value Date   INSULIN 8.3 09/05/2019   INSULIN 14.9 05/15/2019   Lab Results  Component Value Date   HGBA1C 5.5 09/05/2019   At risk for diabetes mellitus. Heather Hayes is at higher than average risk for developing diabetes due to insulin resistance.  Assessment/Plan:   Vitamin D deficiency. Low Vitamin D level contributes to fatigue and are associated with obesity, breast, and colon cancer. VITAMIN D 25 Hydroxy (Vit-D Deficiency, Fractures) level will be checked today.  History of  breast cancer. Heather Hayes will continue to follow-up with Oncology as directed.  Insulin resistance. Heather Hayes will continue to work on weight loss, exercise, and decreasing simple carbohydrates to help decrease the risk of diabetes. Heather Hayes agreed to follow-up with [name] as directed to closely monitor her progress. Hemoglobin A1c, Insulin, random, Lipid Panel With LDL/HDL Ratio labs will be checked today.  At risk for diabetes mellitus. Heather Hayes was given approximately 15 minutes of diabetes education and counseling today. We discussed intensive lifestyle modifications today with an emphasis on weight loss as well as increasing exercise and decreasing simple carbohydrates in her diet. We also reviewed medication options with an emphasis on risk versus benefit of those discussed.   Repetitive spaced learning was employed today to elicit superior memory formation and behavioral change.  Class 1 obesity with serious comorbidity and body mass index (BMI) of 30.0 to 30.9 in adult, unspecified obesity type - Starting BMI greater then 30.  Heather Hayes is currently in the action stage of change. As such, her goal is to continue with weight loss efforts. She has agreed to the Category 2 Plan.   Exercise goals: Heather Hayes will continue her current exercise regimen.  Behavioral modification strategies: increasing lean protein intake, increasing water intake, meal planning and cooking strategies and travel eating strategies.  Heather Hayes has agreed to follow-up with our clinic in 2 weeks. She was informed of the importance of frequent follow-up visits to maximize her success with intensive lifestyle modifications for her multiple health conditions.   Heather Hayes was informed we would discuss her lab results at  her next visit unless there is a critical issue that needs to be addressed sooner. Heather Hayes agreed to keep her next visit at the agreed upon time to discuss these results.  Objective:   Blood pressure 134/76, pulse 62, temperature 98 F (36.7 C),  temperature source Oral, height 5\' 3"  (1.6 m), weight 164 lb (74.4 kg), SpO2 100 %. Body mass index is 29.05 kg/m.  General: Cooperative, alert, well developed, in no acute distress. HEENT: Conjunctivae and lids unremarkable. Cardiovascular: Regular rhythm.  Lungs: Normal work of breathing. Neurologic: No focal deficits.   Lab Results  Component Value Date   CREATININE 0.74 09/05/2019   BUN 8 09/05/2019   NA 142 09/05/2019   K 4.5 09/05/2019   CL 103 09/05/2019   CO2 24 09/05/2019   Lab Results  Component Value Date   ALT 16 09/05/2019   AST 19 09/05/2019   ALKPHOS 90 09/05/2019   BILITOT 0.3 09/05/2019   Lab Results  Component Value Date   HGBA1C 5.5 09/05/2019   HGBA1C 5.4 05/15/2019   Lab Results  Component Value Date   INSULIN 8.3 09/05/2019   INSULIN 14.9 05/15/2019   Lab Results  Component Value Date   TSH 1.560 05/15/2019   Lab Results  Component Value Date   CHOL 200 (H) 09/05/2019   HDL 65 09/05/2019   LDLCALC 110 (H) 09/05/2019   TRIG 146 09/05/2019   CHOLHDL 3.1 09/05/2019   Lab Results  Component Value Date   WBC 5.4 09/05/2019   HGB 13.6 09/05/2019   HCT 41.1 09/05/2019   MCV 92 09/05/2019   PLT 235 09/05/2019   No results found for: IRON, TIBC, FERRITIN  Attestation Statements:   Reviewed by clinician on day of visit: allergies, medications, problem list, medical history, surgical history, family history, social history, and previous encounter notes.  I, Michaelene Song, am acting as Location manager for [employer], NP-C   I have reviewed the above documentation for accuracy and completeness, and I agree with the above. -  Esaw Grandchild, NP

## 2020-01-23 LAB — LIPID PANEL WITH LDL/HDL RATIO
Cholesterol, Total: 202 mg/dL — ABNORMAL HIGH (ref 100–199)
HDL: 66 mg/dL (ref >39)
LDL Chol Calc (NIH): 112 mg/dL — ABNORMAL HIGH (ref 0–99)
LDL/HDL Ratio: 1.7 ratio (ref 0.0–3.2)
Triglycerides: 138 mg/dL (ref 0–149)
VLDL Cholesterol Cal: 24 mg/dL (ref 5–40)

## 2020-01-23 LAB — VITAMIN D 25 HYDROXY (VIT D DEFICIENCY, FRACTURES): Vit D, 25-Hydroxy: 51.7 ng/mL (ref 30.0–100.0)

## 2020-01-23 LAB — HEMOGLOBIN A1C
Est. average glucose Bld gHb Est-mCnc: 111 mg/dL
Hgb A1c MFr Bld: 5.5 % (ref 4.8–5.6)

## 2020-01-23 LAB — INSULIN, RANDOM: INSULIN: 7.5 u[IU]/mL (ref 2.6–24.9)

## 2020-02-12 ENCOUNTER — Other Ambulatory Visit: Payer: Self-pay

## 2020-02-12 ENCOUNTER — Encounter (INDEPENDENT_AMBULATORY_CARE_PROVIDER_SITE_OTHER): Payer: Self-pay | Admitting: Adult Health

## 2020-02-12 ENCOUNTER — Ambulatory Visit (INDEPENDENT_AMBULATORY_CARE_PROVIDER_SITE_OTHER): Payer: BC Managed Care – PPO | Admitting: Adult Health

## 2020-02-12 VITALS — BP 127/85 | HR 63 | Temp 97.7°F | Ht 63.0 in | Wt 164.0 lb

## 2020-02-12 DIAGNOSIS — Z6830 Body mass index (BMI) 30.0-30.9, adult: Secondary | ICD-10-CM

## 2020-02-12 DIAGNOSIS — E782 Mixed hyperlipidemia: Secondary | ICD-10-CM | POA: Diagnosis not present

## 2020-02-12 DIAGNOSIS — E669 Obesity, unspecified: Secondary | ICD-10-CM | POA: Diagnosis not present

## 2020-02-12 DIAGNOSIS — E559 Vitamin D deficiency, unspecified: Secondary | ICD-10-CM | POA: Diagnosis not present

## 2020-02-12 DIAGNOSIS — E8881 Metabolic syndrome: Secondary | ICD-10-CM

## 2020-02-12 NOTE — Progress Notes (Signed)
Chief Complaint:   OBESITY Heather Hayes is here to discuss her progress with her obesity treatment plan along with follow-up of her obesity related diagnoses. Heather Hayes is on the Category 2 Plan and states she is following her eating plan approximately 75% of the time. Heather Hayes states she is doing normal daily activities.   Today's visit was #: 15 Starting weight: 191 lbs Starting date: 05/15/2019 Today's weight: 164 lbs Today's date: 02/12/2020 Total lbs lost to date: 17 Total lbs lost since last in-office visit: 0  Interim History: Heather Hayes notes over the last few weeks she celebrated [place] Day with a family cookout. She and her husband traveled to the beach with their children for 5 days. She stayed on track by focusing on protein at each meal and packing snacks/foods that are on the Category 2 meal plan. She denies excessive cravings or polyphagia. Her ultimate weight goal is between 157 and 158 pounds.  Subjective:   Insulin resistance. Heather Hayes has a diagnosis of insulin resistance based on her elevated fasting insulin level >5. She continues to work on diet and exercise to decrease her risk of diabetes. Insulin level is slightly improved from last check. A1c continues to be high normal at 5.5. She is not on metformin and denies polyphagia.  Lab Results  Component Value Date   INSULIN 7.5 01/22/2020   INSULIN 8.3 09/05/2019   INSULIN 14.9 05/15/2019   Lab Results  Component Value Date   HGBA1C 5.5 01/22/2020   Mixed hyperlipidemia. Heather Hayes denies tobacco/vape use. She denies first degree family history of MI/CVA/CAD. She is not on statin therapy.  Lab Results  Component Value Date   CHOL 202 (H) 01/22/2020   HDL 66 01/22/2020   LDLCALC 112 (H) 01/22/2020   TRIG 138 01/22/2020   CHOLHDL 3.1 09/05/2019   Lab Results  Component Value Date   ALT 16 09/05/2019   AST 19 09/05/2019   ALKPHOS 90 09/05/2019   BILITOT 0.3 09/05/2019   The 10-year ASCVD risk score [name],  et al., 2013) is: 2.6%   Values used to calculate the score:     Age: 59 years     Sex: Female     Is Non-Hispanic African American: No     Diabetic: No     Tobacco smoker: No     Systolic Blood Pressure: 841 mmHg     Is BP treated: No     HDL Cholesterol: 66 mg/dL     Total Cholesterol: 202 mg/dL  Vitamin D deficiency. Vitamin D level continues to improve on OTC Vitamin D3 5,000 IU daily, which she is tolerating well.   Ref. Range 01/22/2020 12:22  Vitamin D, 25-Hydroxy Latest Ref Range: 30.0 - 100.0 ng/mL 51.7   Assessment/Plan:   Insulin resistance. [name] will continue to work on weight loss, exercise, and decreasing simple carbohydrates to help decrease the risk of diabetes. Heather Hayes agreed to follow-up with Heather Hayes as directed to closely monitor her progress. She will continue to follow the Category 2 meal plan and will have labs checked every 3 months.  Mixed hyperlipidemia. Cardiovascular risk and specific lipid/LDL goals reviewed.  We discussed several lifestyle modifications today and Heather Hayes will continue to work on diet, exercise and weight loss efforts. Orders and follow up as documented in patient record. Heather Hayes will continue to follow the Category 2 meal plan. Will monitor labs.  Counseling Intensive lifestyle modifications are the first line treatment for this issue. . Dietary changes: Increase  soluble fiber. Decrease simple carbohydrates. . Exercise changes: Moderate to vigorous-intensity aerobic activity 150 minutes per week if tolerated. . Lipid-lowering medications: see documented in medical record.  Vitamin D deficiency. Low Vitamin D level contributes to fatigue and are associated with obesity, breast, and colon cancer. She agrees to continue to take OTC Vitamin D3 as directed and will follow-up for routine testing of Vitamin D every 3 months.  Class 1 obesity with serious comorbidity and body mass index (BMI) of 30.0 to 30.9 in adult, unspecified obesity type - BMI greater than  30 at start.  Heather Hayes is currently in the action stage of change. As such, her goal is to continue with weight loss efforts. She has agreed to the Category 2 Plan.   Exercise goals: Heather Hayes will continue her current exercise regimen.   Behavioral modification strategies: increasing lean protein intake, increasing water intake, meal planning and cooking strategies, travel eating strategies and celebration eating strategies.  Heather Hayes has agreed to follow-up with our clinic in 2-3 weeks. She was informed of the importance of frequent follow-up visits to maximize her success with intensive lifestyle modifications for her multiple health conditions.   Objective:   Blood pressure 127/85, pulse 63, temperature 97.7 F (36.5 C), temperature source Oral, height 5\' 3"  (1.6 m), weight 164 lb (74.4 kg), SpO2 98 %. Body mass index is 29.05 kg/m.  General: Cooperative, alert, well developed, in no acute distress. HEENT: Conjunctivae and lids unremarkable. Cardiovascular: Regular rhythm.  Lungs: Normal work of breathing. Neurologic: No focal deficits.   Lab Results  Component Value Date   CREATININE 0.74 09/05/2019   BUN 8 09/05/2019   NA 142 09/05/2019   K 4.5 09/05/2019   CL 103 09/05/2019   CO2 24 09/05/2019   Lab Results  Component Value Date   ALT 16 09/05/2019   AST 19 09/05/2019   ALKPHOS 90 09/05/2019   BILITOT 0.3 09/05/2019   Lab Results  Component Value Date   HGBA1C 5.5 01/22/2020   HGBA1C 5.5 09/05/2019   HGBA1C 5.4 05/15/2019   Lab Results  Component Value Date   INSULIN 7.5 01/22/2020   INSULIN 8.3 09/05/2019   INSULIN 14.9 05/15/2019   Lab Results  Component Value Date   TSH 1.560 05/15/2019   Lab Results  Component Value Date   CHOL 202 (H) 01/22/2020   HDL 66 01/22/2020   LDLCALC 112 (H) 01/22/2020   TRIG 138 01/22/2020   CHOLHDL 3.1 09/05/2019   Lab Results  Component Value Date   WBC 5.4 09/05/2019   HGB 13.6 09/05/2019   HCT 41.1 09/05/2019   MCV 92  09/05/2019   PLT 235 09/05/2019   No results found for: IRON, TIBC, FERRITIN  Attestation Statements:   Reviewed by clinician on day of visit: allergies, medications, problem list, medical history, surgical history, family history, social history, and previous encounter notes.  Time spent on visit including pre-visit chart review and post-visit charting and care was 27 minutes.   I, Michaelene Song, am acting as Location manager for [employer], NP-C   I have reviewed the above documentation for accuracy and completeness, and I agree with the above. -  Esaw Grandchild, NP

## 2020-03-05 ENCOUNTER — Ambulatory Visit (INDEPENDENT_AMBULATORY_CARE_PROVIDER_SITE_OTHER): Payer: BC Managed Care – PPO | Admitting: Adult Health

## 2020-03-11 ENCOUNTER — Other Ambulatory Visit: Payer: Self-pay

## 2020-03-11 ENCOUNTER — Ambulatory Visit (INDEPENDENT_AMBULATORY_CARE_PROVIDER_SITE_OTHER): Payer: BC Managed Care – PPO | Admitting: Adult Health

## 2020-03-11 ENCOUNTER — Encounter (INDEPENDENT_AMBULATORY_CARE_PROVIDER_SITE_OTHER): Payer: Self-pay | Admitting: Adult Health

## 2020-03-11 VITALS — BP 117/75 | HR 67 | Temp 98.0°F | Ht 63.0 in | Wt 165.0 lb

## 2020-03-11 DIAGNOSIS — E782 Mixed hyperlipidemia: Secondary | ICD-10-CM | POA: Diagnosis not present

## 2020-03-11 DIAGNOSIS — E669 Obesity, unspecified: Secondary | ICD-10-CM | POA: Diagnosis not present

## 2020-03-11 DIAGNOSIS — Z6830 Body mass index (BMI) 30.0-30.9, adult: Secondary | ICD-10-CM | POA: Diagnosis not present

## 2020-03-11 DIAGNOSIS — E559 Vitamin D deficiency, unspecified: Secondary | ICD-10-CM | POA: Diagnosis not present

## 2020-03-11 NOTE — Progress Notes (Signed)
Chief Complaint:   OBESITY Heather Hayes is here to discuss her progress with her obesity treatment plan along with follow-up of her obesity related diagnoses. Heather Hayes is on the Category 2 Plan and states she is following her eating plan approximately 60-65% of the time. Heather Hayes states she is exercising 0 minutes 0 times per week.  Today's visit was #: 27 Starting weight: 191 lbs Starting date: 05/15/2019 Today's weight: 165 lbs Today's date: 03/11/2020 Total lbs lost to date: 26 Total lbs lost since last in-office visit: 0  Interim History: Heather Hayes reports increased stress related to meeting year-end goals for fiscal year at work and recent spike in [place] cases. She has been eating off plan more frequently due to celebrating her husband's birthday and food that is brought into the office.  Subjective:   Vitamin D deficiency. Heather Hayes is on OTC Vitamin D supplementation. No nausea, vomiting, or muscle weakness.    Ref. Range 01/22/2020 12:22  Vitamin D, 25-Hydroxy Latest Ref Range: 30.0 - 100.0 ng/mL 51.7   Mixed hyperlipidemia. Heather Hayes is not on statin therapy.   Lab Results  Component Value Date   CHOL 202 (H) 01/22/2020   HDL 66 01/22/2020   LDLCALC 112 (H) 01/22/2020   TRIG 138 01/22/2020   CHOLHDL 3.1 09/05/2019   Lab Results  Component Value Date   ALT 16 09/05/2019   AST 19 09/05/2019   ALKPHOS 90 09/05/2019   BILITOT 0.3 09/05/2019   The 10-year ASCVD risk score Heather Hayes DC Jr., et al., 2013) is: 2.2%   Values used to calculate the score:     Age: 59 years     Sex: Female     Is Non-Hispanic African American: No     Diabetic: No     Tobacco smoker: No     Systolic Blood Pressure: 250 mmHg     Is BP treated: No     HDL Cholesterol: 66 mg/dL     Total Cholesterol: 202 mg/dL  Assessment/Plan:   Vitamin D deficiency. Low Vitamin D level contributes to fatigue and are associated with obesity, breast, and colon cancer. She agrees to continue to take OTC Vitamin D as  directed and will follow-up for routine testing of Vitamin D every 3 months.  Mixed hyperlipidemia. Cardiovascular risk and specific lipid/LDL goals reviewed.  We discussed several lifestyle modifications today and Heather Hayes will continue to work on diet, exercise and weight loss efforts. Orders and follow up as documented in patient record. She will decrease her intake of saturated fat. Labs will be checked every 3 months.  Counseling Intensive lifestyle modifications are the first line treatment for this issue. . Dietary changes: Increase soluble fiber. Decrease simple carbohydrates. . Exercise changes: Moderate to vigorous-intensity aerobic activity 150 minutes per week if tolerated. . Lipid-lowering medications: see documented in medical record.  Class 1 obesity with serious comorbidity and body mass index (BMI) of 30.0 to 30.9 in adult, unspecified obesity type - BMI was greater than 30 at the start of the program.  Heather Hayes is currently in the action stage of change. As such, her goal is to continue with weight loss efforts. She has agreed to change plans and will now keep a daily food journal and adhering to recommended goals of 1000-1100 calories and 75 grams of protein daily.   Exercise goals: No exercise has been prescribed at this time.  Behavioral modification strategies: increasing lean protein intake, decreasing simple carbohydrates, meal planning and cooking strategies, better snacking  choices, planning for success and keeping a strict food journal.  Heather Hayes has agreed to follow-up with our clinic in 2-3 weeks. She was informed of the importance of frequent follow-up visits to maximize her success with intensive lifestyle modifications for her multiple health conditions.   Objective:   Blood pressure 117/75, pulse 67, temperature 98 F (36.7 C), temperature source Oral, height 5\' 3"  (1.6 m), weight 165 lb (74.8 kg), SpO2 99 %. Body mass index is 29.23 kg/m.  General: Cooperative, alert,  well developed, in no acute distress. HEENT: Conjunctivae and lids unremarkable. Cardiovascular: Regular rhythm.  Lungs: Normal work of breathing. Neurologic: No focal deficits.   Lab Results  Component Value Date   CREATININE 0.74 09/05/2019   BUN 8 09/05/2019   NA 142 09/05/2019   K 4.5 09/05/2019   CL 103 09/05/2019   CO2 24 09/05/2019   Lab Results  Component Value Date   ALT 16 09/05/2019   AST 19 09/05/2019   ALKPHOS 90 09/05/2019   BILITOT 0.3 09/05/2019   Lab Results  Component Value Date   HGBA1C 5.5 01/22/2020   HGBA1C 5.5 09/05/2019   HGBA1C 5.4 05/15/2019   Lab Results  Component Value Date   INSULIN 7.5 01/22/2020   INSULIN 8.3 09/05/2019   INSULIN 14.9 05/15/2019   Lab Results  Component Value Date   TSH 1.560 05/15/2019   Lab Results  Component Value Date   CHOL 202 (H) 01/22/2020   HDL 66 01/22/2020   LDLCALC 112 (H) 01/22/2020   TRIG 138 01/22/2020   CHOLHDL 3.1 09/05/2019   Lab Results  Component Value Date   WBC 5.4 09/05/2019   HGB 13.6 09/05/2019   HCT 41.1 09/05/2019   MCV 92 09/05/2019   PLT 235 09/05/2019   No results found for: IRON, TIBC, FERRITIN  Attestation Statements:   Reviewed by clinician on day of visit: allergies, medications, problem list, medical history, surgical history, family history, social history, and previous encounter notes.  Time spent on visit including pre-visit chart review and post-visit charting and care was 27 minutes.   I, Michaelene Song, am acting as Location manager for [employer], NP-C   I have reviewed the above documentation for accuracy and completeness, and I agree with the above. -  Esaw Grandchild, NP

## 2020-03-20 ENCOUNTER — Encounter: Payer: Self-pay | Admitting: Nurse Practitioner

## 2020-03-27 ENCOUNTER — Encounter (INDEPENDENT_AMBULATORY_CARE_PROVIDER_SITE_OTHER): Payer: Self-pay | Admitting: Adult Health

## 2020-03-27 ENCOUNTER — Ambulatory Visit (INDEPENDENT_AMBULATORY_CARE_PROVIDER_SITE_OTHER): Payer: BC Managed Care – PPO | Admitting: Adult Health

## 2020-03-27 ENCOUNTER — Other Ambulatory Visit: Payer: Self-pay

## 2020-03-27 VITALS — BP 129/85 | HR 65 | Temp 98.0°F | Ht 63.0 in | Wt 164.0 lb

## 2020-03-27 DIAGNOSIS — Z6830 Body mass index (BMI) 30.0-30.9, adult: Secondary | ICD-10-CM

## 2020-03-27 DIAGNOSIS — E782 Mixed hyperlipidemia: Secondary | ICD-10-CM

## 2020-03-27 DIAGNOSIS — E8881 Metabolic syndrome: Secondary | ICD-10-CM | POA: Diagnosis not present

## 2020-03-27 DIAGNOSIS — E559 Vitamin D deficiency, unspecified: Secondary | ICD-10-CM

## 2020-03-27 DIAGNOSIS — E669 Obesity, unspecified: Secondary | ICD-10-CM

## 2020-03-27 NOTE — Progress Notes (Signed)
Chief Complaint:   OBESITY CARRA Hayes is here to discuss her progress with her obesity treatment plan along with follow-up of her obesity related diagnoses. [name] is keeping a food journal and adhering to recommended goals of 1000-1100 calories and 75 grams of protein and states she is following her eating plan approximately 80% of the time. [name] states she is painting for exercise.   Today's visit was #: 70 Starting weight: 191 lbs Starting date: 05/15/2019 Today's weight: 164 lbs Today's date: 03/27/2020 Total lbs lost to date: 27 Total lbs lost since last in-office visit: 1  Interim History: Heather Hayes painted the ceiling in her great room last weekend while preparing for a new sectional to be delivered. She estimates to hit her calorie and protein goals 80% of the time while tracking 100% of the time.  Subjective:   Insulin resistance. Heather Hayes has a diagnosis of insulin resistance based on her elevated fasting insulin level >5. She continues to work on diet and exercise to decrease her risk of diabetes. Heather Hayes is not on metformin and denies polyphagia.  Lab Results  Component Value Date   INSULIN 7.5 01/22/2020   INSULIN 8.3 09/05/2019   INSULIN 14.9 05/15/2019   Lab Results  Component Value Date   HGBA1C 5.5 01/22/2020   Mixed hyperlipidemia. Heather Hayes is not on a statin. She denies cardiac symptoms.  Lab Results  Component Value Date   CHOL 202 (H) 01/22/2020   HDL 66 01/22/2020   LDLCALC 112 (H) 01/22/2020   TRIG 138 01/22/2020   CHOLHDL 3.1 09/05/2019   Lab Results  Component Value Date   ALT 16 09/05/2019   AST 19 09/05/2019   ALKPHOS 90 09/05/2019   BILITOT 0.3 09/05/2019   The 10-year ASCVD risk score [name], et al., 2013) is: 2.7%   Values used to calculate the score:     Age: 59 years     Sex: Female     Is Non-Hispanic African American: No     Diabetic: No     Tobacco smoker: No     Systolic Blood Pressure: 366 mmHg     Is BP treated: No     HDL  Cholesterol: 66 mg/dL     Total Cholesterol: 202 mg/dL  Vitamin D deficiency. Heather Hayes is on OTC Vitamin D3 5,000 IU daily.   Ref. Range 01/22/2020 12:22  Vitamin D, 25-Hydroxy Latest Ref Range: 30.0 - 100.0 ng/mL 51.7   Assessment/Plan:   Insulin resistance. Heather Hayes will continue to work on weight loss, exercise, and decreasing simple carbohydrates to help decrease the risk of diabetes. Heather Hayes agreed to follow-up with [name] as directed to closely monitor her progress. She will continue healthy eating. Labs will be checked every 3 months.  Mixed hyperlipidemia. Cardiovascular risk and specific lipid/LDL goals reviewed.  We discussed several lifestyle modifications today and Heather Hayes will continue to work on diet, exercise and weight loss efforts. Orders and follow up as documented in patient record. Heather Hayes will limit her intake of saturated fat. Labs will be checked every 3 months.   Counseling Intensive lifestyle modifications are the first line treatment for this issue.  Dietary changes: Increase soluble fiber. Decrease simple carbohydrates.  Exercise changes: Moderate to vigorous-intensity aerobic activity 150 minutes per week if tolerated.  Lipid-lowering medications: see documented in medical record.  Vitamin D deficiency. Low Vitamin D level contributes to fatigue and are associated with obesity, breast, and colon cancer. She agrees to continue to take her  OTC Vitamin D supplementation and will follow-up for routine testing of Vitamin D every 3 months.  Class 1 obesity with serious comorbidity and body mass index (BMI) of 30.0 to 30.9 in adult, unspecified obesity type - BMI greater than 30 at the start of program.  Heather Hayes is currently in the action stage of change. As such, her goal is to continue with weight loss efforts. She has agreed to keeping a food journal and adhering to recommended goals of 1000-1100 calories and 75 grams of protein daily.   Exercise goals: Heather Hayes will continue her current  exercise regimen.   Behavioral modification strategies: increasing lean protein intake, increasing water intake, meal planning and cooking strategies, planning for success and keeping a strict food journal.  Heather Hayes has agreed to follow-up with our clinic in 2 weeks. She was informed of the importance of frequent follow-up visits to maximize her success with intensive lifestyle modifications for her multiple health conditions.   Objective:   Blood pressure 129/85, pulse 65, temperature 98 F (36.7 C), temperature source Oral, height 5\' 3"  (1.6 m), weight 164 lb (74.4 kg), SpO2 99 %. Body mass index is 29.05 kg/m.  General: Cooperative, alert, well developed, in no acute distress. HEENT: Conjunctivae and lids unremarkable. Cardiovascular: Regular rhythm.  Lungs: Normal work of breathing. Neurologic: No focal deficits.   Lab Results  Component Value Date   CREATININE 0.74 09/05/2019   BUN 8 09/05/2019   NA 142 09/05/2019   K 4.5 09/05/2019   CL 103 09/05/2019   CO2 24 09/05/2019   Lab Results  Component Value Date   ALT 16 09/05/2019   AST 19 09/05/2019   ALKPHOS 90 09/05/2019   BILITOT 0.3 09/05/2019   Lab Results  Component Value Date   HGBA1C 5.5 01/22/2020   HGBA1C 5.5 09/05/2019   HGBA1C 5.4 05/15/2019   Lab Results  Component Value Date   INSULIN 7.5 01/22/2020   INSULIN 8.3 09/05/2019   INSULIN 14.9 05/15/2019   Lab Results  Component Value Date   TSH 1.560 05/15/2019   Lab Results  Component Value Date   CHOL 202 (H) 01/22/2020   HDL 66 01/22/2020   LDLCALC 112 (H) 01/22/2020   TRIG 138 01/22/2020   CHOLHDL 3.1 09/05/2019   Lab Results  Component Value Date   WBC 5.4 09/05/2019   HGB 13.6 09/05/2019   HCT 41.1 09/05/2019   MCV 92 09/05/2019   PLT 235 09/05/2019   No results found for: IRON, TIBC, FERRITIN  Attestation Statements:   Reviewed by clinician on day of visit: allergies, medications, problem list, medical history, surgical history,  family history, social history, and previous encounter notes.  Time spent on visit including pre-visit chart review and post-visit charting and care was 27 minutes.   I, Michaelene Song, am acting as Location manager for [employer], NP-C   I have reviewed the above documentation for accuracy and completeness, and I agree with the above. -  Esaw Grandchild, NP

## 2020-04-10 ENCOUNTER — Ambulatory Visit (INDEPENDENT_AMBULATORY_CARE_PROVIDER_SITE_OTHER): Payer: BC Managed Care – PPO | Admitting: Adult Health

## 2020-04-17 ENCOUNTER — Encounter (INDEPENDENT_AMBULATORY_CARE_PROVIDER_SITE_OTHER): Payer: Self-pay | Admitting: Adult Health

## 2020-04-17 ENCOUNTER — Ambulatory Visit (INDEPENDENT_AMBULATORY_CARE_PROVIDER_SITE_OTHER): Payer: BC Managed Care – PPO | Admitting: Adult Health

## 2020-04-17 ENCOUNTER — Other Ambulatory Visit: Payer: Self-pay

## 2020-04-17 VITALS — BP 127/83 | HR 65 | Temp 98.0°F | Ht 63.0 in | Wt 164.0 lb

## 2020-04-17 DIAGNOSIS — E669 Obesity, unspecified: Secondary | ICD-10-CM | POA: Diagnosis not present

## 2020-04-17 DIAGNOSIS — Z6830 Body mass index (BMI) 30.0-30.9, adult: Secondary | ICD-10-CM

## 2020-04-17 DIAGNOSIS — E8881 Metabolic syndrome: Secondary | ICD-10-CM

## 2020-04-17 DIAGNOSIS — E559 Vitamin D deficiency, unspecified: Secondary | ICD-10-CM | POA: Diagnosis not present

## 2020-04-21 ENCOUNTER — Encounter: Payer: Self-pay | Admitting: Nurse Practitioner

## 2020-04-21 DIAGNOSIS — M5136 Other intervertebral disc degeneration, lumbar region: Secondary | ICD-10-CM | POA: Diagnosis not present

## 2020-04-21 DIAGNOSIS — M545 Low back pain: Secondary | ICD-10-CM | POA: Diagnosis not present

## 2020-04-22 NOTE — Progress Notes (Signed)
Chief Complaint:   OBESITY Heather Hayes is here to discuss her progress with her obesity treatment plan along with follow-up of her obesity related diagnoses. Heather Hayes is on the Category 2 Plan and states she is following her eating plan approximately 80-85% of the time. Heather Hayes states she is exercising 0 minutes 0 times per week.  Today's visit was #: 18 Starting weight: 191 lbs Starting date: 05/15/2019 Today's weight: 164 lbs Today's date: 04/17/2020 Total lbs lost to date: 27 Total lbs lost since last in-office visit: 0  Interim History: Heather Hayes reports not tracking as accurately as she needed to and strayed from the journaling plan. She has converted back to following the Category 2 meal plan and really prefers the structure of a category meal plan. When eating on plan, she denies polyphagia or excessive cravings.  Subjective:   Insulin resistance. [name] has a diagnosis of insulin resistance based on her elevated fasting insulin level >5. She continues to work on diet and exercise to decrease her risk of diabetes. 01/22/2020 A1c level 5.5 with an insulin level of 7.5, improved from initial check on 09/05/2019 of 8.3. Heather Hayes is not on metformin.  Lab Results  Component Value Date   INSULIN 7.5 01/22/2020   INSULIN 8.3 09/05/2019   INSULIN 14.9 05/15/2019   Lab Results  Component Value Date   HGBA1C 5.5 01/22/2020   Vitamin D deficiency. Heather Hayes is on OTC Vitamin D3 5,000 IU once daily. Last Vitamin D level at goal at 51.7 on 01/22/2020.   Ref. Range 01/22/2020 12:22  Vitamin D, 25-Hydroxy Latest Ref Range: 30.0 - 100.0 ng/mL 51.7   Assessment/Plan:   Insulin resistance. Heather Hayes will continue to work on weight loss, exercise, and decreasing simple carbohydrates to help decrease the risk of diabetes. Heather Hayes agreed to follow-up with [name] as directed to closely monitor her progress. She will continue healthier eating. Labs will be checked every 3 months.   Vitamin D deficiency. Low Vitamin D  level contributes to fatigue and are associated with obesity, breast, and colon cancer. She agrees to continue to take OTC Vitamin D supplementation as directed and will follow-up for routine testing of Vitamin D, at least 2-3 times per year to avoid over-replacement.  Class 1 obesity with serious comorbidity and body mass index (BMI) of 30.0 to 30.9 in adult, unspecified obesity type - BMI greater than 30 at the start of program.  Heather Hayes is currently in the action stage of change. As such, her goal is to continue with weight loss efforts. She has agreed to the Category 2 Plan.   Exercise goals: No exercise has been prescribed at this time.  Behavioral modification strategies: increasing lean protein intake, increasing water intake, meal planning and cooking strategies and planning for success.  Heather Hayes has agreed to follow-up with our clinic in 2 weeks. She was informed of the importance of frequent follow-up visits to maximize her success with intensive lifestyle modifications for her multiple health conditions.   Objective:   Blood pressure 127/83, pulse 65, temperature 98 F (36.7 C), height 5\' 3"  (1.6 m), weight 164 lb (74.4 kg), SpO2 100 %. Body mass index is 29.05 kg/m.  General: Cooperative, alert, well developed, in no acute distress. HEENT: Conjunctivae and lids unremarkable. Cardiovascular: Regular rhythm.  Lungs: Normal work of breathing. Neurologic: No focal deficits.   Lab Results  Component Value Date   CREATININE 0.74 09/05/2019   BUN 8 09/05/2019   NA 142 09/05/2019   K  4.5 09/05/2019   CL 103 09/05/2019   CO2 24 09/05/2019   Lab Results  Component Value Date   ALT 16 09/05/2019   AST 19 09/05/2019   ALKPHOS 90 09/05/2019   BILITOT 0.3 09/05/2019   Lab Results  Component Value Date   HGBA1C 5.5 01/22/2020   HGBA1C 5.5 09/05/2019   HGBA1C 5.4 05/15/2019   Lab Results  Component Value Date   INSULIN 7.5 01/22/2020   INSULIN 8.3 09/05/2019   INSULIN 14.9  05/15/2019   Lab Results  Component Value Date   TSH 1.560 05/15/2019   Lab Results  Component Value Date   CHOL 202 (H) 01/22/2020   HDL 66 01/22/2020   LDLCALC 112 (H) 01/22/2020   TRIG 138 01/22/2020   CHOLHDL 3.1 09/05/2019   Lab Results  Component Value Date   WBC 5.4 09/05/2019   HGB 13.6 09/05/2019   HCT 41.1 09/05/2019   MCV 92 09/05/2019   PLT 235 09/05/2019   No results found for: IRON, TIBC, FERRITIN  Attestation Statements:   Reviewed by clinician on day of visit: allergies, medications, problem list, medical history, surgical history, family history, social history, and previous encounter notes.  Time spent on visit including pre-visit chart review and post-visit charting and care was 29 minutes.   I, Michaelene Song, am acting as Location manager for [employer], NP-C   I have reviewed the above documentation for accuracy and completeness, and I agree with the above. -  Esaw Grandchild, NP

## 2020-04-23 ENCOUNTER — Other Ambulatory Visit: Payer: Self-pay | Admitting: Nurse Practitioner

## 2020-04-23 DIAGNOSIS — Z170 Estrogen receptor positive status [ER+]: Secondary | ICD-10-CM

## 2020-04-23 DIAGNOSIS — C50411 Malignant neoplasm of upper-outer quadrant of right female breast: Secondary | ICD-10-CM

## 2020-04-23 NOTE — Progress Notes (Signed)
[place]   [phone] Fax:([phone]) [phone]   Clinic Follow up Note   Patient Care Team: Molli Posey, MD as PCP - General (Obstetrics and Gynecology) Mauro Kaufmann, RN as Oncology Nurse Navigator Rockwell Germany, RN as Oncology Nurse Navigator Excell Seltzer, MD (Inactive) as Consulting Physician (General Surgery) Truitt Merle, MD as Consulting Physician (Hematology) Gery Pray, MD as Consulting Physician (Radiation Oncology) Alla Feeling, NP as Nurse Practitioner (Nurse Practitioner)  Date of Service:  04/25/2020  CHIEF COMPLAINT: F/u of right breast cancer  SUMMARY OF ONCOLOGIC HISTORY: Oncology History Overview Note  Cancer Staging Malignant neoplasm of upper-outer quadrant of right breast in female, estrogen receptor positive ([place]) Staging form: Breast, AJCC 8th Edition - Clinical stage from 10/02/2018: Stage IA (cT1b, cN0, cM0, G2, ER+, PR+, HER2-) - Signed by Truitt Merle, MD on 10/10/2018 - Pathologic stage from 10/27/2018: Stage IA (pT1b, pN0, cM0, G1, ER+, PR+, HER2-) - Signed by Truitt Merle, MD on 02/26/2019     Malignant neoplasm of upper-outer quadrant of right breast in female, estrogen receptor positive ([place])  09/27/2018 Mammogram   [facility] Mammogram 09/27/18  IMPRESSION: 1. Suspicious right breast mass measures 7 x 7 x 6 mm with associated vascularity at the 12 o'clock position 7 cm from the nipple. 2. No suspicious right axillary lymphadenopathy.   10/02/2018 Cancer Staging   Staging form: Breast, AJCC 8th Edition - Clinical stage from 10/02/2018: Stage IA (cT1b, cN0, cM0, G2, ER+, PR+, HER2-) - Signed by Truitt Merle, MD on 10/10/2018   10/02/2018 Initial Biopsy   Diagnosis 10/02/18 Breast, right, needle core biopsy, 12 o'clock - INVASIVE DUCTAL CARCINOMA.   10/02/2018 Receptors her2   Results: IMMUNOHISTOCHEMICAL AND MORPHOMETRIC ANALYSIS PERFORMED MANUALLY The tumor cells are NEGATIVE for Her2 (1+). Estrogen Receptor: 95%,  POSITIVE, STRONG STAINING INTENSITY Progesterone Receptor: 95%, POSITIVE, STRONG STAINING INTENSITY Proliferation Marker Ki67: 5%   10/04/2018 Initial Diagnosis   Malignant neoplasm of upper-outer quadrant of right breast in female, estrogen receptor positive ([place])   10/27/2018 Surgery   RIGHT BREAST LUMPECTOMY WITH RADIOACTIVE SEED AND SENTINEL LYMPH NODE BIOPSY by Dr Excell Seltzer   10/27/2018 Pathology Results   Diagnosis 10/27/18 1. Breast, lumpectomy, Right - INVASIVE DUCTAL CARCINOMA WITH CALCIFICATIONS, GRADE I/III, SPANNING 0.7 CM. - THE SURGICAL RESECTION MARGIN ARE NEGATIVE FOR CARCINOMA. - SEE ONCOLOGY TABLE BELOW. 2. Lymph node, sentinel, biopsy, Right - THERE IS NO EVIDENCE OF CARCINOMA IN 1 OF 1 LYMPH NODE (0/1). 3. Lymph node, sentinel, biopsy, Right - THERE IS NO EVIDENCE OF CARCINOMA IN 1 OF 1 LYMPH NODE (0/1).    - 01/29/2019 Radiation Therapy   Radiation therapy at [facility] on 01/29/19.    10/27/2018 Cancer Staging   Staging form: Breast, AJCC 8th Edition - Pathologic stage from 10/27/2018: Stage IA (pT1b, pN0, cM0, G1, ER+, PR+, HER2-) - Signed by Truitt Merle, MD on 02/26/2019   03/2019 -  Anti-estrogen oral therapy   Anastrozole 72m daily starting in 03/2019    07/09/2019 Survivorship   SCP delivered by LCira Rue NP       CURRENT THERAPY:  Anastrozole 129mdaily starting8/2020  INTERVAL HISTORY:  Heather Hayes here for a follow up of right breast cancer. She was last seen by me 6 months ago. She presents to the clinic alone. She notes she was on Prednisone this week due to bulging disc. She has 1 pill prednisone left. This was exacerbated by recently painting a ceiling. She notes she is  tolerating anastrozole well. She has mild lower back and hip arthritis along with shoulder and neck "popping". She also notes hot flashes at night is manageable. She notes she has gained a few pounds due to prednisone this week. She continues to f/u with [facility].     REVIEW OF SYSTEMS:   Constitutional: Denies fevers, chills or abnormal weight loss Eyes: Denies blurriness of vision Ears, nose, mouth, throat, and face: Denies mucositis or sore throat Respiratory: Denies cough, dyspnea or wheezes Cardiovascular: Denies palpitation, chest discomfort or lower extremity swelling Gastrointestinal:  Denies nausea, heartburn or change in bowel habits Skin: Denies abnormal skin rashes MSK: (+) Lower back and hip arthritis and mild joint pain in shoulder and neck Lymphatics: Denies new lymphadenopathy or easy bruising Neurological:Denies numbness, tingling or new weaknesses Behavioral/Psych: Mood is stable, no new changes  All other systems were reviewed with the patient and are negative.  MEDICAL HISTORY:  Past Medical History:  Diagnosis Date  . Anxiety   . Arthritis   . Back pain   . Bursitis   . Cancer [facility])    right breast cancer 09-2018  . Constipation   . GERD (gastroesophageal reflux disease)    some heart burn from Arimidex  . History of radiation therapy    completed 01-29-2019  . Hyperlipidemia   . Joint pain   . Personal history of radiation therapy   . Rheumatic fever   . Swallowing difficulty     SURGICAL HISTORY: Past Surgical History:  Procedure Laterality Date  . BREAST LUMPECTOMY Right 10/27/2018  . BREAST LUMPECTOMY WITH RADIOACTIVE SEED AND SENTINEL LYMPH NODE BIOPSY Right 10/27/2018   Procedure: RIGHT BREAST LUMPECTOMY WITH RADIOACTIVE SEED AND SENTINEL LYMPH NODE BIOPSY;  Surgeon: Excell Seltzer, MD;  Location: [facility];  Service: General;  Laterality: Right;  . CERVICAL SPINE SURGERY  2009  . COLONOSCOPY    . ENDOMETRIAL ABLATION    . POLYPECTOMY    . TUBAL LIGATION    . tube ligation       I have reviewed the social history and family history with the patient and they are unchanged from previous note.  ALLERGIES:  is allergic to compazine [prochlorperazine  edisylate].  MEDICATIONS:  Current Outpatient Medications  Medication Sig Dispense Refill  . anastrozole (ARIMIDEX) 1 MG tablet Take 1 tablet (1 mg total) by mouth daily. 90 tablet 3  . Cholecalciferol (VITAMIN D-3) 125 MCG (5000 UT) TABS Take 1 tablet by mouth daily.    . clonazePAM (KLONOPIN) 0.5 MG tablet Take 0.5 mg by mouth 2 (two) times daily as needed for anxiety.    . meloxicam (MOBIC) 15 MG tablet Take 15 mg by mouth daily.     No current facility-administered medications for this visit.    PHYSICAL EXAMINATION: ECOG PERFORMANCE STATUS: 0 - Asymptomatic  Vitals:   04/25/20 0932  BP: (!) 143/84  Pulse: 63  Resp: 18  Temp: 97.9 F (36.6 C)  SpO2: 100%   Filed Weights   04/25/20 0932  Weight: 170 lb 4.8 oz (77.2 kg)    GENERAL:alert, no distress and comfortable SKIN: skin color, texture, turgor are normal, no rashes or significant lesions EYES: normal, Conjunctiva are pink and non-injected, sclera clear  NECK: supple, thyroid normal size, non-tender, without nodularity LYMPH:  no palpable lymphadenopathy in the cervical, axillary  LUNGS: clear to auscultation and percussion with normal breathing effort HEART: regular rate & rhythm and no murmurs and no  lower extremity edema ABDOMEN:abdomen soft, non-tender and normal bowel sounds Musculoskeletal:no cyanosis of digits and no clubbing  NEURO: alert & oriented x 3 with fluent speech, no focal motor/sensory deficits BREAST: s/p right lumpectomy: Surgical incision healed well with minimal scar tissue, there is a 1cm lump at 12:00 position which has been stable. No other palpable mass, nodules or adenopathy bilaterally.   LABORATORY DATA:  I have reviewed the data as listed CBC Latest Ref Rng & Units 04/25/2020 09/05/2019 05/15/2019  WBC 4.0 - 10.5 K/uL 9.2 5.4 5.4  Hemoglobin 12.0 - 15.0 g/dL 13.5 13.6 13.3  Hematocrit 36 - 46 % 41.5 41.1 41.0  Platelets 150 - 400 K/uL 163 235 257     CMP Latest Ref Rng & Units  04/25/2020 09/05/2019 05/15/2019  Glucose 70 - 99 mg/dL 78 78 98  BUN 6 - 20 mg/dL '19 8 12  ' Creatinine 0.44 - 1.00 mg/dL 0.73 0.74 0.64  Sodium 135 - 145 mmol/L 138 142 139  Potassium 3.5 - 5.1 mmol/L 4.2 4.5 4.5  Chloride 98 - 111 mmol/L 104 103 100  CO2 22 - 32 mmol/L '28 24 23  ' Calcium 8.9 - 10.3 mg/dL 9.9 10.0 9.8  Total Protein 6.5 - 8.1 g/dL 7.9 7.2 7.5  Total Bilirubin 0.3 - 1.2 mg/dL 0.4 0.3 0.4  Alkaline Phos 38 - 126 U/L 90 90 102  AST 15 - 41 U/L '16 19 15  ' ALT 0 - 44 U/L '15 16 11      ' RADIOGRAPHIC STUDIES: I have personally reviewed the radiological images as listed and agreed with the findings in the report. No results found.   ASSESSMENT & PLAN:  Heather Hayes is a 59 y.o. female with    1.Malignant neoplasm of upper-outer quadrant of right breast, StageIA,p(T1b,N0,M0), ER/PR+,HER2-,Grade II -She was diagnosed in 10/2018. She is S/p right lumpectomy and radiation.  -Given low grade T1b ER/PR strongly positive disease, I did not recommend Oncotypeor chemo. -She started antiestrogen therapy with Anastrozole in 03/2019.Overall tolerable with mild hot flashers and mild exacerbation of arthritis, which are both manageable.  -She is clinically doing well. Lab reviewed, her CBC and CMP are within normal limits. Her physical exam and her 10/2019 mammogram were unremarkable. There is no clinical concern for recurrence. -Continue surveillance. Next Mammogram in 09/2020 -Continue anastrozole  -F/u in 6 months with NP Lacie    2. Bone Health  -Her 10/25/17 DEXA was normal with T-score 0.1 of b/l hips.  -I discussed Anastrozole can reduce her bone density. Will repeat DEXA in 09/2020.    3. Overweight  -She is concerned with obesity and health complications from it.  -Continue to f/u with Dr. Juleen China at [facility].   4.She had genetic testingin 2019 with Dr. Lessie Dings was negative.   5. Arthritis and bursitis of her  hips -Has been mildly exacerbated by anastrozole along with joint stiffness. Still manageable on Meloxicam and Voltaren -Her lower back bulging disc was recently exacerbated by activity. She was treated with prednisone this week and will complete today or tomorrow.    PLAN: -She is clinically doing well  -Continue Anastrozole, refilled today  -DEXA in 09/2020 at her GYN office  -Lab and f/u with NP Lacie in 6 months    No problem-specific Assessment & Plan notes found for this encounter.   No orders of the defined types were placed in this encounter.  All questions were answered. The patient knows to call the clinic  with any problems, questions or concerns. No barriers to learning was detected. The total time spent in the appointment was 25 minutes.     Truitt Merle, MD 04/25/2020   I, Joslyn Devon, am acting as scribe for Truitt Merle, MD.   I have reviewed the above documentation for accuracy and completeness, and I agree with the above.

## 2020-04-25 ENCOUNTER — Encounter: Payer: Self-pay | Admitting: Hematology

## 2020-04-25 ENCOUNTER — Other Ambulatory Visit: Payer: Self-pay

## 2020-04-25 ENCOUNTER — Telehealth: Payer: Self-pay | Admitting: Hematology

## 2020-04-25 ENCOUNTER — Inpatient Hospital Stay: Payer: BC Managed Care – PPO | Attending: Hematology | Admitting: Hematology

## 2020-04-25 ENCOUNTER — Inpatient Hospital Stay: Payer: BC Managed Care – PPO

## 2020-04-25 VITALS — BP 143/84 | HR 63 | Temp 97.9°F | Resp 18 | Ht 63.0 in | Wt 170.3 lb

## 2020-04-25 DIAGNOSIS — C50411 Malignant neoplasm of upper-outer quadrant of right female breast: Secondary | ICD-10-CM | POA: Diagnosis not present

## 2020-04-25 DIAGNOSIS — Z170 Estrogen receptor positive status [ER+]: Secondary | ICD-10-CM | POA: Insufficient documentation

## 2020-04-25 DIAGNOSIS — Z79811 Long term (current) use of aromatase inhibitors: Secondary | ICD-10-CM | POA: Diagnosis not present

## 2020-04-25 LAB — CBC WITH DIFFERENTIAL (CANCER CENTER ONLY)
Abs Immature Granulocytes: 0.04 10*3/uL (ref 0.00–0.07)
Basophils Absolute: 0.1 10*3/uL (ref 0.0–0.1)
Basophils Relative: 1 %
Eosinophils Absolute: 0 10*3/uL (ref 0.0–0.5)
Eosinophils Relative: 0 %
HCT: 41.5 % (ref 36.0–46.0)
Hemoglobin: 13.5 g/dL (ref 12.0–15.0)
Immature Granulocytes: 0 %
Lymphocytes Relative: 25 %
Lymphs Abs: 2.3 10*3/uL (ref 0.7–4.0)
MCH: 30 pg (ref 26.0–34.0)
MCHC: 32.5 g/dL (ref 30.0–36.0)
MCV: 92.2 fL (ref 80.0–100.0)
Monocytes Absolute: 0.8 10*3/uL (ref 0.1–1.0)
Monocytes Relative: 9 %
Neutro Abs: 6 10*3/uL (ref 1.7–7.7)
Neutrophils Relative %: 65 %
Platelet Count: 163 10*3/uL (ref 150–400)
RBC: 4.5 MIL/uL (ref 3.87–5.11)
RDW: 12.7 % (ref 11.5–15.5)
WBC Count: 9.2 10*3/uL (ref 4.0–10.5)
nRBC: 0 % (ref 0.0–0.2)

## 2020-04-25 LAB — CMP (CANCER CENTER ONLY)
ALT: 15 U/L (ref 0–44)
AST: 16 U/L (ref 15–41)
Albumin: 4 g/dL (ref 3.5–5.0)
Alkaline Phosphatase: 90 U/L (ref 38–126)
Anion gap: 6 (ref 5–15)
BUN: 19 mg/dL (ref 6–20)
CO2: 28 mmol/L (ref 22–32)
Calcium: 9.9 mg/dL (ref 8.9–10.3)
Chloride: 104 mmol/L (ref 98–111)
Creatinine: 0.73 mg/dL (ref 0.44–1.00)
GFR, Est AFR Am: >60 mL/min (ref >60)
GFR, Estimated: >60 mL/min (ref >60)
Glucose, Bld: 78 mg/dL (ref 70–99)
Potassium: 4.2 mmol/L (ref 3.5–5.1)
Sodium: 138 mmol/L (ref 135–145)
Total Bilirubin: 0.4 mg/dL (ref 0.3–1.2)
Total Protein: 7.9 g/dL (ref 6.5–8.1)

## 2020-04-25 MED ORDER — ANASTROZOLE 1 MG PO TABS: 1.0000 mg | ORAL_TABLET | Freq: Every day | ORAL | 3 refills | Status: DC

## 2020-04-25 NOTE — Telephone Encounter (Signed)
Scheduled appointment per 9/24 los. Spoke to patient who is aware of appointment date and time. Patient declined calendar print out.

## 2020-05-05 ENCOUNTER — Other Ambulatory Visit: Payer: Self-pay

## 2020-05-05 ENCOUNTER — Encounter (INDEPENDENT_AMBULATORY_CARE_PROVIDER_SITE_OTHER): Payer: Self-pay | Admitting: Adult Health

## 2020-05-05 ENCOUNTER — Ambulatory Visit (INDEPENDENT_AMBULATORY_CARE_PROVIDER_SITE_OTHER): Payer: BC Managed Care – PPO | Admitting: Adult Health

## 2020-05-05 VITALS — BP 108/67 | HR 66 | Temp 97.7°F | Ht 63.0 in | Wt 165.0 lb

## 2020-05-05 DIAGNOSIS — E782 Mixed hyperlipidemia: Secondary | ICD-10-CM

## 2020-05-05 DIAGNOSIS — E669 Obesity, unspecified: Secondary | ICD-10-CM

## 2020-05-05 DIAGNOSIS — M5136 Other intervertebral disc degeneration, lumbar region: Secondary | ICD-10-CM | POA: Diagnosis not present

## 2020-05-05 DIAGNOSIS — Z6830 Body mass index (BMI) 30.0-30.9, adult: Secondary | ICD-10-CM

## 2020-05-05 DIAGNOSIS — E8881 Metabolic syndrome: Secondary | ICD-10-CM | POA: Diagnosis not present

## 2020-05-05 NOTE — Progress Notes (Signed)
Chief Complaint:   OBESITY Heather Hayes is here to discuss her progress Heather her obesity treatment plan along Heather follow-up of her obesity related diagnoses. Heather Hayes is on the Category 2 Plan and states she is following her eating plan approximately 80-85% of the time. Heather Hayes states she is exercising 0 minutes 0 times per week.  Today's visit was #: 74 Starting weight: 191 lbs Starting date: 05/15/2019 Today's weight: 165 lbs Today's date: 05/05/2020 Total lbs lost to date: 26 Total lbs lost since last in-office visit: 0  Interim History: Heather Hayes had recent flare-up of chronic Heather Hayes back pain and her PCP provided her a short taper of prednisone, which she has completed. She reports a dramatic reduction of pain and hunger levels have normalized. She would like to lose another 5 lbs, which would get her to 160 lbs.  Subjective:   Insulin resistance. Heather Hayes has a diagnosis of insulin resistance based on her elevated fasting insulin level >5. She continues to work on diet and exercise to decrease her risk of diabetes. 01/22/2020 A1c 5.5 Heather an insulin level of 7.5 (decreased from insulin level of  8.3 on 09/05/2019). Heather Hayes is not on metformin and denies polyphagia.  Lab Results  Component Value Date   INSULIN 7.5 01/22/2020   INSULIN 8.3 09/05/2019   INSULIN 14.9 05/15/2019   Lab Results  Component Value Date   HGBA1C 5.5 01/22/2020   Mixed hyperlipidemia. 01/22/2020 total and LDL cholesterol levels were above goal. Heather Hayes is not on a statin.  Lab Results  Component Value Date   CHOL 202 (H) 01/22/2020   HDL 66 01/22/2020   LDLCALC 112 (H) 01/22/2020   TRIG 138 01/22/2020   CHOLHDL 3.1 09/05/2019   Lab Results  Component Value Date   ALT 15 04/25/2020   AST 16 04/25/2020   ALKPHOS 90 04/25/2020   BILITOT 0.4 04/25/2020   The 10-year ASCVD risk score Heather Bussing DC Jr., Heather al., 2013) is: 1.9%   Values used to calculate the score:     Age: 59 years     Sex: Female     Is  Non-Hispanic African American: No     Diabetic: No     Tobacco smoker: No     Systolic Blood Pressure: 528 mmHg     Is BP treated: No     HDL Cholesterol: 66 mg/dL     Total Cholesterol: 202 mg/dL  Heather (degenerative disc disease), Heather Hayes, Heather Heather Hayes back pain. Heather Hayes completed a short taper of prednisone and her back pain has resolved. Hunger levels have normalized.  Assessment/Plan:   Insulin resistance. Heather Hayes will continue to work on weight loss, exercise, and decreasing simple carbohydrates to help decrease the risk of diabetes. Heather Hayes agreed to follow-up Heather [name] as directed to closely monitor her progress. Labs will be checked at her next office visit.  Mixed hyperlipidemia. Cardiovascular risk and specific lipid/LDL goals reviewed.  We discussed several lifestyle modifications today and Heather Hayes will continue to work on diet, exercise and weight loss efforts. Orders and follow up as documented in patient record. Labs will be checked at her next office visit.  Counseling Intensive lifestyle modifications are the first line treatment for this issue. . Dietary changes: Increase soluble fiber. Decrease simple carbohydrates. . Exercise changes: Moderate to vigorous-intensity aerobic activity 150 minutes per week if tolerated.  . Lipid-lowering medications: see documented in medical record.  Heather (degenerative disc disease), Heather Hayes, Heather Heather Hayes back pain. Heather Hayes will slowly increase her  daily walking as tolerated.  Class 1 obesity Heather serious comorbidity and body mass index (BMI) of 30.0 to 30.9 in adult, unspecified obesity type - BMI greater than 30 at the start of the program.  Heather Hayes is currently in the action stage of change. As such, her goal is to continue Heather weight loss efforts. She has agreed to the Category 2 Plan.   Heather Hayes will be performed at her next office visit due to weight loss plateau and shortness of air.  Exercise goals: Heather Hayes will slowly increase daily walking to a goal of 30  minutes 3 times per week.  Behavioral modification strategies: increasing lean protein intake, increasing water intake, meal planning and cooking strategies and planning for success.  Heather Hayes has agreed to follow-up Heather our clinic fasting in 2 weeks. She was informed of the importance of frequent follow-up visits to maximize her success Heather intensive lifestyle modifications for her multiple health conditions.   Objective:   Blood pressure 108/67, pulse 66, temperature 97.7 F (36.5 C), height 5\' 3"  (1.6 m), weight 165 lb (74.8 kg), SpO2 98 %. Body mass index is 29.23 kg/m.  General: Cooperative, alert, well developed, in no acute distress. HEENT: Conjunctivae and lids unremarkable. Cardiovascular: Regular rhythm.  Lungs: Normal work of breathing. Neurologic: No focal deficits.   Lab Results  Component Value Date   CREATININE 0.73 04/25/2020   BUN 19 04/25/2020   NA 138 04/25/2020   K 4.2 04/25/2020   CL 104 04/25/2020   CO2 28 04/25/2020   Lab Results  Component Value Date   ALT 15 04/25/2020   AST 16 04/25/2020   ALKPHOS 90 04/25/2020   BILITOT 0.4 04/25/2020   Lab Results  Component Value Date   HGBA1C 5.5 01/22/2020   HGBA1C 5.5 09/05/2019   HGBA1C 5.4 05/15/2019   Lab Results  Component Value Date   INSULIN 7.5 01/22/2020   INSULIN 8.3 09/05/2019   INSULIN 14.9 05/15/2019   Lab Results  Component Value Date   TSH 1.560 05/15/2019   Lab Results  Component Value Date   CHOL 202 (H) 01/22/2020   HDL 66 01/22/2020   LDLCALC 112 (H) 01/22/2020   TRIG 138 01/22/2020   CHOLHDL 3.1 09/05/2019   Lab Results  Component Value Date   WBC 9.2 04/25/2020   HGB 13.5 04/25/2020   HCT 41.5 04/25/2020   MCV 92.2 04/25/2020   PLT 163 04/25/2020   No results found for: IRON, TIBC, FERRITIN  Attestation Statements:   Reviewed by clinician on day of visit: allergies, medications, problem list, medical history, surgical history, family history, social history, and  previous encounter notes.  Time spent on visit including pre-visit chart review and post-visit charting and care was 28 minutes.   I, Michaelene Song, am acting as Location manager for [employer], NP-C   I have reviewed the above documentation for accuracy and completeness, and I agree Heather the above. -  Esaw Grandchild, NP

## 2020-05-06 DIAGNOSIS — M5136 Other intervertebral disc degeneration, lumbar region: Secondary | ICD-10-CM | POA: Insufficient documentation

## 2020-05-20 ENCOUNTER — Ambulatory Visit (INDEPENDENT_AMBULATORY_CARE_PROVIDER_SITE_OTHER): Payer: BC Managed Care – PPO | Admitting: Adult Health

## 2020-05-20 ENCOUNTER — Other Ambulatory Visit: Payer: Self-pay

## 2020-05-20 VITALS — BP 126/74 | HR 65 | Temp 98.0°F | Ht 63.0 in | Wt 164.0 lb

## 2020-05-20 DIAGNOSIS — Z9189 Other specified personal risk factors, not elsewhere classified: Secondary | ICD-10-CM | POA: Diagnosis not present

## 2020-05-20 DIAGNOSIS — E782 Mixed hyperlipidemia: Secondary | ICD-10-CM

## 2020-05-20 DIAGNOSIS — R0602 Shortness of breath: Secondary | ICD-10-CM | POA: Diagnosis not present

## 2020-05-20 DIAGNOSIS — Z6830 Body mass index (BMI) 30.0-30.9, adult: Secondary | ICD-10-CM

## 2020-05-20 DIAGNOSIS — E8881 Metabolic syndrome: Secondary | ICD-10-CM

## 2020-05-20 DIAGNOSIS — E669 Obesity, unspecified: Secondary | ICD-10-CM

## 2020-05-21 LAB — COMPREHENSIVE METABOLIC PANEL
ALT: 13 IU/L (ref 0–32)
AST: 14 IU/L (ref 0–40)
Albumin/Globulin Ratio: 1.3 (ref 1.2–2.2)
Albumin: 4.3 g/dL (ref 3.8–4.9)
Alkaline Phosphatase: 106 IU/L (ref 44–121)
BUN/Creatinine Ratio: 22 (ref 9–23)
BUN: 16 mg/dL (ref 6–24)
Bilirubin Total: 0.5 mg/dL (ref 0.0–1.2)
CO2: 25 mmol/L (ref 20–29)
Calcium: 10 mg/dL (ref 8.7–10.2)
Chloride: 100 mmol/L (ref 96–106)
Creatinine, Ser: 0.72 mg/dL (ref 0.57–1.00)
GFR calc Af Amer: 106 mL/min/{1.73_m2} (ref >59)
GFR calc non Af Amer: 92 mL/min/{1.73_m2} (ref >59)
Globulin, Total: 3.2 g/dL (ref 1.5–4.5)
Glucose: 88 mg/dL (ref 65–99)
Potassium: 4.4 mmol/L (ref 3.5–5.2)
Sodium: 140 mmol/L (ref 134–144)
Total Protein: 7.5 g/dL (ref 6.0–8.5)

## 2020-05-21 LAB — LIPID PANEL
Chol/HDL Ratio: 2.8 ratio (ref 0.0–4.4)
Cholesterol, Total: 228 mg/dL — ABNORMAL HIGH (ref 100–199)
HDL: 82 mg/dL (ref >39)
LDL Chol Calc (NIH): 124 mg/dL — ABNORMAL HIGH (ref 0–99)
Triglycerides: 128 mg/dL (ref 0–149)
VLDL Cholesterol Cal: 22 mg/dL (ref 5–40)

## 2020-05-21 LAB — INSULIN, RANDOM: INSULIN: 8 u[IU]/mL (ref 2.6–24.9)

## 2020-05-21 NOTE — Progress Notes (Signed)
Chief Complaint:   OBESITY Heather Hayes is here to discuss her progress with her obesity treatment plan along with follow-up of her obesity related diagnoses. Heather Hayes is on the Category 2 Plan and states she is following her eating plan approximately 80% of the time. Heather Hayes states she is exercising 0 minutes 0 times per week.  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  She is eager to see the results of her metabolism test.  Her ultimate goal is to lose down another 6-9 lbs.  Subjective:   Insulin resistance. Heather Hayes has a diagnosis of insulin resistance based on her elevated fasting insulin level >5. She continues to work on diet and exercise to decrease her risk of diabetes. 01/22/2020 A1c 5.5 with an insulin level 7.5, improved from 8.3 on 09/05/2019.  Lab Results  Component Value Date   INSULIN 7.5 01/22/2020   INSULIN 8.3 09/05/2019   INSULIN 14.9 05/15/2019   Lab Results  Component Value Date   HGBA1C 5.5 01/22/2020   Mixed hyperlipidemia. 01/22/2020 total and LDL cholesterol levels were both above goal. Heather Hayes is not on a statin.  SOB (shortness of breath) on exertion. Heather Hayes reports very mild shortness of breath with extreme exertion. She denies chest pain with exertion. She denies tobacco/vape use. IC checked today.  At risk for heart disease. Heather Hayes is at a higher than average risk for cardiovascular disease due to hyperlipidemia and obesity.   Assessment/Plan:   Insulin resistance. Heather Hayes will continue to work on weight loss, exercise, and decreasing simple carbohydrates to  help decrease the risk of diabetes. Heather Hayes agreed to follow-up with [name] as directed to closely monitor her progress. Labs will be checked today.  Mixed hyperlipidemia. Cardiovascular risk and specific lipid/LDL goals reviewed.  We discussed several lifestyle modifications today and Heather Hayes will continue to work on diet, exercise and weight loss efforts. Orders and follow up as documented in patient record. Labs will be checked today.  Counseling Intensive lifestyle modifications are the first line treatment for this issue. . Dietary changes: Increase soluble fiber. Decrease simple carbohydrates. . Exercise changes: Moderate to vigorous-intensity aerobic activity 150 minutes per week if tolerated. . Lipid-lowering medications: see documented in medical record.  SOB (shortness of breath) on exertion.  Heather Hayes's shortness of breath appears to be obesity related and exercise induced. She has agreed to work on weight loss and gradually increase exercise to treat her exercise induced shortness of breath. Will continue to monitor closely. IC performed today revealing RMR slightly reduced from initial intake.  At risk for heart disease. Heather Hayes was given approximately 15 minutes of coronary artery disease prevention counseling today. She is 59 y.o. female and has risk factors for heart disease including obesity. We discussed intensive lifestyle modifications today with an emphasis on specific weight loss instructions and strategies.   Heather Hayes was employed today to elicit superior memory formation and behavioral change.  Class 1 obesity with serious comorbidity and body mass index (BMI) of 30.0 to 30.9 in adult, unspecified obesity type - BMI greater than 30 at  start of program.  Heather Hayes is currently in the action stage of change. As such, her goal is to continue with weight loss efforts. She has agreed to the Category 1 Plan. She was changed from the Category 2 Plan to the Category 1 Plan to account  for decreased RMR.  Handouts were provided on the Category 1 Meal Plan and Additional Breakfast Options.  Exercise goals: For substantial health benefits, adults should do at least 150 minutes (2 hours and 30 minutes) a week of moderate-intensity, or 75 minutes (1 hour and 15 minutes) a week of vigorous-intensity aerobic physical activity, or an equivalent combination of moderate- and vigorous-intensity aerobic activity. Aerobic activity should be performed in episodes of at least 10 minutes, and preferably, it should be spread throughout the week.  Behavioral modification strategies: increasing lean protein intake, decreasing simple carbohydrates, increasing water intake, meal planning and cooking strategies, better snacking choices and planning for success.  Heather Hayes has agreed to follow-up with our clinic in 2 weeks. She was informed of the importance of frequent follow-up visits to maximize her success with intensive lifestyle modifications for her multiple health conditions.   Heather Hayes was informed we would discuss her lab results at her next visit unless there is a critical issue that needs to be addressed sooner. Heather Hayes agreed to keep her next visit at the agreed upon time to discuss these results.  Objective:   Blood pressure 126/74, pulse 65, temperature 98 F (36.7 C), height 5\' 3"  (1.6 m), weight 164 lb (74.4 kg), SpO2 98 %. Body mass index is 29.05 kg/m.  General: Cooperative, alert, well developed, in no acute distress. HEENT: Conjunctivae and lids unremarkable. Cardiovascular: Regular rhythm.  Lungs: Normal work of breathing. Neurologic: No focal deficits.   Lab Results  Component Value Date   HGBA1C 5.5 01/22/2020   HGBA1C 5.5 09/05/2019   HGBA1C 5.4 05/15/2019   Lab Results  Component Value Date   INSULIN 7.5 01/22/2020   INSULIN 8.3 09/05/2019   INSULIN 14.9 05/15/2019   Lab Results  Component Value Date   TSH 1.560 05/15/2019   Lab Results  Component Value Date    WBC 9.2 04/25/2020   HGB 13.5 04/25/2020   HCT 41.5 04/25/2020   MCV 92.2 04/25/2020   PLT 163 04/25/2020   No results found for: IRON, TIBC, FERRITIN  Attestation Statements:   Reviewed by clinician on day of visit: allergies, medications, problem list, medical history, surgical history, family history, social history, and previous encounter notes.  I, Michaelene Song, am acting as Location manager for [employer], NP-C   I have reviewed the above documentation for accuracy and completeness, and I agree with the above. -  Sanyah Molnar d. Rajohn Henery, NP-C

## 2020-05-22 DIAGNOSIS — R0602 Shortness of breath: Secondary | ICD-10-CM | POA: Insufficient documentation

## 2020-06-05 ENCOUNTER — Ambulatory Visit (INDEPENDENT_AMBULATORY_CARE_PROVIDER_SITE_OTHER): Payer: BC Managed Care – PPO | Admitting: Adult Health

## 2020-06-12 ENCOUNTER — Other Ambulatory Visit: Payer: Self-pay

## 2020-06-12 ENCOUNTER — Encounter (INDEPENDENT_AMBULATORY_CARE_PROVIDER_SITE_OTHER): Payer: Self-pay | Admitting: Family Medicine

## 2020-06-12 ENCOUNTER — Ambulatory Visit (INDEPENDENT_AMBULATORY_CARE_PROVIDER_SITE_OTHER): Payer: BC Managed Care – PPO | Admitting: Family Medicine

## 2020-06-12 VITALS — BP 129/77 | HR 91 | Temp 97.9°F | Ht 63.0 in | Wt 166.0 lb

## 2020-06-12 DIAGNOSIS — F3289 Other specified depressive episodes: Secondary | ICD-10-CM | POA: Diagnosis not present

## 2020-06-12 DIAGNOSIS — Z9189 Other specified personal risk factors, not elsewhere classified: Secondary | ICD-10-CM | POA: Diagnosis not present

## 2020-06-12 DIAGNOSIS — E669 Obesity, unspecified: Secondary | ICD-10-CM

## 2020-06-12 DIAGNOSIS — Z6830 Body mass index (BMI) 30.0-30.9, adult: Secondary | ICD-10-CM | POA: Diagnosis not present

## 2020-06-12 MED ORDER — TOPIRAMATE 50 MG PO TABS: 50.0000 mg | ORAL_TABLET | Freq: Every day | ORAL | 0 refills | Status: DC

## 2020-06-12 NOTE — Progress Notes (Signed)
Chief Complaint:   OBESITY Heather Hayes is here to discuss her progress with her obesity treatment plan along with follow-up of her obesity related diagnoses. Heather Hayes is on the Category 1 Plan or the Category 2 Plan and states she is following her eating plan approximately 70-75% of the time. Heather Hayes states she is on the treadmill for 30 minutes 3 times per week.  Today's visit was #: 21 Starting weight: 191 lbs Starting date: 05/15/2019 Today's weight: 166 lbs Today's date: 06/12/2020 Total lbs lost to date: 25 Total lbs lost since last in-office visit: 0  Interim History: Heather Hayes has had a cold recently and wasn't able to follow her eating plan closely. She has done well overall but notes increased stress eating at times.  Subjective:   1. Other depression, with emotional eating  Heather Hayes notes increased temptations and cravings especially at work, and worse recently. She denies a history of nephrolithiasis, and she is post-menopausal.  2. At risk for nausea Heather Hayes is at risk for nausea due to new medications.  Assessment/Plan:   1. Other depression, with emotional eating  Behavior modification techniques were discussed today to help Moranda deal with her emotional/non-hunger eating behaviors. Shatia agreed to start Topamax 50 mg qhs with no refills. Orders and follow up as documented in patient record.   - topiramate (TOPAMAX) 50 MG tablet; Take 1 tablet (50 mg total) by mouth at bedtime.  Dispense: 30 tablet; Refill: 0  2. At risk for nausea Heather Hayes was given approximately 15 minutes of nausea prevention counseling today. Heather Hayes is at risk for nausea due to her new or current medication. She was encouraged to titrate her medication slowly, make sure to stay hydrated, eat smaller portions throughout the day, and avoid high fat meals.   3. Class 1 obesity with serious comorbidity and body mass index (BMI) of 30.0 to 30.9 in adult, unspecified obesity type Heather Hayes is currently in the action stage of  change. As such, her goal is to continue with weight loss efforts. She has agreed to the Category 2 Plan.   Exercise goals: As is.  Behavioral modification strategies: holiday eating strategies .  Heather Hayes has agreed to follow-up with our clinic in 3 to 4 weeks. She was informed of the importance of frequent follow-up visits to maximize her success with intensive lifestyle modifications for her multiple health conditions.   Objective:   Blood pressure 129/77, pulse 91, temperature 97.9 F (36.6 C), height 5\' 3"  (1.6 m), weight 166 lb (75.3 kg), SpO2 99 %. Body mass index is 29.41 kg/m.  General: Cooperative, alert, well developed, in no acute distress. HEENT: Conjunctivae and lids unremarkable. Cardiovascular: Regular rhythm.  Lungs: Normal work of breathing. Neurologic: No focal deficits.   Lab Results  Component Value Date   CREATININE 0.72 05/20/2020   BUN 16 05/20/2020   NA 140 05/20/2020   K 4.4 05/20/2020   CL 100 05/20/2020   CO2 25 05/20/2020   Lab Results  Component Value Date   ALT 13 05/20/2020   AST 14 05/20/2020   ALKPHOS 106 05/20/2020   BILITOT 0.5 05/20/2020   Lab Results  Component Value Date   HGBA1C 5.5 01/22/2020   HGBA1C 5.5 09/05/2019   HGBA1C 5.4 05/15/2019   Lab Results  Component Value Date   INSULIN 8.0 05/20/2020   INSULIN 7.5 01/22/2020   INSULIN 8.3 09/05/2019   INSULIN 14.9 05/15/2019   Lab Results  Component Value Date   TSH 1.560  05/15/2019   Lab Results  Component Value Date   CHOL 228 (H) 05/20/2020   HDL 82 05/20/2020   LDLCALC 124 (H) 05/20/2020   TRIG 128 05/20/2020   CHOLHDL 2.8 05/20/2020   Lab Results  Component Value Date   WBC 9.2 04/25/2020   HGB 13.5 04/25/2020   HCT 41.5 04/25/2020   MCV 92.2 04/25/2020   PLT 163 04/25/2020   No results found for: IRON, TIBC, FERRITIN  Attestation Statements:   Reviewed by clinician on day of visit: allergies, medications, problem list, medical history, surgical  history, family history, social history, and previous encounter notes.   I, Trixie Dredge, am acting as transcriptionist for Dennard Nip, MD.  I have reviewed the above documentation for accuracy and completeness, and I agree with the above. -  Dennard Nip, MD

## 2020-07-10 ENCOUNTER — Ambulatory Visit (INDEPENDENT_AMBULATORY_CARE_PROVIDER_SITE_OTHER): Payer: BC Managed Care – PPO | Admitting: Family Medicine

## 2020-07-22 ENCOUNTER — Encounter (INDEPENDENT_AMBULATORY_CARE_PROVIDER_SITE_OTHER): Payer: Self-pay | Admitting: Family Medicine

## 2020-07-22 ENCOUNTER — Ambulatory Visit (INDEPENDENT_AMBULATORY_CARE_PROVIDER_SITE_OTHER): Payer: BC Managed Care – PPO | Admitting: Family Medicine

## 2020-07-22 ENCOUNTER — Other Ambulatory Visit: Payer: Self-pay

## 2020-07-22 VITALS — BP 145/76 | HR 67 | Temp 98.0°F | Ht 63.0 in | Wt 167.0 lb

## 2020-07-22 DIAGNOSIS — F3289 Other specified depressive episodes: Secondary | ICD-10-CM

## 2020-07-22 DIAGNOSIS — Z9189 Other specified personal risk factors, not elsewhere classified: Secondary | ICD-10-CM | POA: Diagnosis not present

## 2020-07-22 DIAGNOSIS — E669 Obesity, unspecified: Secondary | ICD-10-CM

## 2020-07-22 DIAGNOSIS — Z6830 Body mass index (BMI) 30.0-30.9, adult: Secondary | ICD-10-CM

## 2020-07-22 DIAGNOSIS — R030 Elevated blood-pressure reading, without diagnosis of hypertension: Secondary | ICD-10-CM | POA: Diagnosis not present

## 2020-07-22 MED ORDER — TOPIRAMATE 50 MG PO TABS: 50.0000 mg | ORAL_TABLET | Freq: Every day | ORAL | 0 refills | Status: DC

## 2020-07-23 NOTE — Progress Notes (Signed)
Chief Complaint:   OBESITY Heather Hayes is here to discuss her progress with her obesity treatment plan along with follow-up of her obesity related diagnoses. Heather Hayes is on the Category 2 Plan and states she is following her eating plan approximately 60-70% of the time. Heather Hayes states she is doing 0 minutes 0 times per week.  Today's visit was #: 22 Starting weight: 191 lbs Starting date: 05/15/2019 Today's weight: 167 lbs Today's date: 07/22/2020 Total lbs lost to date: 24 Total lbs lost since last in-office visit: 0  Interim History: Heather Hayes continues to work on weight loss. She is retaining some fluid and she has done well actually maintaining her weight. She is going on a family vacation, and she is open to discussing Christmas.  Subjective:   1. Elevated blood pressure reading Heather Hayes's blood pressure is elevated today, and she is not on antihypertensives. She does have a sick dog which has increased her stress. She denies chest pain.  2. Other depression, with emotional eating  Heather Hayes notes some increased stress eating with her sick dog, but she is still mindful of her food choices.  3. At risk for impaired metabolic function Heather Hayes is at increased risk for impaired metabolic function due to current nutrition and muscle mass.  Assessment/Plan:   1. Elevated blood pressure reading Heather Hayes will continue with healthy weight loss, diet, and exercise to improve blood pressure control. We will watch for signs of hypotension as she continues her lifestyle modifications. We will recheck her blood pressure in 3 to 4 weeks, may need to discuss medications if her blood pressure is still elevated.  2. Other depression, with emotional eating  Behavior modification techniques were discussed today to help Heather Hayes deal with her emotional/non-hunger eating behaviors. We will refill Topamax for 1 month. Orders and follow up as documented in patient record.   - topiramate (TOPAMAX) 50 MG tablet; Take 1 tablet (50 mg  total) by mouth at bedtime.  Dispense: 30 tablet; Refill: 0  3. At risk for impaired metabolic function Heather Hayes was given approximately 15 minutes of impaired  metabolic function prevention counseling today. We discussed intensive lifestyle modifications today with an emphasis on specific nutrition and exercise instructions and strategies.   Repetitive spaced learning was employed today to elicit superior memory formation and behavioral change.  4. Class 1 obesity with serious comorbidity and body mass index (BMI) of 30.0 to 30.9 in adult, unspecified obesity type Heather Hayes is currently in the action stage of change. As such, her goal is to continue with weight loss efforts. She has agreed to the Category 2 Plan.   Behavioral modification strategies: meal planning and cooking strategies and holiday eating strategies .  Heather Hayes has agreed to follow-up with our clinic in 3 to 4 weeks. She was informed of the importance of frequent follow-up visits to maximize her success with intensive lifestyle modifications for her multiple health conditions.   Objective:   Blood pressure (!) 145/76, pulse 67, temperature 98 F (36.7 C), height 5\' 3"  (1.6 m), weight 167 lb (75.8 kg), SpO2 98 %. Body mass index is 29.58 kg/m.  General: Cooperative, alert, well developed, in no acute distress. HEENT: Conjunctivae and lids unremarkable. Cardiovascular: Regular rhythm.  Lungs: Normal work of breathing. Neurologic: No focal deficits.   Lab Results  Component Value Date   CREATININE 0.72 05/20/2020   BUN 16 05/20/2020   NA 140 05/20/2020   K 4.4 05/20/2020   CL 100 05/20/2020   CO2 25 05/20/2020  Lab Results  Component Value Date   ALT 13 05/20/2020   AST 14 05/20/2020   ALKPHOS 106 05/20/2020   BILITOT 0.5 05/20/2020   Lab Results  Component Value Date   HGBA1C 5.5 01/22/2020   HGBA1C 5.5 09/05/2019   HGBA1C 5.4 05/15/2019   Lab Results  Component Value Date   INSULIN 8.0 05/20/2020   INSULIN 7.5  01/22/2020   INSULIN 8.3 09/05/2019   INSULIN 14.9 05/15/2019   Lab Results  Component Value Date   TSH 1.560 05/15/2019   Lab Results  Component Value Date   CHOL 228 (H) 05/20/2020   HDL 82 05/20/2020   LDLCALC 124 (H) 05/20/2020   TRIG 128 05/20/2020   CHOLHDL 2.8 05/20/2020   Lab Results  Component Value Date   WBC 9.2 04/25/2020   HGB 13.5 04/25/2020   HCT 41.5 04/25/2020   MCV 92.2 04/25/2020   PLT 163 04/25/2020   No results found for: IRON, TIBC, FERRITIN  Attestation Statements:   Reviewed by clinician on day of visit: allergies, medications, problem list, medical history, surgical history, family history, social history, and previous encounter notes.   I, Trixie Dredge, am acting as transcriptionist for Dennard Nip, MD.  I have reviewed the above documentation for accuracy and completeness, and I agree with the above. -  Dennard Nip, MD

## 2020-08-13 ENCOUNTER — Other Ambulatory Visit (INDEPENDENT_AMBULATORY_CARE_PROVIDER_SITE_OTHER): Payer: Self-pay | Admitting: Family Medicine

## 2020-08-13 DIAGNOSIS — F3289 Other specified depressive episodes: Secondary | ICD-10-CM

## 2020-08-13 NOTE — Telephone Encounter (Signed)
Dr.Eldred 

## 2020-08-14 ENCOUNTER — Other Ambulatory Visit (INDEPENDENT_AMBULATORY_CARE_PROVIDER_SITE_OTHER): Payer: Self-pay | Admitting: Bariatrics

## 2020-08-14 DIAGNOSIS — F3289 Other specified depressive episodes: Secondary | ICD-10-CM

## 2020-08-14 NOTE — Telephone Encounter (Signed)
Refill request, Dr. Leafy Ro out

## 2020-08-18 NOTE — Telephone Encounter (Signed)
Last OV with Dr Brown 

## 2020-08-18 NOTE — Telephone Encounter (Signed)
90day refill per [employer]

## 2020-08-20 ENCOUNTER — Other Ambulatory Visit: Payer: Self-pay | Admitting: Hematology

## 2020-08-20 ENCOUNTER — Other Ambulatory Visit: Payer: Self-pay | Admitting: Oral Surgery

## 2020-08-20 DIAGNOSIS — Z853 Personal history of malignant neoplasm of breast: Secondary | ICD-10-CM

## 2020-08-21 ENCOUNTER — Ambulatory Visit (INDEPENDENT_AMBULATORY_CARE_PROVIDER_SITE_OTHER): Payer: BC Managed Care – PPO | Admitting: Family Medicine

## 2020-08-28 ENCOUNTER — Encounter (INDEPENDENT_AMBULATORY_CARE_PROVIDER_SITE_OTHER): Payer: Self-pay | Admitting: Family Medicine

## 2020-08-28 ENCOUNTER — Other Ambulatory Visit: Payer: Self-pay

## 2020-08-28 ENCOUNTER — Ambulatory Visit (INDEPENDENT_AMBULATORY_CARE_PROVIDER_SITE_OTHER): Payer: BC Managed Care – PPO | Admitting: Family Medicine

## 2020-08-28 VITALS — BP 147/81 | HR 72 | Temp 98.0°F | Ht 63.0 in | Wt 167.0 lb

## 2020-08-28 DIAGNOSIS — F3289 Other specified depressive episodes: Secondary | ICD-10-CM | POA: Diagnosis not present

## 2020-08-28 DIAGNOSIS — Z6830 Body mass index (BMI) 30.0-30.9, adult: Secondary | ICD-10-CM

## 2020-08-28 DIAGNOSIS — Z9189 Other specified personal risk factors, not elsewhere classified: Secondary | ICD-10-CM

## 2020-08-28 DIAGNOSIS — E669 Obesity, unspecified: Secondary | ICD-10-CM

## 2020-08-28 DIAGNOSIS — R030 Elevated blood-pressure reading, without diagnosis of hypertension: Secondary | ICD-10-CM | POA: Diagnosis not present

## 2020-08-28 NOTE — Progress Notes (Signed)
Chief Complaint:   OBESITY Heather Hayes is here to discuss her progress with her obesity treatment plan along with follow-up of her obesity related diagnoses. Heather Hayes is on the Category 2 Plan and states she is following her eating plan approximately 65% of the time. Heather Hayes states she is bike riding and walking for 3-5 miles 2-3 times per week.   Today's visit was #: 23 Starting weight: 191 lbs Starting date: 05/15/2019 Today's weight: 167 lbs Today's date: 08/28/2020 Total lbs lost to date: 24 Total lbs lost since last in-office visit: 0  Interim History: Heather Hayes has had a very challenging last month, and she has done exceptionally well with maintaining her weight and avoiding weight gain. She continues to be mindful of her food choices and portions controls. She expects the next month will be easier to follow her Category 2 plan.  Subjective:   1. Elevated blood pressure reading Heather Hayes's blood pressure elevated today, but normally stable with diet and weight loss. She has had a lot of stress recently.  2. Other depression, with emotional eating  Heather Hayes is on Topamax and she notes tingling in her feet and a couple of episodes of being lightheaded. She stopped her Topamax and she feels this has improved.  3. At risk for hypertension Heather Hayes is at a higher than average risk of hypertension due to elevated blood pressure.  Assessment/Plan:   1. Elevated blood pressure reading Heather Hayes is working on healthy weight loss and exercise to improve blood pressure control. We will watch for signs of hypotension as she continues her lifestyle modifications. We will recheck her blood pressure in 3 to 4 weeks, may need to consider medications in addition to diet and exercise if her blood pressure is still elevated.  2. Other depression, with emotional eating  Behavior modification techniques were discussed today to help Heather Hayes deal with her emotional/non-hunger eating behaviors. Heather Hayes agreed to decrease Topamax to 1/2  tablet PO qhs, no refill needed and increase her water intake. Will continue to monitor closely. Orders and follow up as documented in patient record.   3. At risk for hypertension Heather Hayes was given approximately 15 minutes of hypertension prevention counseling today. Heather Hayes is at risk for hypertension due to obesity. We discussed intensive lifestyle modifications today with an emphasis on weight loss as well as increasing exercise and decreasing salt intake.  Repetitive spaced learning was employed today to elicit superior memory formation and behavioral change.  4. Class 1 obesity with serious comorbidity and body mass index (BMI) of 30.0 to 30.9 in adult, unspecified obesity type Heather Hayes is currently in the action stage of change. As such, her goal is to continue with weight loss efforts. She has agreed to the Category 2 Plan.   Exercise goals: As is.  Behavioral modification strategies: increasing water intake.  Heather Hayes has agreed to follow-up with our clinic in 3 to 4 weeks. She was informed of the importance of frequent follow-up visits to maximize her success with intensive lifestyle modifications for her multiple health conditions.   Objective:   Blood pressure (!) 147/81, pulse 72, temperature 98 F (36.7 C), temperature source Oral, height 5\' 3"  (1.6 m), weight 167 lb (75.8 kg), SpO2 100 %. Body mass index is 29.58 kg/m.  General: Cooperative, alert, well developed, in no acute distress. HEENT: Conjunctivae and lids unremarkable. Cardiovascular: Regular rhythm.  Lungs: Normal work of breathing. Neurologic: No focal deficits.   Lab Results  Component Value Date   CREATININE  0.72 05/20/2020   BUN 16 05/20/2020   NA 140 05/20/2020   K 4.4 05/20/2020   CL 100 05/20/2020   CO2 25 05/20/2020   Lab Results  Component Value Date   ALT 13 05/20/2020   AST 14 05/20/2020   ALKPHOS 106 05/20/2020   BILITOT 0.5 05/20/2020   Lab Results  Component Value Date   HGBA1C 5.5  01/22/2020   HGBA1C 5.5 09/05/2019   HGBA1C 5.4 05/15/2019   Lab Results  Component Value Date   INSULIN 8.0 05/20/2020   INSULIN 7.5 01/22/2020   INSULIN 8.3 09/05/2019   INSULIN 14.9 05/15/2019   Lab Results  Component Value Date   TSH 1.560 05/15/2019   Lab Results  Component Value Date   CHOL 228 (H) 05/20/2020   HDL 82 05/20/2020   LDLCALC 124 (H) 05/20/2020   TRIG 128 05/20/2020   CHOLHDL 2.8 05/20/2020   Lab Results  Component Value Date   WBC 9.2 04/25/2020   HGB 13.5 04/25/2020   HCT 41.5 04/25/2020   MCV 92.2 04/25/2020   PLT 163 04/25/2020   No results found for: IRON, TIBC, FERRITIN  Attestation Statements:   Reviewed by clinician on day of visit: allergies, medications, problem list, medical history, surgical history, family history, social history, and previous encounter notes.   I, Trixie Dredge, am acting as transcriptionist for Dennard Nip, MD.  I have reviewed the above documentation for accuracy and completeness, and I agree with the above. -  Dennard Nip, MD

## 2020-09-24 ENCOUNTER — Telehealth: Payer: Self-pay | Admitting: Nurse Practitioner

## 2020-09-24 NOTE — Telephone Encounter (Signed)
Called patient to move upcoming appointment due to provider's schedule. Patient stated she was having labs drawn on Monday and requested those results used for provider's visit due to her insurance. Patient gave consent to reschedule provider visit to an earlier date if needed. Sent request to provider.

## 2020-09-25 DIAGNOSIS — Z23 Encounter for immunization: Secondary | ICD-10-CM | POA: Diagnosis not present

## 2020-09-25 DIAGNOSIS — Z0000 Encounter for general adult medical examination without abnormal findings: Secondary | ICD-10-CM | POA: Diagnosis not present

## 2020-09-26 ENCOUNTER — Telehealth: Payer: Self-pay | Admitting: Nurse Practitioner

## 2020-09-26 NOTE — Telephone Encounter (Signed)
Rescheduled upcoming appointment per patient's request. Patient is aware of changes. 

## 2020-09-29 ENCOUNTER — Ambulatory Visit (INDEPENDENT_AMBULATORY_CARE_PROVIDER_SITE_OTHER): Payer: BC Managed Care – PPO | Admitting: Family Medicine

## 2020-09-29 ENCOUNTER — Encounter (INDEPENDENT_AMBULATORY_CARE_PROVIDER_SITE_OTHER): Payer: Self-pay | Admitting: Family Medicine

## 2020-09-29 ENCOUNTER — Other Ambulatory Visit: Payer: Self-pay

## 2020-09-29 VITALS — BP 129/76 | HR 68 | Temp 98.3°F | Ht 63.0 in | Wt 165.0 lb

## 2020-09-29 DIAGNOSIS — E559 Vitamin D deficiency, unspecified: Secondary | ICD-10-CM

## 2020-09-29 DIAGNOSIS — E782 Mixed hyperlipidemia: Secondary | ICD-10-CM | POA: Diagnosis not present

## 2020-09-29 DIAGNOSIS — F3289 Other specified depressive episodes: Secondary | ICD-10-CM | POA: Diagnosis not present

## 2020-09-29 DIAGNOSIS — E669 Obesity, unspecified: Secondary | ICD-10-CM

## 2020-09-29 DIAGNOSIS — Z9189 Other specified personal risk factors, not elsewhere classified: Secondary | ICD-10-CM

## 2020-09-29 DIAGNOSIS — E8881 Metabolic syndrome: Secondary | ICD-10-CM

## 2020-09-29 DIAGNOSIS — Z6830 Body mass index (BMI) 30.0-30.9, adult: Secondary | ICD-10-CM

## 2020-09-30 LAB — CBC WITH DIFFERENTIAL/PLATELET
Basophils Absolute: 0 10*3/uL (ref 0.0–0.2)
Basos: 1 %
EOS (ABSOLUTE): 0.1 10*3/uL (ref 0.0–0.4)
Eos: 2 %
Hematocrit: 40.8 % (ref 34.0–46.6)
Hemoglobin: 13.7 g/dL (ref 11.1–15.9)
Immature Grans (Abs): 0 10*3/uL (ref 0.0–0.1)
Immature Granulocytes: 1 %
Lymphocytes Absolute: 1.3 10*3/uL (ref 0.7–3.1)
Lymphs: 26 %
MCH: 30.8 pg (ref 26.6–33.0)
MCHC: 33.6 g/dL (ref 31.5–35.7)
MCV: 92 fL (ref 79–97)
Monocytes Absolute: 0.3 10*3/uL (ref 0.1–0.9)
Monocytes: 6 %
Neutrophils Absolute: 3.3 10*3/uL (ref 1.4–7.0)
Neutrophils: 64 %
Platelets: 275 10*3/uL (ref 150–450)
RBC: 4.45 x10E6/uL (ref 3.77–5.28)
RDW: 12.2 % (ref 11.7–15.4)
WBC: 5.1 10*3/uL (ref 3.4–10.8)

## 2020-09-30 LAB — HEMOGLOBIN A1C
Est. average glucose Bld gHb Est-mCnc: 114 mg/dL
Hgb A1c MFr Bld: 5.6 % (ref 4.8–5.6)

## 2020-09-30 LAB — COMPREHENSIVE METABOLIC PANEL
ALT: 23 IU/L (ref 0–32)
AST: 19 IU/L (ref 0–40)
Albumin/Globulin Ratio: 1.8 (ref 1.2–2.2)
Albumin: 4.6 g/dL (ref 3.8–4.9)
Alkaline Phosphatase: 99 IU/L (ref 44–121)
BUN/Creatinine Ratio: 28 — ABNORMAL HIGH (ref 9–23)
BUN: 20 mg/dL (ref 6–24)
Bilirubin Total: 0.4 mg/dL (ref 0.0–1.2)
CO2: 23 mmol/L (ref 20–29)
Calcium: 10 mg/dL (ref 8.7–10.2)
Chloride: 99 mmol/L (ref 96–106)
Creatinine, Ser: 0.72 mg/dL (ref 0.57–1.00)
Globulin, Total: 2.6 g/dL (ref 1.5–4.5)
Glucose: 87 mg/dL (ref 65–99)
Potassium: 4.6 mmol/L (ref 3.5–5.2)
Sodium: 136 mmol/L (ref 134–144)
Total Protein: 7.2 g/dL (ref 6.0–8.5)
eGFR: 96 mL/min/{1.73_m2} (ref >59)

## 2020-09-30 LAB — LIPID PANEL WITH LDL/HDL RATIO
Cholesterol, Total: 197 mg/dL (ref 100–199)
HDL: 64 mg/dL (ref >39)
LDL Chol Calc (NIH): 116 mg/dL — ABNORMAL HIGH (ref 0–99)
LDL/HDL Ratio: 1.8 ratio (ref 0.0–3.2)
Triglycerides: 94 mg/dL (ref 0–149)
VLDL Cholesterol Cal: 17 mg/dL (ref 5–40)

## 2020-09-30 LAB — INSULIN, RANDOM: INSULIN: 6.5 u[IU]/mL (ref 2.6–24.9)

## 2020-09-30 LAB — VITAMIN D 25 HYDROXY (VIT D DEFICIENCY, FRACTURES): Vit D, 25-Hydroxy: 77.6 ng/mL (ref 30.0–100.0)

## 2020-09-30 NOTE — Progress Notes (Signed)
Chief Complaint:   OBESITY Heather Hayes is here to discuss her progress with her obesity treatment plan along with follow-up of her obesity related diagnoses. Heather Hayes is on the Category 2 Plan and states she is following her eating plan approximately 70% of the time. Heather Hayes states she is on the treadmill for 30 minutes 2 times per week.  Today's visit was #: 24 Starting weight: 191 lbs Starting date: 05/15/2019 Today's weight: 165 lbs Today's date: 09/29/2020 Total lbs lost to date: 26 Total lbs lost since last in-office visit: 2  Interim History: Heather Hayes continues to do well with weight loss. She has extra challenges with celebration eating, but she still did well enough to lose weight.  Subjective:   1. Vitamin D deficiency Heather Hayes is on Vit D, and she is due to have labs checked.  2. Mixed hyperlipidemia Heather Hayes is working on diet and exercise. She is not on statin, and she is due for labs.  3. Insulin resistance Heather Hayes is doing well with diet and exercise, and she is due to have her labs rechecked.  4. Other depression, with emotional eating  Heather Hayes felt she has some cramping in her foot after starting Topamax. She stopped taking it and she hasn't had another episode. She is doing well minimizing emotional eating on her own.  5. At risk for heart disease Heather Hayes is at a higher than average risk for cardiovascular disease due to obesity.   Assessment/Plan:   1. Vitamin D deficiency Low Vitamin D level contributes to fatigue and are associated with obesity, breast, and colon cancer. We will check labs today. [name] will follow-up for routine testing of Vitamin D, at least 2-3 times per year to avoid over-replacement.  - VITAMIN D 25 Hydroxy (Vit-D Deficiency, Fractures)  2. Mixed hyperlipidemia Cardiovascular risk and specific lipid/LDL goals reviewed. We discussed several lifestyle modifications today. We will check labs today. [name] will continue to work on diet, exercise and weight loss efforts.  Orders and follow up as documented in patient record.   - Lipid Panel With LDL/HDL Ratio  3. Insulin resistance [name] will continue to work on weight loss, exercise, and decreasing simple carbohydrates to help decrease the risk of diabetes. We will check labs today. [name] agreed to follow-up with [name] as directed to closely monitor her progress.  - Comprehensive metabolic panel - Insulin, random - Hemoglobin A1c - CBC with Differential/Platelet  4. Other depression, with emotional eating  Behavior modification techniques were discussed today to help [name] deal with her emotional/non-hunger eating behaviors. Chairty agreed to discontinue Topamax, and we will continue to monitor. Orders and follow up as documented in patient record.   5. At risk for heart disease Heather Hayes was given approximately 15 minutes of coronary artery disease prevention counseling today. She is 60 y.o. female and has risk factors for heart disease including obesity. We discussed intensive lifestyle modifications today with an emphasis on specific weight loss instructions and strategies.   Repetitive spaced learning was employed today to elicit superior memory formation and behavioral change.  6. Class 1 obesity with serious comorbidity and body mass index (BMI) of 30.0 to 30.9 in adult, unspecified obesity type Heather Hayes is currently in the action stage of change. As such, her goal is to continue with weight loss efforts. She has agreed to the Category 2 Plan.   Exercise goals: As is.  Behavioral modification strategies: meal planning and cooking strategies.  Heather Hayes has agreed to follow-up with our clinic in 4  weeks. She was informed of the importance of frequent follow-up visits to maximize her success with intensive lifestyle modifications for her multiple health conditions.   Heather Hayes was informed we would discuss her lab results at her next visit unless there is a critical issue that needs to be addressed sooner. Heather Hayes agreed to keep  her next visit at the agreed upon time to discuss these results.  Objective:   Blood pressure 129/76, pulse 68, temperature 98.3 F (36.8 C), height 5\' 3"  (1.6 m), weight 165 lb (74.8 kg), SpO2 100 %. Body mass index is 29.23 kg/m.  General: Cooperative, alert, well developed, in no acute distress. HEENT: Conjunctivae and lids unremarkable. Cardiovascular: Regular rhythm.  Lungs: Normal work of breathing. Neurologic: No focal deficits.   Lab Results  Component Value Date   CREATININE 0.72 09/29/2020   BUN 20 09/29/2020   NA 136 09/29/2020   K 4.6 09/29/2020   CL 99 09/29/2020   CO2 23 09/29/2020   Lab Results  Component Value Date   ALT 23 09/29/2020   AST 19 09/29/2020   ALKPHOS 99 09/29/2020   BILITOT 0.4 09/29/2020   Lab Results  Component Value Date   HGBA1C 5.6 09/29/2020   HGBA1C 5.5 01/22/2020   HGBA1C 5.5 09/05/2019   HGBA1C 5.4 05/15/2019   Lab Results  Component Value Date   INSULIN WILL FOLLOW 09/29/2020   INSULIN 8.0 05/20/2020   INSULIN 7.5 01/22/2020   INSULIN 8.3 09/05/2019   INSULIN 14.9 05/15/2019   Lab Results  Component Value Date   TSH 1.560 05/15/2019   Lab Results  Component Value Date   CHOL 197 09/29/2020   HDL 64 09/29/2020   LDLCALC 116 (H) 09/29/2020   TRIG 94 09/29/2020   CHOLHDL 2.8 05/20/2020   Lab Results  Component Value Date   WBC 5.1 09/29/2020   HGB 13.7 09/29/2020   HCT 40.8 09/29/2020   MCV 92 09/29/2020   PLT 275 09/29/2020   No results found for: IRON, TIBC, FERRITIN  Attestation Statements:   Reviewed by clinician on day of visit: allergies, medications, problem list, medical history, surgical history, family history, social history, and previous encounter notes.   I, Trixie Dredge, am acting as transcriptionist for Dennard Nip, MD.  I have reviewed the above documentation for accuracy and completeness, and I agree with the above. -  Dennard Nip, MD

## 2020-10-08 DIAGNOSIS — Z13820 Encounter for screening for osteoporosis: Secondary | ICD-10-CM | POA: Diagnosis not present

## 2020-10-09 ENCOUNTER — Other Ambulatory Visit: Payer: Self-pay

## 2020-10-09 ENCOUNTER — Ambulatory Visit
Admission: RE | Admit: 2020-10-09 | Discharge: 2020-10-09 | Disposition: A | Discharge status: Home / Self Care | Payer: BC Managed Care – PPO | Source: Ambulatory Visit | Attending: Hematology | Admitting: Hematology

## 2020-10-09 DIAGNOSIS — Z853 Personal history of malignant neoplasm of breast: Secondary | ICD-10-CM

## 2020-10-09 DIAGNOSIS — R922 Inconclusive mammogram: Secondary | ICD-10-CM | POA: Diagnosis not present

## 2020-10-12 ENCOUNTER — Encounter: Payer: Self-pay | Admitting: Nurse Practitioner

## 2020-10-12 NOTE — Progress Notes (Addendum)
[place]   [phone] [phone]   Clinic Follow up Note   Patient Care Team: Molli Posey, MD as PCP - General (Obstetrics and Gynecology) Mauro Kaufmann, RN as Oncology Nurse Navigator Rockwell Germany, RN as Oncology Nurse Navigator Excell Seltzer, MD (Inactive) as Consulting Physician (General Surgery) Truitt Merle, MD as Consulting Physician (Hematology) Gery Pray, MD as Consulting Physician (Radiation Oncology) Alla Feeling, NP as Nurse Practitioner (Nurse Practitioner) 10/12/2020   I connected with Heather Hayes on 10/13/20 at  1:15 PM EDT by video enabled telemedicine visit and verified that I am speaking with the correct person using two identifiers.   I discussed the limitations, risks, security and privacy concerns of performing an evaluation and management service by telemedicine and the availability of in-person appointments. I also discussed with the patient that there may be a patient responsible charge related to this service. The patient expressed understanding and agreed to proceed.   Other persons participating in the visit and their role in the encounter: None  Patient's location: work  Provider's location: [place] office   CHIEF COMPLAINT: Follow up right breast cancer   SUMMARY OF ONCOLOGIC HISTORY: Oncology History Overview Note  Cancer Staging Malignant neoplasm of upper-outer quadrant of right breast in female, estrogen receptor positive ([place]) Staging form: Breast, AJCC 8th Edition - Clinical stage from 10/02/2018: Stage IA (cT1b, cN0, cM0, G2, ER+, PR+, HER2-) - Signed by Truitt Merle, MD on 10/10/2018 - Pathologic stage from 10/27/2018: Stage IA (pT1b, pN0, cM0, G1, ER+, PR+, HER2-) - Signed by Truitt Merle, MD on 02/26/2019     Malignant neoplasm of upper-outer quadrant of right breast in female, estrogen receptor positive ([place])  09/27/2018 Mammogram   [facility] Mammogram 09/27/18  IMPRESSION: 1. Suspicious  right breast mass measures 7 x 7 x 6 mm with associated vascularity at the 12 o'clock position 7 cm from the nipple. 2. No suspicious right axillary lymphadenopathy.   10/02/2018 Cancer Staging   Staging form: Breast, AJCC 8th Edition - Clinical stage from 10/02/2018: Stage IA (cT1b, cN0, cM0, G2, ER+, PR+, HER2-) - Signed by Truitt Merle, MD on 10/10/2018   10/02/2018 Initial Biopsy   Diagnosis 10/02/18 Breast, right, needle core biopsy, 12 o'clock - INVASIVE DUCTAL CARCINOMA.   10/02/2018 Receptors her2   Results: IMMUNOHISTOCHEMICAL AND MORPHOMETRIC ANALYSIS PERFORMED MANUALLY The tumor cells are NEGATIVE for Her2 (1+). Estrogen Receptor: 95%, POSITIVE, STRONG STAINING INTENSITY Progesterone Receptor: 95%, POSITIVE, STRONG STAINING INTENSITY Proliferation Marker Ki67: 5%   10/04/2018 Initial Diagnosis   Malignant neoplasm of upper-outer quadrant of right breast in female, estrogen receptor positive ([place])   10/27/2018 Surgery   RIGHT BREAST LUMPECTOMY WITH RADIOACTIVE SEED AND SENTINEL LYMPH NODE BIOPSY by Dr Excell Seltzer   10/27/2018 Pathology Results   Diagnosis 10/27/18 1. Breast, lumpectomy, Right - INVASIVE DUCTAL CARCINOMA WITH CALCIFICATIONS, GRADE I/III, SPANNING 0.7 CM. - THE SURGICAL RESECTION MARGIN ARE NEGATIVE FOR CARCINOMA. - SEE ONCOLOGY TABLE BELOW. 2. Lymph node, sentinel, biopsy, Right - THERE IS NO EVIDENCE OF CARCINOMA IN 1 OF 1 LYMPH NODE (0/1). 3. Lymph node, sentinel, biopsy, Right - THERE IS NO EVIDENCE OF CARCINOMA IN 1 OF 1 LYMPH NODE (0/1).    - 01/29/2019 Radiation Therapy   Radiation therapy at [facility] on 01/29/19.    10/27/2018 Cancer Staging   Staging form: Breast, AJCC 8th Edition - Pathologic stage from 10/27/2018: Stage IA (pT1b, pN0, cM0, G1, ER+, PR+, HER2-) - Signed by Truitt Merle, MD  on 02/26/2019   03/2019 -  Anti-estrogen oral therapy   Anastrozole 60m daily starting in 03/2019    07/09/2019 Survivorship   SCP delivered by LCira Rue NP       CURRENT THERAPY: Anastrozole 1 mg daily, starting 03/2019  INTERVAL HISTORY: Heather Hayes virtually for f/up as scheduled. She had recent labs per PCP, mammogram, and DEXA per Dr. HMatthew Saras She continues anastrozole, tolerating well.  She notes brittle nails.  In the morning she has pain in her hips and lower back that improves as the day progresses and she becomes more active.  Denies new unexplainable or persistent bone/joint pain.  Denies concerns in her breast such as new lump/mass, nipple discharge or inversion, or skin change.  She has tolerable hot flashes.  Denies mood fluctuation, vaginal or other bleeding.  Recent infection, or signs of thrombosis.  She continues working and follow-ups with other providers including age-appropriate cancer screenings.   MEDICAL HISTORY:  Past Medical History:  Diagnosis Date  . Anxiety   . Arthritis   . Back pain   . Bursitis   . Cancer ([facility]    right breast cancer 09-2018  . Constipation   . GERD (gastroesophageal reflux disease)    some heart burn from Arimidex  . History of radiation therapy    completed 01-29-2019  . Hyperlipidemia   . Joint pain   . Personal history of radiation therapy   . Rheumatic fever   . Swallowing difficulty     SURGICAL HISTORY: Past Surgical History:  Procedure Laterality Date  . BREAST LUMPECTOMY Right 10/27/2018  . BREAST LUMPECTOMY WITH RADIOACTIVE SEED AND SENTINEL LYMPH NODE BIOPSY Right 10/27/2018   Procedure: RIGHT BREAST LUMPECTOMY WITH RADIOACTIVE SEED AND SENTINEL LYMPH NODE BIOPSY;  Surgeon: HExcell Seltzer MD;  Location: [facility]  Service: General;  Laterality: Right;  . CERVICAL SPINE SURGERY  2009  . COLONOSCOPY    . ENDOMETRIAL ABLATION    . POLYPECTOMY    . TUBAL LIGATION    . tube ligation       I have reviewed the social history and family history with the patient and they are unchanged from previous note.  ALLERGIES:  is allergic to compazine  [prochlorperazine edisylate].  MEDICATIONS:  Current Outpatient Medications  Medication Sig Dispense Refill  . anastrozole (ARIMIDEX) 1 MG tablet Take 1 tablet (1 mg total) by mouth daily. 90 tablet 3  . Cholecalciferol (VITAMIN D-3) 125 MCG (5000 UT) TABS Take 1 tablet by mouth daily.    . clonazePAM (KLONOPIN) 0.5 MG tablet Take 0.5 mg by mouth 2 (two) times daily as needed for anxiety.    . meloxicam (MOBIC) 15 MG tablet Take 15 mg by mouth daily.     No current facility-administered medications for this visit.    PHYSICAL EXAMINATION: ECOG PERFORMANCE STATUS: 1 - Symptomatic but completely ambulatory  There were no vitals filed for this visit. There were no vitals filed for this visit.  Per video enabled software: GENERAL:alert, no distress and comfortable SKIN: No obvious rash to exposed skin on the face EYES: sclera clear LUNGS:  normal breathing effort NEURO: alert & oriented x 3 with fluent speech, no focal motor deficits Breast exam deferred, virtual visit  LABORATORY DATA:  I have reviewed the data as listed CBC Latest Ref Rng & Units 09/29/2020 04/25/2020 09/05/2019  WBC 3.4 - 10.8 x10E3/uL 5.1 9.2 5.4  Hemoglobin 11.1 - 15.9 g/dL 13.7 13.5 13.6  Hematocrit 34.0 -  46.6 % 40.8 41.5 41.1  Platelets 150 - 450 x10E3/uL 275 163 235     CMP Latest Ref Rng & Units 09/29/2020 05/20/2020 04/25/2020  Glucose 65 - 99 mg/dL 87 88 78  BUN 6 - 24 mg/dL _0 Creatinine 0.57 - 1.00 mg/dL 0.72 0.72 0.73  Sodium 134 - 144 mmol/L 136 140 138  Potassium 3.5 - 5.2 mmol/L 4.6 4.4 4.2  Chloride 96 - 106 mmol/L 99 100 104  CO2 20 - 29 mmol/L _1 Calcium 8.7 - 10.2 mg/dL 10.0 10.0 9.9  Total Protein 6.0 - 8.5 g/dL 7.2 7.5 7.9  Total Bilirubin 0.0 - 1.2 mg/dL 0.4 0.5 0.4  Alkaline Phos 44 - 121 IU/L 99 106 90  AST 0 - 40 IU/L _2 ALT 0 - 32 IU/L _3 RADIOGRAPHIC STUDIES: I have personally reviewed the radiological images as listed and agreed with the  findings in the report. No results found.   ASSESSMENT & PLAN: Heather Hayes is a 60 y.o. female with    1.Malignant neoplasm of upper-outer quadrant of right breast, StageIA,p(T1b,N0,M0), ER/PR+,HER2-,Grade II -Diagnosed in 10/2018, s/p right lumpectomy and radiation.  Given low-grade strongly ER positive disease, Oncotype was not recommended  -She began antiestrogen therapy with anastrozole on 03/2019, tolerating well with stable arthritic pain in the hips and back, hot flashes, and brittle nails.  I recommend a skin/hair/nail vitamin -I reviewed recent labs at PCP which are overall normal -I reviewed her recent mammogram which shows breast density category D, no evidence of malignancy.  Continue annual mammograms -There is no clinical concern for recurrence, continue surveillance and AI -Return for in person follow-up in 6 months  2. Bone Health  -Her 10/25/17 DEXA was normal with T-score of b/l hips.  -I discussed Anastrozole can reduce her bone density. -I received and reviewed her recent DEXA from Dr. Westley Hummer office, which is normal -Continue calcium and vitamin D, repeat in 2 years  3.  Genetic testing 2019, negative  4.  Arthritis and bursitis of her hips -Stable on AI, controlled with meloxicam -Denies new persistent bone/joint pain  Plan: -Reviewed recent labs, mammogram, DEXA reviewed -No clinical concern for breast cancer recurrence, continue surveillance -Continue anastrozole, refilled today -Continue calcium and vitamin D -Follow-up in 6 months   All questions were answered. The patient knows to call the clinic with any problems, questions or concerns. No barriers to learning were detected.  Total 15 minutes spent on today's call.     Alla Feeling, NP 10/12/20

## 2020-10-13 ENCOUNTER — Inpatient Hospital Stay: Payer: BC Managed Care – PPO | Attending: Nurse Practitioner | Admitting: Nurse Practitioner

## 2020-10-13 ENCOUNTER — Telehealth: Payer: Self-pay | Admitting: Nurse Practitioner

## 2020-10-13 ENCOUNTER — Encounter: Payer: Self-pay | Admitting: Nurse Practitioner

## 2020-10-13 DIAGNOSIS — Z170 Estrogen receptor positive status [ER+]: Secondary | ICD-10-CM | POA: Diagnosis not present

## 2020-10-13 DIAGNOSIS — C50411 Malignant neoplasm of upper-outer quadrant of right female breast: Secondary | ICD-10-CM | POA: Insufficient documentation

## 2020-10-13 DIAGNOSIS — Z79811 Long term (current) use of aromatase inhibitors: Secondary | ICD-10-CM | POA: Insufficient documentation

## 2020-10-13 DIAGNOSIS — Z79899 Other long term (current) drug therapy: Secondary | ICD-10-CM | POA: Insufficient documentation

## 2020-10-13 MED ORDER — ANASTROZOLE 1 MG PO TABS: 1.0000 mg | ORAL_TABLET | Freq: Every day | ORAL | 3 refills | Status: DC

## 2020-10-13 NOTE — Telephone Encounter (Signed)
Left message with follow-up appointment per 3/14 los. Gave option to call back to reschedule if needed.

## 2020-10-23 ENCOUNTER — Ambulatory Visit: Payer: BC Managed Care – PPO | Admitting: Nurse Practitioner

## 2020-10-23 ENCOUNTER — Other Ambulatory Visit: Payer: BC Managed Care – PPO

## 2020-10-29 ENCOUNTER — Encounter (INDEPENDENT_AMBULATORY_CARE_PROVIDER_SITE_OTHER): Payer: Self-pay | Admitting: Family Medicine

## 2020-10-29 ENCOUNTER — Other Ambulatory Visit: Payer: Self-pay

## 2020-10-29 ENCOUNTER — Ambulatory Visit (INDEPENDENT_AMBULATORY_CARE_PROVIDER_SITE_OTHER): Payer: BC Managed Care – PPO | Admitting: Family Medicine

## 2020-10-29 VITALS — BP 124/80 | HR 70 | Temp 98.3°F | Ht 63.0 in | Wt 166.0 lb

## 2020-10-29 DIAGNOSIS — E669 Obesity, unspecified: Secondary | ICD-10-CM | POA: Diagnosis not present

## 2020-10-29 DIAGNOSIS — Z6833 Body mass index (BMI) 33.0-33.9, adult: Secondary | ICD-10-CM

## 2020-10-29 DIAGNOSIS — E559 Vitamin D deficiency, unspecified: Secondary | ICD-10-CM | POA: Diagnosis not present

## 2020-11-05 NOTE — Progress Notes (Signed)
Chief Complaint:   OBESITY Heather Hayes is here to discuss her progress with her obesity treatment plan along with follow-up of her obesity related diagnoses. Heather Hayes is on the Category 2 Plan and states she is following her eating plan approximately 65% of the time. Heather Hayes states she is walking for 30-35 minutes 2 times per week.  Today's visit was #: 25 Starting weight: 191 lbs Starting date: 05/15/2019 Today's weight: 166 lbs Today's date: 10/29/2020 Total lbs lost to date: 25 Total lbs lost since last in-office visit: 0  Interim History: Heather Hayes continues to do well with maintaining her weight loss. Her hunger is controlled for the most part and she is mindful of her food choices.  Subjective:   1. Vitamin D deficiency Heather Hayes is on Vit D OTC, and she recently started Ca+ per her Onocologist. She states her bone density tests were within normal limits.  Assessment/Plan:   1. Vitamin D deficiency Low Vitamin D level contributes to fatigue and are associated with obesity, breast, and colon cancer. Heather Hayes agreed to continue taking OTC Vitamin D as is, and will watch for signs of over-replacement. She will follow-up for routine testing of Vitamin D, at least 2-3 times per year to avoid over-replacement.  2. Obesity BMI  today is 18 Heather Hayes is currently in the action stage of change. As such, her goal is to continue with weight loss efforts. She has agreed to the Category 2 Plan.   Exercise goals: As is.  Behavioral modification strategies: decreasing simple carbohydrates.  Heather Hayes has agreed to follow-up with our clinic in 8 weeks. She was informed of the importance of frequent follow-up visits to maximize her success with intensive lifestyle modifications for her multiple health conditions.   Objective:   Blood pressure 124/80, pulse 70, temperature 98.3 F (36.8 C), height 5\' 3"  (1.6 m), weight 166 lb (75.3 kg), SpO2 99 %. Body mass index is 29.41 kg/m.  General: Cooperative, alert, well  developed, in no acute distress. HEENT: Conjunctivae and lids unremarkable. Cardiovascular: Regular rhythm.  Lungs: Normal work of breathing. Neurologic: No focal deficits.   Lab Results  Component Value Date   CREATININE 0.72 09/29/2020   BUN 20 09/29/2020   NA 136 09/29/2020   K 4.6 09/29/2020   CL 99 09/29/2020   CO2 23 09/29/2020   Lab Results  Component Value Date   ALT 23 09/29/2020   AST 19 09/29/2020   ALKPHOS 99 09/29/2020   BILITOT 0.4 09/29/2020   Lab Results  Component Value Date   HGBA1C 5.6 09/29/2020   HGBA1C 5.5 01/22/2020   HGBA1C 5.5 09/05/2019   HGBA1C 5.4 05/15/2019   Lab Results  Component Value Date   INSULIN 6.5 09/29/2020   INSULIN 8.0 05/20/2020   INSULIN 7.5 01/22/2020   INSULIN 8.3 09/05/2019   INSULIN 14.9 05/15/2019   Lab Results  Component Value Date   TSH 1.560 05/15/2019   Lab Results  Component Value Date   CHOL 197 09/29/2020   HDL 64 09/29/2020   LDLCALC 116 (H) 09/29/2020   TRIG 94 09/29/2020   CHOLHDL 2.8 05/20/2020   Lab Results  Component Value Date   WBC 5.1 09/29/2020   HGB 13.7 09/29/2020   HCT 40.8 09/29/2020   MCV 92 09/29/2020   PLT 275 09/29/2020   No results found for: IRON, TIBC, FERRITIN  Attestation Statements:   Reviewed by clinician on day of visit: allergies, medications, problem list, medical history, surgical history, family history, social  history, and previous encounter notes.  Time spent on visit including pre-visit chart review and post-visit care and charting was 30 minutes.    I, Trixie Dredge, am acting as transcriptionist for Dennard Nip, MD.  I have reviewed the above documentation for accuracy and completeness, and I agree with the above. -  Dennard Nip, MD

## 2020-12-24 ENCOUNTER — Ambulatory Visit (INDEPENDENT_AMBULATORY_CARE_PROVIDER_SITE_OTHER): Payer: BC Managed Care – PPO | Admitting: Family Medicine

## 2020-12-31 DIAGNOSIS — Z01419 Encounter for gynecological examination (general) (routine) without abnormal findings: Secondary | ICD-10-CM | POA: Diagnosis not present

## 2020-12-31 DIAGNOSIS — Z6830 Body mass index (BMI) 30.0-30.9, adult: Secondary | ICD-10-CM | POA: Diagnosis not present

## 2021-01-01 ENCOUNTER — Encounter (INDEPENDENT_AMBULATORY_CARE_PROVIDER_SITE_OTHER): Payer: Self-pay | Admitting: Family Medicine

## 2021-01-01 LAB — CBC AND DIFFERENTIAL: Hemoglobin: 13.6 (ref 12.0–16.0)

## 2021-01-19 ENCOUNTER — Encounter (INDEPENDENT_AMBULATORY_CARE_PROVIDER_SITE_OTHER): Payer: Self-pay | Admitting: Family Medicine

## 2021-01-19 ENCOUNTER — Other Ambulatory Visit: Payer: Self-pay

## 2021-01-19 ENCOUNTER — Ambulatory Visit (INDEPENDENT_AMBULATORY_CARE_PROVIDER_SITE_OTHER): Payer: BC Managed Care – PPO | Admitting: Family Medicine

## 2021-01-19 VITALS — BP 140/82 | HR 70 | Temp 97.7°F | Ht 63.0 in | Wt 168.0 lb

## 2021-01-19 DIAGNOSIS — E669 Obesity, unspecified: Secondary | ICD-10-CM | POA: Diagnosis not present

## 2021-01-19 DIAGNOSIS — E559 Vitamin D deficiency, unspecified: Secondary | ICD-10-CM | POA: Diagnosis not present

## 2021-01-19 DIAGNOSIS — F419 Anxiety disorder, unspecified: Secondary | ICD-10-CM | POA: Diagnosis not present

## 2021-01-19 DIAGNOSIS — Z9189 Other specified personal risk factors, not elsewhere classified: Secondary | ICD-10-CM

## 2021-01-19 DIAGNOSIS — Z6833 Body mass index (BMI) 33.0-33.9, adult: Secondary | ICD-10-CM

## 2021-01-19 DIAGNOSIS — E66811 Obesity, class 1: Secondary | ICD-10-CM

## 2021-01-19 MED ORDER — ESCITALOPRAM OXALATE 10 MG PO TABS
10.0000 mg | ORAL_TABLET | Freq: Every day | ORAL | 1 refills | Status: DC
Start: 2021-01-19 — End: 2021-03-04

## 2021-01-20 NOTE — Progress Notes (Signed)
Chief Complaint:   OBESITY Heather Hayes is here to discuss her progress with her obesity treatment plan along with follow-up of her obesity related diagnoses. Heather Hayes is on the Category 2 Plan and states she is following her eating plan approximately 70% of the time. Heather Hayes states she is doing yard work for 15-20 minutes 7 times per week.  Today's visit was #: 75 Starting weight: 191 lbs Starting date: 05/15/2019 Today's weight: 168 lbs Today's date: 01/19/2021 Total lbs lost to date: 23 Total lbs lost since last in-office visit: 0  Interim History: Heather Hayes's last visit was approximately 3 months ago. She has done well mostly maintaining her weight, but she has had increased stressors in her family and her sleep has suffered. She would like more cold protein options since it is hot outside now.  Subjective:   1. Anxiety Heather Hayes is on Clonazepam for anxiety flare ups which has been helpful. She notes some increased emotional eating behaviors which is starting to affect her health.  2. Vitamin D deficiency Heather Hayes is stable on Vit D, and she denies nausea, vomiting, or muscle weakness.  3. At risk for impaired metabolic function Heather Hayes is at increased risk for impaired metabolic function with decrease in sleep.  Assessment/Plan:   1. Anxiety Behavior modification techniques were discussed today to help Kinzy deal with her anxiety. Heather Hayes agreed to start Lexapro 10 mg q daily with 1 refill. Orders and follow up as documented in patient record.   - escitalopram (LEXAPRO) 10 MG tablet; Take 1 tablet (10 mg total) by mouth daily.  Dispense: 30 tablet; Refill: 1  2. Vitamin D deficiency Low Vitamin D level contributes to fatigue and are associated with obesity, breast, and colon cancer. Heather Hayes will continue OTC Vitamin D and we will recheck labs at her next visit. She will follow-up for routine testing of Vitamin D, at least 2-3 times per year to avoid over-replacement.  3. At risk for impaired metabolic  function Heather Hayes was given approximately 15 minutes of impaired  metabolic function prevention counseling today. We discussed intensive lifestyle modifications today with an emphasis on specific nutrition and exercise instructions and strategies.   Repetitive spaced learning was employed today to elicit superior memory formation and behavioral change.  4. Obesity with current BMI 29.8 Heather Hayes is currently in the action stage of change. As such, her goal is to continue with weight loss efforts. She has agreed to the Category 2 Plan.   Exercise goals: As is.  Behavioral modification strategies: better snacking choices.  Heather Hayes has agreed to follow-up with our clinic in 7 weeks. She was informed of the importance of frequent follow-up visits to maximize her success with intensive lifestyle modifications for her multiple health conditions.   Objective:   Blood pressure 140/82, pulse 70, temperature 97.7 F (36.5 C), height 5\' 3"  (1.6 m), weight 168 lb (76.2 kg), SpO2 99 %. Body mass index is 29.76 kg/m.  General: Cooperative, alert, well developed, in no acute distress. HEENT: Conjunctivae and lids unremarkable. Cardiovascular: Regular rhythm.  Lungs: Normal work of breathing. Neurologic: No focal deficits.   Lab Results  Component Value Date   CREATININE 0.72 09/29/2020   BUN 20 09/29/2020   NA 136 09/29/2020   K 4.6 09/29/2020   CL 99 09/29/2020   CO2 23 09/29/2020   Lab Results  Component Value Date   ALT 23 09/29/2020   AST 19 09/29/2020   ALKPHOS 99 09/29/2020   BILITOT 0.4 09/29/2020  Lab Results  Component Value Date   HGBA1C 5.6 09/29/2020   HGBA1C 5.5 01/22/2020   HGBA1C 5.5 09/05/2019   HGBA1C 5.4 05/15/2019   Lab Results  Component Value Date   INSULIN 6.5 09/29/2020   INSULIN 8.0 05/20/2020   INSULIN 7.5 01/22/2020   INSULIN 8.3 09/05/2019   INSULIN 14.9 05/15/2019   Lab Results  Component Value Date   TSH 1.560 05/15/2019   Lab Results  Component Value  Date   CHOL 197 09/29/2020   HDL 64 09/29/2020   LDLCALC 116 (H) 09/29/2020   TRIG 94 09/29/2020   CHOLHDL 2.8 05/20/2020   Lab Results  Component Value Date   WBC 5.1 09/29/2020   HGB 13.6 01/01/2021   HCT 40.8 09/29/2020   MCV 92 09/29/2020   PLT 275 09/29/2020   No results found for: IRON, TIBC, FERRITIN  Attestation Statements:   Reviewed by clinician on day of visit: allergies, medications, problem list, medical history, surgical history, family history, social history, and previous encounter notes.   I, Trixie Dredge, am acting as transcriptionist for Dennard Nip, MD.  I have reviewed the above documentation for accuracy and completeness, and I agree with the above. -  Dennard Nip, MD

## 2021-02-11 ENCOUNTER — Other Ambulatory Visit (INDEPENDENT_AMBULATORY_CARE_PROVIDER_SITE_OTHER): Payer: Self-pay | Admitting: Family Medicine

## 2021-02-11 DIAGNOSIS — F419 Anxiety disorder, unspecified: Secondary | ICD-10-CM

## 2021-02-11 NOTE — Telephone Encounter (Signed)
Pt last seen by Dr. Hohmann.  

## 2021-02-12 NOTE — Telephone Encounter (Signed)
LAST APPOINTMENT DATE: 01/19/2021  NEXT APPOINTMENT DATE: 03/04/2021   [id number] - [place], [place] [address] Phone: [phone] Fax: [phone]  Patient is requesting a refill of the following medications: Requested Prescriptions   Pending Prescriptions Disp Refills   escitalopram (LEXAPRO) 10 MG tablet [Pharmacy Med Name: ESCITALOPRAM 10 MG TABLET] 90 tablet 1    Sig: TAKE 1 TABLET BY MOUTH EVERY DAY    Date last filled: 01/29/21 Previously prescribed by [facility]  Lab Results  Component Value Date   HGBA1C 5.6 09/29/2020   HGBA1C 5.5 01/22/2020   HGBA1C 5.5 09/05/2019   Lab Results  Component Value Date   LDLCALC 116 (H) 09/29/2020   CREATININE 0.72 09/29/2020   Lab Results  Component Value Date   VD25OH 77.6 09/29/2020   VD25OH 51.7 01/22/2020   VD25OH 43.7 09/05/2019    BP Readings from Last 3 Encounters:  01/19/21 140/82  10/29/20 124/80  09/29/20 129/76

## 2021-02-25 ENCOUNTER — Telehealth: Payer: Self-pay | Admitting: Nurse Practitioner

## 2021-02-25 NOTE — Telephone Encounter (Signed)
Rescheduled upcoming appointment due to provider's template. Patient is aware of changes. 

## 2021-02-26 DIAGNOSIS — M542 Cervicalgia: Secondary | ICD-10-CM | POA: Diagnosis not present

## 2021-03-04 ENCOUNTER — Encounter (INDEPENDENT_AMBULATORY_CARE_PROVIDER_SITE_OTHER): Payer: Self-pay | Admitting: Family Medicine

## 2021-03-04 ENCOUNTER — Other Ambulatory Visit: Payer: Self-pay

## 2021-03-04 ENCOUNTER — Ambulatory Visit (INDEPENDENT_AMBULATORY_CARE_PROVIDER_SITE_OTHER): Payer: BC Managed Care – PPO | Admitting: Family Medicine

## 2021-03-04 VITALS — BP 125/77 | HR 78 | Temp 97.6°F | Ht 63.0 in | Wt 168.0 lb

## 2021-03-04 DIAGNOSIS — F419 Anxiety disorder, unspecified: Secondary | ICD-10-CM

## 2021-03-04 DIAGNOSIS — Z6833 Body mass index (BMI) 33.0-33.9, adult: Secondary | ICD-10-CM

## 2021-03-04 DIAGNOSIS — Z9189 Other specified personal risk factors, not elsewhere classified: Secondary | ICD-10-CM

## 2021-03-04 DIAGNOSIS — E669 Obesity, unspecified: Secondary | ICD-10-CM

## 2021-03-04 MED ORDER — ESCITALOPRAM OXALATE 10 MG PO TABS
10.0000 mg | ORAL_TABLET | Freq: Every day | ORAL | 0 refills | Status: DC
Start: 2021-03-04 — End: 2021-04-01

## 2021-03-04 NOTE — Patient Instructions (Signed)
Health Maintenance Due  Topic Date Due   COVID-19 Vaccine (1) Never done   Pneumococcal Vaccine 54-60 Years old (1 - PCV) Never done   HIV Screening  Never done   Hepatitis C Screening  Never done   TETANUS/TDAP  Never done   Zoster Vaccines- Shingrix (1 of 2) Never done   PAP SMEAR-Modifier  Never done   INFLUENZA VACCINE  03/02/2021    Depression screen PHQ 2/9 05/15/2019  Decreased Interest 1  Down, Depressed, Hopeless 1  PHQ - 2 Score 2  Altered sleeping 3  Tired, decreased energy 2  Change in appetite 2  Feeling bad or failure about yourself  2  Trouble concentrating 1  Moving slowly or fidgety/restless 1  Suicidal thoughts 0  PHQ-9 Score 13  Difficult doing work/chores Somewhat difficult

## 2021-03-05 NOTE — Progress Notes (Signed)
Chief Complaint:   OBESITY Heather Hayes is here to discuss her progress with her obesity treatment plan along with follow-up of her obesity related diagnoses. Heather Hayes is on the Category 2 Plan and states she is following her eating plan approximately 65% of the time. Heather Hayes states she is walking for 30 minutes 2-3 times per week.  Today's visit was #: 3 Starting weight: 191 lbs Starting date: 05/15/2019 Today's weight: 168 lbs Today's date: 03/04/2021 Total lbs lost to date: 23 Total lbs lost since last in-office visit: 0  Interim History: [name] continues to maintain her weight loss well. She is still mindful of her food choices, but she is drifting away from her plan a bit more. She is going on a vacation soon.  Subjective:   1. Anxiety Heather Hayes is stable on Lexapro.  2. At risk for dehydration Heather Hayes is at risk for dehydration due to inadequate water intake.  Assessment/Plan:   1. Anxiety Behavior modification techniques were discussed today to help Heather Hayes deal with her anxiety. We will refill Lexapro for 1 month. Orders and follow up as documented in patient record.   - escitalopram (LEXAPRO) 10 MG tablet; Take 1 tablet (10 mg total) by mouth daily.  Dispense: 30 tablet; Refill: 0  2. At risk for dehydration Heather Hayes was given approximately 15 minutes dehydration prevention counseling today. Heather Hayes is at risk for dehydration due to weight loss and current medication(s). She was encouraged to hydrate and monitor fluid status to avoid dehydration as well as weight loss plateaus.   3. Obesity with current BMI 29.8 Heather Hayes is currently in the action stage of change. As such, her goal is to continue with weight loss efforts. She has agreed to the Category 2 Plan.   Exercise goals: As is.  Behavioral modification strategies: increasing lean protein intake, increasing water intake, and travel eating strategies.  Heather Hayes has agreed to follow-up with our clinic in 4 weeks. She was informed of the importance  of frequent follow-up visits to maximize her success with intensive lifestyle modifications for her multiple health conditions.   Objective:   Blood pressure 125/77, pulse 78, temperature 97.6 F (36.4 C), height '5\' 3"'$  (1.6 m), weight 168 lb (76.2 kg), SpO2 99 %. Body mass index is 29.76 kg/m.  General: Cooperative, alert, well developed, in no acute distress. HEENT: Conjunctivae and lids unremarkable. Cardiovascular: Regular rhythm.  Lungs: Normal work of breathing. Neurologic: No focal deficits.   Lab Results  Component Value Date   CREATININE 0.72 09/29/2020   BUN 20 09/29/2020   NA 136 09/29/2020   K 4.6 09/29/2020   CL 99 09/29/2020   CO2 23 09/29/2020   Lab Results  Component Value Date   ALT 23 09/29/2020   AST 19 09/29/2020   ALKPHOS 99 09/29/2020   BILITOT 0.4 09/29/2020   Lab Results  Component Value Date   HGBA1C 5.6 09/29/2020   HGBA1C 5.5 01/22/2020   HGBA1C 5.5 09/05/2019   HGBA1C 5.4 05/15/2019   Lab Results  Component Value Date   INSULIN 6.5 09/29/2020   INSULIN 8.0 05/20/2020   INSULIN 7.5 01/22/2020   INSULIN 8.3 09/05/2019   INSULIN 14.9 05/15/2019   Lab Results  Component Value Date   TSH 1.560 05/15/2019   Lab Results  Component Value Date   CHOL 197 09/29/2020   HDL 64 09/29/2020   LDLCALC 116 (H) 09/29/2020   TRIG 94 09/29/2020   CHOLHDL 2.8 05/20/2020   Lab Results  Component Value  Date   VD25OH 77.6 09/29/2020   VD25OH 51.7 01/22/2020   VD25OH 43.7 09/05/2019   Lab Results  Component Value Date   WBC 5.1 09/29/2020   HGB 13.6 01/01/2021   HCT 40.8 09/29/2020   MCV 92 09/29/2020   PLT 275 09/29/2020   No results found for: IRON, TIBC, FERRITIN  Attestation Statements:   Reviewed by clinician on day of visit: allergies, medications, problem list, medical history, surgical history, family history, social history, and previous encounter notes.   I, Trixie Dredge, am acting as transcriptionist for Dennard Nip,  MD.  I have reviewed the above documentation for accuracy and completeness, and I agree with the above. -  Dennard Nip, MD

## 2021-04-01 ENCOUNTER — Other Ambulatory Visit: Payer: Self-pay

## 2021-04-01 ENCOUNTER — Ambulatory Visit (INDEPENDENT_AMBULATORY_CARE_PROVIDER_SITE_OTHER): Payer: BC Managed Care – PPO | Admitting: Family Medicine

## 2021-04-01 ENCOUNTER — Encounter (INDEPENDENT_AMBULATORY_CARE_PROVIDER_SITE_OTHER): Payer: Self-pay | Admitting: Family Medicine

## 2021-04-01 VITALS — BP 126/89 | HR 85 | Temp 97.9°F | Ht 63.0 in | Wt 170.0 lb

## 2021-04-01 DIAGNOSIS — E559 Vitamin D deficiency, unspecified: Secondary | ICD-10-CM | POA: Diagnosis not present

## 2021-04-01 DIAGNOSIS — Z6833 Body mass index (BMI) 33.0-33.9, adult: Secondary | ICD-10-CM

## 2021-04-01 DIAGNOSIS — E669 Obesity, unspecified: Secondary | ICD-10-CM | POA: Diagnosis not present

## 2021-04-01 DIAGNOSIS — F3289 Other specified depressive episodes: Secondary | ICD-10-CM | POA: Diagnosis not present

## 2021-04-01 DIAGNOSIS — Z9189 Other specified personal risk factors, not elsewhere classified: Secondary | ICD-10-CM

## 2021-04-01 MED ORDER — ESCITALOPRAM OXALATE 10 MG PO TABS: 10.0000 mg | ORAL_TABLET | Freq: Every day | ORAL | 0 refills | Status: DC

## 2021-04-01 NOTE — Progress Notes (Signed)
Chief Complaint:   OBESITY Miosha is here to discuss her progress with her obesity treatment plan along with follow-up of her obesity related diagnoses. Allessandra is on the Category 2 Plan and states she is following her eating plan approximately 80% of the time. Vinda states she is doing 0 minutes 0 times per week.  Today's visit was #: 28 Starting weight: 191 lbs Starting date: 05/15/2019 Today's weight: 170 lbs Today's date: 04/01/2021 Total lbs lost to date: 21 Total lbs lost since last in-office visit: 0  Interim History: Sahar was on vacation since her last visit and she did some celebration eating. She has already gotten back on track, and she has gone back to weight loss.  Subjective:   1. Vitamin D deficiency Kameko is stable on Vit D, and she denies signs of over-replacement.   2. Other depression, with emotional eating  Maghan is doing well recognizing emotional eating behavior, and she is working on making strategies to reduce this. She is stable on Lexapro.  3. At risk for impaired metabolic function Christine is at increased risk for impaired metabolic function if calories drop too low.  Assessment/Plan:   1. Vitamin D deficiency Low Vitamin D level contributes to fatigue and are associated with obesity, breast, and colon cancer. Marwah will continue Vitamin D 5,000 IU q daily and we will recheck labs in 1-2 months. She will follow-up for routine testing of Vitamin D, at least 2-3 times per year to avoid over-replacement.  2. Other depression, with emotional eating  Behavior modification techniques were discussed today to help Mykia deal with her emotional/non-hunger eating behaviors. We will refill Lexapro for 1 month. Orders and follow up as documented in patient record.   - escitalopram (LEXAPRO) 10 MG tablet; Take 1 tablet (10 mg total) by mouth daily.  Dispense: 30 tablet; Refill: 0  3. At risk for impaired metabolic function Brealynn was given approximately 15 minutes of  impaired  metabolic function prevention counseling today. We discussed intensive lifestyle modifications today with an emphasis on specific nutrition and exercise instructions and strategies.   Repetitive spaced learning was employed today to elicit superior memory formation and behavioral change.  4. Obesity with current BMI 30.3 Kathalyn is currently in the action stage of change. As such, her goal is to continue with weight loss efforts. She has agreed to the Category 2 Plan.   Behavioral modification strategies: increasing lean protein intake.  Eaden has agreed to follow-up with our clinic in 3 to 4 weeks. She was informed of the importance of frequent follow-up visits to maximize her success with intensive lifestyle modifications for her multiple health conditions.   Objective:   Blood pressure 126/89, pulse 85, temperature 97.9 F (36.6 C), height '5\' 3"'$  (1.6 m), weight 170 lb (77.1 kg), SpO2 98 %. Body mass index is 30.11 kg/m.  General: Cooperative, alert, well developed, in no acute distress. HEENT: Conjunctivae and lids unremarkable. Cardiovascular: Regular rhythm.  Lungs: Normal work of breathing. Neurologic: No focal deficits.   Lab Results  Component Value Date   CREATININE 0.72 09/29/2020   BUN 20 09/29/2020   NA 136 09/29/2020   K 4.6 09/29/2020   CL 99 09/29/2020   CO2 23 09/29/2020   Lab Results  Component Value Date   ALT 23 09/29/2020   AST 19 09/29/2020   ALKPHOS 99 09/29/2020   BILITOT 0.4 09/29/2020   Lab Results  Component Value Date   HGBA1C 5.6 09/29/2020  HGBA1C 5.5 01/22/2020   HGBA1C 5.5 09/05/2019   HGBA1C 5.4 05/15/2019   Lab Results  Component Value Date   INSULIN 6.5 09/29/2020   INSULIN 8.0 05/20/2020   INSULIN 7.5 01/22/2020   INSULIN 8.3 09/05/2019   INSULIN 14.9 05/15/2019   Lab Results  Component Value Date   TSH 1.560 05/15/2019   Lab Results  Component Value Date   CHOL 197 09/29/2020   HDL 64 09/29/2020   LDLCALC 116  (H) 09/29/2020   TRIG 94 09/29/2020   CHOLHDL 2.8 05/20/2020   Lab Results  Component Value Date   VD25OH 77.6 09/29/2020   VD25OH 51.7 01/22/2020   VD25OH 43.7 09/05/2019   Lab Results  Component Value Date   WBC 5.1 09/29/2020   HGB 13.6 01/01/2021   HCT 40.8 09/29/2020   MCV 92 09/29/2020   PLT 275 09/29/2020   No results found for: IRON, TIBC, FERRITIN  Attestation Statements:   Reviewed by clinician on day of visit: allergies, medications, problem list, medical history, surgical history, family history, social history, and previous encounter notes.   I, Trixie Dredge, am acting as transcriptionist for Dennard Nip, MD.  I have reviewed the above documentation for accuracy and completeness, and I agree with the above. -  Dennard Nip, MD

## 2021-04-09 ENCOUNTER — Encounter (INDEPENDENT_AMBULATORY_CARE_PROVIDER_SITE_OTHER): Payer: Self-pay | Admitting: Family Medicine

## 2021-04-09 NOTE — Telephone Encounter (Signed)
Ok to rf x 90 days

## 2021-04-10 ENCOUNTER — Other Ambulatory Visit (INDEPENDENT_AMBULATORY_CARE_PROVIDER_SITE_OTHER): Payer: Self-pay | Admitting: Emergency Medicine

## 2021-04-10 DIAGNOSIS — F3289 Other specified depressive episodes: Secondary | ICD-10-CM

## 2021-04-10 MED ORDER — ESCITALOPRAM OXALATE 10 MG PO TABS: 10.0000 mg | ORAL_TABLET | Freq: Every day | ORAL | 0 refills | Status: DC

## 2021-04-14 ENCOUNTER — Inpatient Hospital Stay: Payer: BC Managed Care – PPO | Attending: Nurse Practitioner | Admitting: Nurse Practitioner

## 2021-04-14 ENCOUNTER — Encounter: Payer: Self-pay | Admitting: Nurse Practitioner

## 2021-04-14 ENCOUNTER — Other Ambulatory Visit: Payer: Self-pay

## 2021-04-14 VITALS — BP 150/81 | HR 60 | Temp 97.9°F | Resp 18 | Ht 63.0 in | Wt 177.4 lb

## 2021-04-14 DIAGNOSIS — F419 Anxiety disorder, unspecified: Secondary | ICD-10-CM | POA: Diagnosis not present

## 2021-04-14 DIAGNOSIS — M2560 Stiffness of unspecified joint, not elsewhere classified: Secondary | ICD-10-CM | POA: Insufficient documentation

## 2021-04-14 DIAGNOSIS — Z79811 Long term (current) use of aromatase inhibitors: Secondary | ICD-10-CM | POA: Insufficient documentation

## 2021-04-14 DIAGNOSIS — C50411 Malignant neoplasm of upper-outer quadrant of right female breast: Secondary | ICD-10-CM

## 2021-04-14 DIAGNOSIS — Z170 Estrogen receptor positive status [ER+]: Secondary | ICD-10-CM | POA: Diagnosis not present

## 2021-04-14 DIAGNOSIS — Z79899 Other long term (current) drug therapy: Secondary | ICD-10-CM | POA: Diagnosis not present

## 2021-04-14 DIAGNOSIS — R6882 Decreased libido: Secondary | ICD-10-CM | POA: Insufficient documentation

## 2021-04-14 DIAGNOSIS — K5900 Constipation, unspecified: Secondary | ICD-10-CM | POA: Insufficient documentation

## 2021-04-14 DIAGNOSIS — Z923 Personal history of irradiation: Secondary | ICD-10-CM | POA: Insufficient documentation

## 2021-04-14 MED ORDER — EXEMESTANE 25 MG PO TABS: 25.0000 mg | ORAL_TABLET | Freq: Every day | ORAL | 6 refills | Status: DC

## 2021-04-14 NOTE — Progress Notes (Signed)
[facility]   [phone] [phone]   Clinic Follow up Note   Patient Care Team: Molli Posey, MD as PCP - General (Obstetrics and Gynecology) Mauro Kaufmann, RN as Oncology Nurse Navigator Rockwell Germany, RN as Oncology Nurse Navigator Excell Seltzer, MD (Inactive) as Consulting Physician (General Surgery) Truitt Merle, MD as Consulting Physician (Hematology) Gery Pray, MD as Consulting Physician (Radiation Oncology) Alla Feeling, NP as Nurse Practitioner (Nurse Practitioner) 04/14/2021  CHIEF COMPLAINT: Follow-up right breast cancer  SUMMARY OF ONCOLOGIC HISTORY: Oncology History Overview Note  Cancer Staging Malignant neoplasm of upper-outer quadrant of right breast in female, estrogen receptor positive ([place]) Staging form: Breast, AJCC 8th Edition - Clinical stage from 10/02/2018: Stage IA (cT1b, cN0, cM0, G2, ER+, PR+, HER2-) - Signed by Truitt Merle, MD on 10/10/2018 - Pathologic stage from 10/27/2018: Stage IA (pT1b, pN0, cM0, G1, ER+, PR+, HER2-) - Signed by Truitt Merle, MD on 02/26/2019     Malignant neoplasm of upper-outer quadrant of right breast in female, estrogen receptor positive ([place])  09/27/2018 Mammogram   [facility] Mammogram 09/27/18  IMPRESSION: 1. Suspicious right breast mass measures 7 x 7 x 6 mm with associated vascularity at the 12 o'clock position 7 cm from the nipple. 2. No suspicious right axillary lymphadenopathy.   10/02/2018 Cancer Staging   Staging form: Breast, AJCC 8th Edition - Clinical stage from 10/02/2018: Stage IA (cT1b, cN0, cM0, G2, ER+, PR+, HER2-) - Signed by Truitt Merle, MD on 10/10/2018   10/02/2018 Initial Biopsy   Diagnosis 10/02/18 Breast, right, needle core biopsy, 12 o'clock - INVASIVE DUCTAL CARCINOMA.   10/02/2018 Receptors her2   Results: IMMUNOHISTOCHEMICAL AND MORPHOMETRIC ANALYSIS PERFORMED MANUALLY The tumor cells are NEGATIVE for Her2 (1+). Estrogen Receptor: 95%, POSITIVE, STRONG  STAINING INTENSITY Progesterone Receptor: 95%, POSITIVE, STRONG STAINING INTENSITY Proliferation Marker Ki67: 5%   10/04/2018 Initial Diagnosis   Malignant neoplasm of upper-outer quadrant of right breast in female, estrogen receptor positive ([place])   10/27/2018 Surgery   RIGHT BREAST LUMPECTOMY WITH RADIOACTIVE SEED AND SENTINEL LYMPH NODE BIOPSY by Dr Excell Seltzer   10/27/2018 Pathology Results   Diagnosis 10/27/18 1. Breast, lumpectomy, Right - INVASIVE DUCTAL CARCINOMA WITH CALCIFICATIONS, GRADE I/III, SPANNING 0.7 CM. - THE SURGICAL RESECTION MARGIN ARE NEGATIVE FOR CARCINOMA. - SEE ONCOLOGY TABLE BELOW. 2. Lymph node, sentinel, biopsy, Right - THERE IS NO EVIDENCE OF CARCINOMA IN 1 OF 1 LYMPH NODE (0/1). 3. Lymph node, sentinel, biopsy, Right - THERE IS NO EVIDENCE OF CARCINOMA IN 1 OF 1 LYMPH NODE (0/1).    - 01/29/2019 Radiation Therapy   Rad[facility] at [facility] on 01/29/19.    10/27/2018 Cancer Staging   Staging form: Breast, AJCC 8th Edition - Pathologic stage from 10/27/2018: Stage IA (pT1b, pN0, cM0, G1, ER+, PR+, HER2-) - Signed by Truitt Merle, MD on 02/26/2019   03/2019 -  Anti-estrogen oral therapy   Anastrozole 43m daily starting in 03/2019    07/09/2019 Survivorship   SCP delivered by LCira Rue NP      CURRENT THERAPY: Anastrozole 1 mg daily starting 03/2019  INTERVAL HISTORY: Ms. BHigleyreturns for follow-up as scheduled.  She was last seen in person by Dr. FBurr Medico9/24/2021 and virtually by me 10/13/2020.  She is doing well, denies changes in her health since last visit.  She continues anastrozole.  She has occasional hot flash.  Mood is good, denies depression.  She has increased joint stiffness mainly in her back and lower body,  more in the mornings and with certain activities.  She describes this as moderate.  She takes Mobic which is partially effective.  She has been less active.  She continues weight management with Dr. Leafy Ro.  Also has low libido, no  interest in sex with her husband. Denies concerns in her breast such as new lump/mass, nipple discharge or inversion, or skin change.  Manages constipation with as needed MiraLAX.  She is up-to-date on shingles and COVID vaccines, plans to get flu shot next month.  She continues follow-up with Heather Hayes.   MEDICAL HISTORY:  Past Medical History:  Diagnosis Date   Anxiety    Arthritis    Back pain    Bursitis    Cancer ([place])    right breast cancer 09-2018   Constipation    GERD (gastroesophageal reflux disease)    some heart burn from Arimidex   History of radiation therapy    completed 01-29-2019   Hyperlipidemia    Joint pain    Personal history of radiation therapy    Rheumatic fever    Swallowing difficulty     SURGICAL HISTORY: Past Surgical History:  Procedure Laterality Date   BREAST LUMPECTOMY Right 10/27/2018   BREAST LUMPECTOMY WITH RADIOACTIVE SEED AND SENTINEL LYMPH NODE BIOPSY Right 10/27/2018   Procedure: RIGHT BREAST LUMPECTOMY WITH RADIOACTIVE SEED AND SENTINEL LYMPH NODE BIOPSY;  Surgeon: Excell Seltzer, MD;  Location: [facility];  Service: General;  Laterality: Right;   CERVICAL SPINE SURGERY  2009   COLONOSCOPY     ENDOMETRIAL ABLATION     POLYPECTOMY     TUBAL LIGATION     tube ligation       I have reviewed the social history and family history with the patient and they are unchanged from previous note.  ALLERGIES:  is allergic to compazine [prochlorperazine edisylate].  MEDICATIONS:  Current Outpatient Medications  Medication Sig Dispense Refill   exemestane (AROMASIN) 25 MG tablet Take 1 tablet (25 mg total) by mouth daily after breakfast. 30 tablet 6   Cholecalciferol (VITAMIN D-3) 125 MCG (5000 UT) TABS Take 1 tablet by mouth daily.     clonazePAM (KLONOPIN) 0.5 MG tablet Take 0.5 mg by mouth 2 (two) times daily as needed for anxiety.     escitalopram (LEXAPRO) 10 MG tablet Take 1 tablet (10 mg total) by mouth daily. 90 tablet  0   meloxicam (MOBIC) 15 MG tablet Take 15 mg by mouth daily.     No current facility-administered medications for this visit.    PHYSICAL EXAMINATION: ECOG PERFORMANCE STATUS: 1 - Symptomatic but completely ambulatory  Vitals:   04/14/21 1055  BP: (!) 150/81  Pulse: 60  Resp: 18  Temp: 97.9 F (36.6 C)  SpO2: 98%   Filed Weights   04/14/21 1055  Weight: 177 lb 6.4 oz (80.5 kg)    GENERAL:alert, no distress and comfortable SKIN: No rash EYES: sclera clear NECK: Without mass LYMPH:  no palpable cervical or supraclavicular lymphadenopathy  LUNGS: normal breathing effort HEART: no lower extremity edema ABDOMEN:abdomen soft, non-tender and normal bowel sounds Musculoskeletal: Nonfocal NEURO: alert & oriented x 3 with fluent speech, no focal motor/sensory deficits Breast exam: Breasts are symmetric without nipple discharge or inversion.  S/p right lumpectomy, incisions completely healed.  Stable 1 cm lump at the 12:00 position.  No other palpable mass or nodularity in either breast or axilla that I could appreciate.  LABORATORY DATA:  I have reviewed the data as  listed CBC Latest Ref Rng & Units 01/01/2021 09/29/2020 04/25/2020  WBC 3.4 - 10.8 x10E3/uL - 5.1 9.2  Hemoglobin 12.0 - 16.0 13.6 13.7 13.5  Hematocrit 34.0 - 46.6 % - 40.8 41.5  Platelets 150 - 450 x10E3/uL - 275 163     CMP Latest Ref Rng & Units 09/29/2020 05/20/2020 04/25/2020  Glucose 65 - 99 mg/dL 87 88 78  BUN 6 - 24 mg/dL _0 Creatinine 0.57 - 1.00 mg/dL 0.72 0.72 0.73  Sodium 134 - 144 mmol/L 136 140 138  Potassium 3.5 - 5.2 mmol/L 4.6 4.4 4.2  Chloride 96 - 106 mmol/L 99 100 104  CO2 20 - 29 mmol/L _1 Calcium 8.7 - 10.2 mg/dL 10.0 10.0 9.9  Total Protein 6.0 - 8.5 g/dL 7.2 7.5 7.9  Total Bilirubin 0.0 - 1.2 mg/dL 0.4 0.5 0.4  Alkaline Phos 44 - 121 IU/L 99 106 90  AST 0 - 40 IU/L _2 ALT 0 - 32 IU/L _3 RADIOGRAPHIC STUDIES: I have personally reviewed the  radiological images as listed and agreed with the findings in the report. No results found.   ASSESSMENT & PLAN: KENTLEY CEDILLO is a 60 y.o. female with      1. Malignant neoplasm of upper-outer quadrant of right breast, Stage IA, p (T1b,N0,M0), ER/PR+, HER2-, Grade II -Diagnosed in 10/2018, s/p right lumpectomy and radiation.  Given low-grade strongly ER positive disease, Oncotype was not recommended  -She began antiestrogen therapy with anastrozole on 03/2019, tolerated well with stable arthritic pain in the hips and back, hot flashes, and brittle nails.   -Breast density is category D, mammogram 10/09/2020 negative -Joint pain mainly in her lower back and hips/low body has worsened, she also has low libido.  Plan to switch anastrozole to exemestane starting in October -We briefly discussed adding screening breast MRI in the future.  Continue mammograms for now.  Diagnostic mammograms are $600, will change back to 3D screening given she is over 3 years from her diagnosis. -There is no clinical concern for recurrence, continue surveillance and AI -Phone med check in 3 months, return for in person follow-up in 6 months   2. Bone Health  -Her 10/25/17 and 10/08/2020 DEXA scans were normal  -She is aware of AI's can reduce her bone density. -Continue calcium, vitamin D, and weightbearing exercise repeat in 2 years   3.  Genetic testing 2019, negative   4.  Arthritis and bursitis of her hips -Previously stable on AI, controlled with meloxicam -Worsened lately, limiting some activities  -Plan to discontinue anastrozole from today 04/14/2021 and start exemestane 05/02/2021   Disposition: Ms. Maret is clinically doing well.  Tolerating anastrozole with some side effects mainly worsening joint stiffness and low libido.  We discussed management.  I recommend to discontinue anastrozole, wait 3 weeks, then begin exemestane October 1.  Breast exam shows stable scar tissue, benign.  Mammogram 09/2020 is  negative.  Overall there is no clinical concern for breast cancer recurrence.  Continue antiestrogen and surveillance.  I encouraged her to continue healthy lifestyle, physical exercise, healthy diet/hydration, avoiding smoking, limiting alcohol, and staying up-to-date on age-appropriate vaccines and cancer screenings.  She will get labs soon at weight management follow-up.  The plan is for a phone follow-up in 3 months for exemestane check, 3D screening mammogram 09/2020, and follow-up in 6 months.  Orders Placed This Encounter  Procedures   MM  3D SCREEN BREAST BILATERAL    Standing Status:   Future    Standing Expiration Date:   04/14/2022    Order Specific Question:   Reason for Exam (SYMPTOM  OR DIAGNOSIS REQUIRED)    Answer:   R breast cancer 10/2018    Order Specific Question:   Is the patient pregnant?    Answer:   No    Order Specific Question:   Preferred imaging location?    Answer:   [facility]     All questions were answered. The patient knows to call the clinic with any problems, questions or concerns. No barriers to learning were detected.     Alla Feeling, NP 04/14/21

## 2021-04-15 ENCOUNTER — Ambulatory Visit: Payer: BC Managed Care – PPO | Admitting: Nurse Practitioner

## 2021-04-22 ENCOUNTER — Ambulatory Visit (INDEPENDENT_AMBULATORY_CARE_PROVIDER_SITE_OTHER): Payer: BC Managed Care – PPO | Admitting: Family Medicine

## 2021-04-28 ENCOUNTER — Other Ambulatory Visit (INDEPENDENT_AMBULATORY_CARE_PROVIDER_SITE_OTHER): Payer: Self-pay | Admitting: Family Medicine

## 2021-04-28 ENCOUNTER — Other Ambulatory Visit: Payer: Self-pay

## 2021-04-28 ENCOUNTER — Encounter (INDEPENDENT_AMBULATORY_CARE_PROVIDER_SITE_OTHER): Payer: Self-pay | Admitting: Family Medicine

## 2021-04-28 ENCOUNTER — Ambulatory Visit (INDEPENDENT_AMBULATORY_CARE_PROVIDER_SITE_OTHER): Payer: BC Managed Care – PPO | Admitting: Family Medicine

## 2021-04-28 ENCOUNTER — Other Ambulatory Visit: Payer: Self-pay | Admitting: Nurse Practitioner

## 2021-04-28 VITALS — BP 134/83 | HR 62 | Temp 97.9°F | Ht 63.0 in | Wt 169.0 lb

## 2021-04-28 DIAGNOSIS — E669 Obesity, unspecified: Secondary | ICD-10-CM | POA: Diagnosis not present

## 2021-04-28 DIAGNOSIS — Z9189 Other specified personal risk factors, not elsewhere classified: Secondary | ICD-10-CM | POA: Diagnosis not present

## 2021-04-28 DIAGNOSIS — E559 Vitamin D deficiency, unspecified: Secondary | ICD-10-CM | POA: Diagnosis not present

## 2021-04-28 DIAGNOSIS — F3289 Other specified depressive episodes: Secondary | ICD-10-CM | POA: Diagnosis not present

## 2021-04-28 DIAGNOSIS — Z1231 Encounter for screening mammogram for malignant neoplasm of breast: Secondary | ICD-10-CM

## 2021-04-28 DIAGNOSIS — Z6833 Body mass index (BMI) 33.0-33.9, adult: Secondary | ICD-10-CM | POA: Diagnosis not present

## 2021-04-28 MED ORDER — ESCITALOPRAM OXALATE 10 MG PO TABS: 10.0000 mg | ORAL_TABLET | Freq: Every day | ORAL | 0 refills | Status: DC

## 2021-04-28 NOTE — Progress Notes (Signed)
Chief Complaint:   OBESITY Heather Hayes is here to discuss her progress with her obesity treatment plan along with follow-up of her obesity related diagnoses. Heather Hayes is on the Category 2 Plan and states she is following her eating plan approximately 80% of the time. Heather Hayes states she is doing more walking 7 times per week.  Today's visit was #: 35 Starting weight: 191 lbs Starting date: 05/15/2019 Today's weight: 169 lbs Today's date: 04/28/2021 Total lbs lost to date: 22 Total lbs lost since last in-office visit: 1  Interim History: Heather Hayes continues to do well with weight loss. Her daughter is now working on weight loss with her and she feels this will help her with meal planning and prepping, and decrease temptations.  Subjective:   1. Vitamin D deficiency Heather Hayes is stable on Vit D, and her last level was at goal.  2. Other depression, with emotional eating  Heather Hayes is on Lexapro now. She is taking it intermittently, and so she is not getting the full benefit that she could be getting.  3. At risk for heart disease Heather Hayes is at a higher than average risk for cardiovascular disease due to obesity.   Assessment/Plan:   1. Vitamin D deficiency Low Vitamin D level contributes to fatigue and are associated with obesity, breast, and colon cancer. We will recheck labs in 1 month. [name] will continue Vit D as is for now and will follow-up for routine testing of Vitamin D, at least 2-3 times per year to avoid over-replacement.  2. Other depression, with emotional eating  Behavior modification techniques were discussed today to help Heather Hayes deal with her emotional/non-hunger eating behaviors. We will refill Lexapro for 1 month. Heather Hayes was encouraged to take it daily and we will follow up in 4 weeks. Orders and follow up as documented in patient record.   - escitalopram (LEXAPRO) 10 MG tablet; Take 1 tablet (10 mg total) by mouth daily.  Dispense: 30 tablet; Refill: 0  3. At risk for heart disease Heather Hayes was  given approximately 15 minutes of coronary artery disease prevention counseling today. She is 60 y.o. female and has risk factors for heart disease including obesity. We discussed intensive lifestyle modifications today with an emphasis on specific weight loss instructions and strategies.   Repetitive spaced learning was employed today to elicit superior memory formation and behavioral change.  4. Obesity with current BMI 30.1 Heather Hayes is currently in the action stage of change. As such, her goal is to continue with weight loss efforts. She has agreed to the Category 2 Plan.   We will recheck fasting labs at her next visit.  Exercise goals: As is.  Behavioral modification strategies: dealing with family or coworker sabotage.  Heather Hayes has agreed to follow-up with our clinic in 4 weeks. She was informed of the importance of frequent follow-up visits to maximize her success with intensive lifestyle modifications for her multiple health conditions.   Objective:   Blood pressure 134/83, pulse 62, temperature 97.9 F (36.6 C), height 5\' 3"  (1.6 m), weight 169 lb (76.7 kg), SpO2 100 %. Body mass index is 29.94 kg/m.  General: Cooperative, alert, well developed, in no acute distress. HEENT: Conjunctivae and lids unremarkable. Cardiovascular: Regular rhythm.  Lungs: Normal work of breathing. Neurologic: No focal deficits.   Lab Results  Component Value Date   CREATININE 0.72 09/29/2020   BUN 20 09/29/2020   NA 136 09/29/2020   K 4.6 09/29/2020   CL 99 09/29/2020   CO2  23 09/29/2020   Lab Results  Component Value Date   ALT 23 09/29/2020   AST 19 09/29/2020   ALKPHOS 99 09/29/2020   BILITOT 0.4 09/29/2020   Lab Results  Component Value Date   HGBA1C 5.6 09/29/2020   HGBA1C 5.5 01/22/2020   HGBA1C 5.5 09/05/2019   HGBA1C 5.4 05/15/2019   Lab Results  Component Value Date   INSULIN 6.5 09/29/2020   INSULIN 8.0 05/20/2020   INSULIN 7.5 01/22/2020   INSULIN 8.3 09/05/2019    INSULIN 14.9 05/15/2019   Lab Results  Component Value Date   TSH 1.560 05/15/2019   Lab Results  Component Value Date   CHOL 197 09/29/2020   HDL 64 09/29/2020   LDLCALC 116 (H) 09/29/2020   TRIG 94 09/29/2020   CHOLHDL 2.8 05/20/2020   Lab Results  Component Value Date   VD25OH 77.6 09/29/2020   VD25OH 51.7 01/22/2020   VD25OH 43.7 09/05/2019   Lab Results  Component Value Date   WBC 5.1 09/29/2020   HGB 13.6 01/01/2021   HCT 40.8 09/29/2020   MCV 92 09/29/2020   PLT 275 09/29/2020   No results found for: IRON, TIBC, FERRITIN  Attestation Statements:   Reviewed by clinician on day of visit: allergies, medications, problem list, medical history, surgical history, family history, social history, and previous encounter notes.   I, Trixie Dredge, am acting as transcriptionist for Dennard Nip, MD.  I have reviewed the above documentation for accuracy and completeness, and I agree with the above. -  Dennard Nip, MD

## 2021-05-26 ENCOUNTER — Other Ambulatory Visit: Payer: Self-pay

## 2021-05-26 ENCOUNTER — Ambulatory Visit (INDEPENDENT_AMBULATORY_CARE_PROVIDER_SITE_OTHER): Payer: BC Managed Care – PPO | Admitting: Family Medicine

## 2021-05-26 ENCOUNTER — Encounter (INDEPENDENT_AMBULATORY_CARE_PROVIDER_SITE_OTHER): Payer: Self-pay | Admitting: Family Medicine

## 2021-05-26 VITALS — BP 120/76 | HR 55 | Temp 98.0°F | Ht 63.0 in | Wt 172.0 lb

## 2021-05-26 DIAGNOSIS — E7800 Pure hypercholesterolemia, unspecified: Secondary | ICD-10-CM | POA: Diagnosis not present

## 2021-05-26 DIAGNOSIS — Z9189 Other specified personal risk factors, not elsewhere classified: Secondary | ICD-10-CM | POA: Diagnosis not present

## 2021-05-26 DIAGNOSIS — E559 Vitamin D deficiency, unspecified: Secondary | ICD-10-CM

## 2021-05-26 DIAGNOSIS — E7849 Other hyperlipidemia: Secondary | ICD-10-CM | POA: Diagnosis not present

## 2021-05-26 DIAGNOSIS — E669 Obesity, unspecified: Secondary | ICD-10-CM

## 2021-05-26 DIAGNOSIS — E8881 Metabolic syndrome: Secondary | ICD-10-CM

## 2021-05-26 DIAGNOSIS — Z6833 Body mass index (BMI) 33.0-33.9, adult: Secondary | ICD-10-CM

## 2021-05-26 NOTE — Progress Notes (Signed)
Chief Complaint:   OBESITY Heather Hayes is here to discuss her progress with her obesity treatment plan along with follow-up of her obesity related diagnoses. Heather Hayes is on the Category 2 Plan and states she is following her eating plan approximately 80% of the time. Heather Hayes states she is doing some walking.   Today's visit was #: 30 Starting weight: 191 lbs Starting date: 05/15/2019 Today's weight: 172 lbs Today's date: 05/26/2021 Total lbs lost to date: 19 Total lbs lost since last in-office visit: 0  Interim History: [place] did some celebration eating since her last visit. She is working on getting back on track.  Subjective:   1. Vitamin D deficiency Heather Hayes is on OTC Vit D 5,000 IU daily, and she is due for labs.  2. Insulin resistance Heather Hayes continues to work on diet and exercise. She is not on metformin, and she is due for labs.  3. Other hyperlipidemia Heather Hayes is working on diet and exercise, and she is not on statin. She is fasting for labs today.  4. At risk for heart disease Heather Hayes is at a higher than average risk for cardiovascular disease due to obesity.   Assessment/Plan:   1. Vitamin D deficiency Low Vitamin D level contributes to fatigue and are associated with obesity, breast, and colon cancer. We will check labs today. Heather Hayes will follow-up for routine testing of Vitamin D, at least 2-3 times per year to avoid over-replacement.  - VITAMIN D 25 Hydroxy (Vit-D Deficiency, Fractures)  2. Insulin resistance We will check labs today. Heather Hayes will continue to work on diet, exercise, and decreasing simple carbohydrates to help decrease the risk of diabetes. Heather Hayes agreed to follow-up with Heather Hayes as directed to closely monitor her progress.  - CMP14+EGFR - Insulin, random - Hemoglobin A1c  3. Other hyperlipidemia Cardiovascular risk and specific lipid/LDL goals reviewed. We discussed several lifestyle modifications today. We will check labs today. Heather Hayes will continue to work on diet, exercise  and weight loss efforts. Orders and follow up as documented in patient record.   - Lipid Panel With LDL/HDL Ratio  4. At risk for heart disease Heather Hayes was given approximately 15 minutes of coronary artery disease prevention counseling today. She is 60 y.o. female and has risk factors for heart disease including obesity. We discussed intensive lifestyle modifications today with an emphasis on specific weight loss instructions and strategies.   Repetitive spaced learning was employed today to elicit superior memory formation and behavioral change.  5. Obesity with current BMI 30.5 Heather Hayes is currently in the action stage of change. As such, her goal is to continue with weight loss efforts. She has agreed to the Category 2 Plan or keeping a food journal and adhering to recommended goals of 1200 calories and 75+ grams of protein daily.   Protein rich recipes were given and discussed today.  Exercise goals: As is.  Behavioral modification strategies: increasing lean protein intake.  Heather Hayes has agreed to follow-up with our clinic in 3 to 4 weeks. She was informed of the importance of frequent follow-up visits to maximize her success with intensive lifestyle modifications for her multiple health conditions.   Heather Hayes was informed we would discuss her lab results at her next visit unless there is a critical issue that needs to be addressed sooner. Heather Hayes agreed to keep her next visit at the agreed upon time to discuss these results.  Objective:   Blood pressure 120/76, pulse (!) 55, temperature 98 F (36.7 C), height '5\' 3"'  (  1.6 m), weight 172 lb (78 kg), SpO2 98 %. Body mass index is 30.47 kg/m.  General: Cooperative, alert, well developed, in no acute distress. HEENT: Conjunctivae and lids unremarkable. Cardiovascular: Regular rhythm.  Lungs: Normal work of breathing. Neurologic: No focal deficits.   Lab Results  Component Value Date   CREATININE 0.72 09/29/2020   BUN 20 09/29/2020   NA 136  09/29/2020   K 4.6 09/29/2020   CL 99 09/29/2020   CO2 23 09/29/2020   Lab Results  Component Value Date   ALT 23 09/29/2020   AST 19 09/29/2020   ALKPHOS 99 09/29/2020   BILITOT 0.4 09/29/2020   Lab Results  Component Value Date   HGBA1C 5.6 09/29/2020   HGBA1C 5.5 01/22/2020   HGBA1C 5.5 09/05/2019   HGBA1C 5.4 05/15/2019   Lab Results  Component Value Date   INSULIN 6.5 09/29/2020   INSULIN 8.0 05/20/2020   INSULIN 7.5 01/22/2020   INSULIN 8.3 09/05/2019   INSULIN 14.9 05/15/2019   Lab Results  Component Value Date   TSH 1.560 05/15/2019   Lab Results  Component Value Date   CHOL 197 09/29/2020   HDL 64 09/29/2020   LDLCALC 116 (H) 09/29/2020   TRIG 94 09/29/2020   CHOLHDL 2.8 05/20/2020   Lab Results  Component Value Date   VD25OH 77.6 09/29/2020   VD25OH 51.7 01/22/2020   VD25OH 43.7 09/05/2019   Lab Results  Component Value Date   WBC 5.1 09/29/2020   HGB 13.6 01/01/2021   HCT 40.8 09/29/2020   MCV 92 09/29/2020   PLT 275 09/29/2020   No results found for: IRON, TIBC, FERRITIN  Attestation Statements:   Reviewed by clinician on day of visit: allergies, medications, problem list, medical history, surgical history, family history, social history, and previous encounter notes.   I, Trixie Dredge, am acting as transcriptionist for Dennard Nip, MD.  I have reviewed the above documentation for accuracy and completeness, and I agree with the above. -  Dennard Nip, MD

## 2021-05-27 LAB — LIPID PANEL WITH LDL/HDL RATIO
Cholesterol, Total: 193 mg/dL (ref 100–199)
HDL: 68 mg/dL (ref >39)
LDL Chol Calc (NIH): 100 mg/dL — ABNORMAL HIGH (ref 0–99)
LDL/HDL Ratio: 1.5 ratio (ref 0.0–3.2)
Triglycerides: 147 mg/dL (ref 0–149)
VLDL Cholesterol Cal: 25 mg/dL (ref 5–40)

## 2021-05-27 LAB — CMP14+EGFR
ALT: 44 IU/L — ABNORMAL HIGH (ref 0–32)
AST: 45 IU/L — ABNORMAL HIGH (ref 0–40)
Albumin/Globulin Ratio: 1.6 (ref 1.2–2.2)
Albumin: 4.4 g/dL (ref 3.8–4.9)
Alkaline Phosphatase: 117 IU/L (ref 44–121)
BUN/Creatinine Ratio: 20 (ref 12–28)
BUN: 15 mg/dL (ref 8–27)
Bilirubin Total: 0.5 mg/dL (ref 0.0–1.2)
CO2: 25 mmol/L (ref 20–29)
Calcium: 10.2 mg/dL (ref 8.7–10.3)
Chloride: 104 mmol/L (ref 96–106)
Creatinine, Ser: 0.75 mg/dL (ref 0.57–1.00)
Globulin, Total: 2.8 g/dL (ref 1.5–4.5)
Glucose: 88 mg/dL (ref 70–99)
Potassium: 4.9 mmol/L (ref 3.5–5.2)
Sodium: 142 mmol/L (ref 134–144)
Total Protein: 7.2 g/dL (ref 6.0–8.5)
eGFR: 91 mL/min/{1.73_m2} (ref >59)

## 2021-05-27 LAB — VITAMIN D 25 HYDROXY (VIT D DEFICIENCY, FRACTURES): Vit D, 25-Hydroxy: 86.4 ng/mL (ref 30.0–100.0)

## 2021-05-27 LAB — HEMOGLOBIN A1C
Est. average glucose Bld gHb Est-mCnc: 111 mg/dL
Hgb A1c MFr Bld: 5.5 % (ref 4.8–5.6)

## 2021-05-27 LAB — INSULIN, RANDOM: INSULIN: 6.7 u[IU]/mL (ref 2.6–24.9)

## 2021-06-16 ENCOUNTER — Ambulatory Visit (INDEPENDENT_AMBULATORY_CARE_PROVIDER_SITE_OTHER): Payer: BC Managed Care – PPO | Admitting: Family Medicine

## 2021-06-30 ENCOUNTER — Ambulatory Visit (INDEPENDENT_AMBULATORY_CARE_PROVIDER_SITE_OTHER): Payer: BC Managed Care – PPO | Admitting: Family Medicine

## 2021-06-30 ENCOUNTER — Other Ambulatory Visit: Payer: Self-pay

## 2021-06-30 ENCOUNTER — Encounter (INDEPENDENT_AMBULATORY_CARE_PROVIDER_SITE_OTHER): Payer: Self-pay | Admitting: Family Medicine

## 2021-06-30 VITALS — BP 121/77 | HR 59 | Temp 97.6°F | Ht 63.0 in | Wt 172.0 lb

## 2021-06-30 DIAGNOSIS — R7989 Other specified abnormal findings of blood chemistry: Secondary | ICD-10-CM

## 2021-06-30 DIAGNOSIS — Z6833 Body mass index (BMI) 33.0-33.9, adult: Secondary | ICD-10-CM

## 2021-06-30 DIAGNOSIS — E669 Obesity, unspecified: Secondary | ICD-10-CM

## 2021-06-30 DIAGNOSIS — E559 Vitamin D deficiency, unspecified: Secondary | ICD-10-CM | POA: Diagnosis not present

## 2021-06-30 DIAGNOSIS — F3289 Other specified depressive episodes: Secondary | ICD-10-CM

## 2021-06-30 MED ORDER — ESCITALOPRAM OXALATE 10 MG PO TABS: 10.0000 mg | ORAL_TABLET | Freq: Every day | ORAL | 0 refills | Status: DC

## 2021-06-30 MED ORDER — VITAMIN D 125 MCG (5000 UT) PO CAPS: ORAL_CAPSULE | ORAL | 0 refills | Status: DC

## 2021-07-01 NOTE — Progress Notes (Signed)
Chief Complaint:   OBESITY Heather Hayes is here to discuss her progress with her obesity treatment plan along with follow-up of her obesity related diagnoses. Heather Hayes is on the Category 2 Plan and keeping a food journal and adhering to recommended goals of 1200 calories and 75 grams of protein daily and states she is following her eating plan approximately 75% of the time. Heather Hayes states she is walking and bike riding for 20 minutes 2 times per week.  Today's visit was #: 78 Starting weight: 191 lbs Starting date: 05/15/2019 Today's weight: 172 lbs Today's date: 06/30/2021 Total lbs lost to date: 19 lbs Total lbs lost since last in-office visit: 0  Interim History: Heather Hayes daughter's a patient and was also present at today's office visit. Heather Hayes notes having more sweets over the holidays. She likes Category 2 vs journaling. She does cook dinner in the evenings. Sometimes it includes carbohydrates.   Subjective:   1. Vitamin D deficiency Heather Hayes's Vitamin D is at goal 86.4 and has increased from 77.6 on OTC Vitamin D 5000 IU daily.  Lab Results  Component Value Date   VD25OH 86.4 05/26/2021   VD25OH 77.6 09/29/2020   VD25OH 51.7 01/22/2020    2. LFT elevation Heather Hayes's LFTs were elevated at last lab check. 2016 abdominal US notes normal liver.  Lab Results  Component Value Date   ALT 44 (H) 05/26/2021   AST 45 (H) 05/26/2021   ALKPHOS 117 05/26/2021   BILITOT 0.5 05/26/2021     3. Other depression, with emotional eating  Heather Hayes's mood is stable. She notes some cravings.  Assessment/Plan:   1. Vitamin D deficiency  Heather Hayes will decrease dose of prescription Vitamin D 5,000 IU daily Monday through Friday and she will follow-up for routine testing of Vitamin D, at least 2-3 times per year to avoid over-replacement.  - Cholecalciferol (VITAMIN D) 125 MCG (5000 UT) CAPS; PO 5 days per week.  Dispense: 30 capsule; Refill: 0  2. LFT elevation We discussed the likely diagnosis of non-alcoholic  fatty liver disease today and how this condition is obesity related. We will continue to monitor. Heather Hayes was educated the importance of weight loss.   3. Other depression, with emotional eating    We will refill Lexapro 10 mg daily. Orders and follow up as documented in patient record.    - escitalopram (LEXAPRO) 10 MG tablet; Take 1 tablet (10 mg total) by mouth daily.  Dispense: 30 tablet; Refill: 0  4. Obesity with current BMI 30.48 Heather Hayes is currently in the action stage of change. As such, her goal is to continue with weight loss efforts. She has agreed to the Category 2 Plan and keeping a food journal and adhering to recommended goals of 400-500 calories and 35 grams of protein at supper.  Heather Hayes may journal at supper.  Exercise goals:  As is.  Behavioral modification strategies: decreasing simple carbohydrates and meal planning and cooking strategies.  Heather Hayes has agreed to follow-up with our clinic in 5 weeks. She was informed of the importance of frequent follow-up visits to maximize her success with intensive lifestyle modifications for her multiple health conditions.   Objective:   Blood pressure 121/77, pulse (!) 59, temperature 97.6 F (36.4 C), height 5\' 3"  (1.6 m), weight 172 lb (78 kg), SpO2 98 %. Body mass index is 30.47 kg/m.  General: Cooperative, alert, well developed, in no acute distress. HEENT: Conjunctivae and lids unremarkable. Cardiovascular: Regular rhythm.  Lungs: Normal work of breathing. Neurologic:  No focal deficits.   Lab Results  Component Value Date   CREATININE 0.75 05/26/2021   BUN 15 05/26/2021   NA 142 05/26/2021   K 4.9 05/26/2021   CL 104 05/26/2021   CO2 25 05/26/2021   Lab Results  Component Value Date   ALT 44 (H) 05/26/2021   AST 45 (H) 05/26/2021   ALKPHOS 117 05/26/2021   BILITOT 0.5 05/26/2021   Lab Results  Component Value Date   HGBA1C 5.5 05/26/2021   HGBA1C 5.6 09/29/2020   HGBA1C 5.5 01/22/2020   HGBA1C 5.5 09/05/2019    HGBA1C 5.4 05/15/2019   Lab Results  Component Value Date   INSULIN 6.7 05/26/2021   INSULIN 6.5 09/29/2020   INSULIN 8.0 05/20/2020   INSULIN 7.5 01/22/2020   INSULIN 8.3 09/05/2019   Lab Results  Component Value Date   TSH 1.560 05/15/2019   Lab Results  Component Value Date   CHOL 193 05/26/2021   HDL 68 05/26/2021   LDLCALC 100 (H) 05/26/2021   TRIG 147 05/26/2021   CHOLHDL 2.8 05/20/2020   Lab Results  Component Value Date   VD25OH 86.4 05/26/2021   VD25OH 77.6 09/29/2020   VD25OH 51.7 01/22/2020   Lab Results  Component Value Date   WBC 5.1 09/29/2020   HGB 13.6 01/01/2021   HCT 40.8 09/29/2020   MCV 92 09/29/2020   PLT 275 09/29/2020   No results found for: IRON, TIBC, FERRITIN  Attestation Statements:   Reviewed by clinician on day of visit: allergies, medications, problem list, medical history, surgical history, family history, social history, and previous encounter notes.  I, Lizbeth Bark, RMA, am acting as Location manager for [employer], [place].   I have reviewed the above documentation for accuracy and completeness, and I agree with the above. -  Georgianne Fick, FNP

## 2021-07-13 NOTE — Progress Notes (Signed)
[facility]   Telephone:([phone] [phone]) [phone]   Clinic Follow up Note   Patient Care Team: Molli Posey, MD as PCP - General (Obstetrics and Gynecology) Mauro Kaufmann, RN as Oncology Nurse Navigator Rockwell Germany, RN as Oncology Nurse Navigator Excell Seltzer, MD (Inactive) as Consulting Physician (General Surgery) Truitt Merle, MD as Consulting Physician (Hematology) Gery Pray, MD as Consulting Physician (Radiation Oncology) Alla Feeling, NP as Nurse Practitioner (Nurse Practitioner) 07/14/2021  I connected with Sinda Du on 07/14/21 at 10:00 AM EST by telephone visit and verified that I am speaking with the correct person using two identifiers.   I discussed the limitations, risks, security and privacy concerns of performing an evaluation and management service by telemedicine and the availability of in-person appointments. I also discussed with the patient that there may be a patient responsible charge related to this service. The patient expressed understanding and agreed to proceed.   Other persons participating in the visit and their role in the encounter: none    Patient's location: Work Provider's location: [employer] office  CHIEF COMPLAINT: Med check exemestane, history of right breast cancer  SUMMARY OF ONCOLOGIC HISTORY: Oncology History Overview Note  Cancer Staging Malignant neoplasm of upper-outer quadrant of right breast in female, estrogen receptor positive ([place]) Staging form: Breast, AJCC 8th Edition - Clinical stage from 10/02/2018: Stage IA (cT1b, cN0, cM0, G2, ER+, PR+, HER2-) - Signed by Truitt Merle, MD on 10/10/2018 - Pathologic stage from 10/27/2018: Stage IA (pT1b, pN0, cM0, G1, ER+, PR+, HER2-) - Signed by Truitt Merle, MD on 02/26/2019     Malignant neoplasm of upper-outer quadrant of right breast in female, estrogen receptor positive ([place])  09/27/2018 Mammogram   [facility] Mammogram 09/27/18  IMPRESSION: 1. Suspicious  right breast mass measures 7 x 7 x 6 mm with associated vascularity at the 12 o'clock position 7 cm from the nipple. 2. No suspicious right axillary lymphadenopathy.   10/02/2018 Cancer Staging   Staging form: Breast, AJCC 8th Edition - Clinical stage from 10/02/2018: Stage IA (cT1b, cN0, cM0, G2, ER+, PR+, HER2-) - Signed by Truitt Merle, MD on 10/10/2018    10/02/2018 Initial Biopsy   Diagnosis 10/02/18 Breast, right, needle core biopsy, 12 o'clock - INVASIVE DUCTAL CARCINOMA.   10/02/2018 Receptors her2   Results: IMMUNOHISTOCHEMICAL AND MORPHOMETRIC ANALYSIS PERFORMED MANUALLY The tumor cells are NEGATIVE for Her2 (1+). Estrogen Receptor: 95%, POSITIVE, STRONG STAINING INTENSITY Progesterone Receptor: 95%, POSITIVE, STRONG STAINING INTENSITY Proliferation Marker Ki67: 5%   10/04/2018 Initial Diagnosis   Malignant neoplasm of upper-outer quadrant of right breast in female, estrogen receptor positive ([place])   10/27/2018 Surgery   RIGHT BREAST LUMPECTOMY WITH RADIOACTIVE SEED AND SENTINEL LYMPH NODE BIOPSY by Dr Excell Seltzer   10/27/2018 Pathology Results   Diagnosis 10/27/18 1. Breast, lumpectomy, Right - INVASIVE DUCTAL CARCINOMA WITH CALCIFICATIONS, GRADE I/III, SPANNING 0.7 CM. - THE SURGICAL RESECTION MARGIN ARE NEGATIVE FOR CARCINOMA. - SEE ONCOLOGY TABLE BELOW. 2. Lymph node, sentinel, biopsy, Right - THERE IS NO EVIDENCE OF CARCINOMA IN 1 OF 1 LYMPH NODE (0/1). 3. Lymph node, sentinel, biopsy, Right - THERE IS NO EVIDENCE OF CARCINOMA IN 1 OF 1 LYMPH NODE (0/1).    - 01/29/2019 Radiation Therapy   [facility] on 01/29/19.    10/27/2018 Cancer Staging   Staging form: Breast, AJCC 8th Edition - Pathologic stage from 10/27/2018: Stage IA (pT1b, pN0, cM0, G1, ER+, PR+, HER2-) - Signed by Truitt Merle, MD on  02/26/2019    03/2019 -  Anti-estrogen oral therapy   Anastrozole 10m daily starting in 03/2019    07/09/2019 Survivorship   SCP delivered by LCira Rue NP       CURRENT THERAPY: Anastrozole, starting 03/2019, SE's joint pain and low libido and switched to exemestane 05/02/21   INTERVAL HISTORY: Ms. BChaudharypresents virtually as scheduled. Last seen in person by me 04/14/21 at which time anastrozole was discontinued due to worsening joint pain and low libido. She started exemestane 05/02/21.  She is doing "just fine."  She is tolerating exemestane, hardest part is remembering to take it after breakfast.  Joint pain improved with some joint pain now in the left hip.  She attributes to known bursitis and arthritis.  She is managing well, does not limit activities.  Her libido is slightly improved on exemestane compared to anastrozole.  No change in hot flashes, mood is good.  Manages occasional constipation.  Denies concerns in her breast such as new lump/mass, nipple discharge or inversion, or skin change.  She is up-to-date on cancer screenings and is followed by healthy weight management.  She takes calcium daily and vitamin D Monday through Friday.  Denies any pain or other new complaints.   MEDICAL HISTORY:  Past Medical History:  Diagnosis Date   Anxiety    Arthritis    Back pain    Bursitis    [place]    right breast cancer 09-2018   Constipation    GERD (gastroesophageal reflux disease)    some heart burn from Arimidex   History of radiation therapy    completed 01-29-2019   Hyperlipidemia    Joint pain    Personal history of radiation therapy    Rheumatic fever    Swallowing difficulty     SURGICAL HISTORY: Past Surgical History:  Procedure Laterality Date   BREAST LUMPECTOMY Right 10/27/2018   BREAST LUMPECTOMY WITH RADIOACTIVE SEED AND SENTINEL LYMPH NODE BIOPSY Right 10/27/2018   Procedure: RIGHT BREAST LUMPECTOMY WITH RADIOACTIVE SEED AND SENTINEL LYMPH NODE BIOPSY;  Surgeon: HExcell Seltzer MD;  [facility] [facility]  Service: General;  Laterality: Right;   CERVICAL SPINE SURGERY  2009   COLONOSCOPY      ENDOMETRIAL ABLATION     POLYPECTOMY     TUBAL LIGATION     tube ligation       I have reviewed the social history and family history with the patient and they are unchanged from previous note.  ALLERGIES:  is allergic to compazine [prochlorperazine edisylate].  MEDICATIONS:  Current Outpatient Medications  Medication Sig Dispense Refill   Cholecalciferol (VITAMIN D) 125 MCG (5000 UT) CAPS PO 5 days per week. 30 capsule 0   clonazePAM (KLONOPIN) 0.5 MG tablet Take 0.5 mg by mouth 2 (two) times daily as needed for anxiety.     escitalopram (LEXAPRO) 10 MG tablet Take 1 tablet (10 mg total) by mouth daily. 30 tablet 0   exemestane (AROMASIN) 25 MG tablet Take 1 tablet (25 mg total) by mouth daily after breakfast. 30 tablet 6   meloxicam (MOBIC) 15 MG tablet Take 15 mg by mouth daily.     No current facility-administered medications for this visit.    PHYSICAL EXAMINATION: ECOG PERFORMANCE STATUS: 1 - Symptomatic but completely ambulatory  There were no vitals filed for this visit. There were no vitals filed for this visit.  Patient appears well over the phone.  Speech is clear, voice is strong.  Mood/affect  appear normal.  No cough or conversational dyspnea.  LABORATORY DATA:  I have reviewed the data as listed CBC Latest Ref Rng & Units 01/01/2021 09/29/2020 04/25/2020  WBC 3.4 - 10.8 x10E3/uL - 5.1 9.2  Hemoglobin 12.0 - 16.0 13.6 13.7 13.5  Hematocrit 34.0 - 46.6 % - 40.8 41.5  Platelets 150 - 450 x10E3/uL - 275 163     CMP Latest Ref Rng & Units 05/26/2021 09/29/2020 05/20/2020  Glucose 70 - 99 mg/dL 88 87 88  BUN 8 - 27 mg/dL _0 Creatinine 0.57 - 1.00 mg/dL 0.75 0.72 0.72  Sodium 134 - 144 mmol/L 142 136 140  Potassium 3.5 - 5.2 mmol/L 4.9 4.6 4.4  Chloride 96 - 106 mmol/L 104 99 100  CO2 20 - 29 mmol/L _1 Calcium 8.7 - 10.3 mg/dL 10.2 10.0 10.0  Total Protein 6.0 - 8.5 g/dL 7.2 7.2 7.5  Total Bilirubin 0.0 - 1.2 mg/dL 0.5 0.4 0.5  Alkaline Phos 44 - 121  IU/L 117 99 106  AST 0 - 40 IU/L 45(H) 19 14  ALT 0 - 32 IU/L 44(H) 23 13      RADIOGRAPHIC STUDIES: I have personally reviewed the radiological images as listed and agreed with the findings in the report. No results found.   ASSESSMENT & PLAN: ZOFIA PECKINPAUGH is a 60 y.o. female with      1. Malignant neoplasm of upper-outer quadrant of right breast, Stage IA, p (T1b,N0,M0), ER/PR+, HER2-, Grade II -Diagnosed in 10/2018, s/p right lumpectomy and radiation.  Given low-grade strongly ER positive disease, Oncotype was not recommended  -She began antiestrogen therapy with anastrozole on 03/2019, tolerated well with stable arthritic pain in the hips and back, hot flashes, and brittle nails.   -Breast density is category D, mammogram 10/09/2020 negative -Joint pain mainly in her lower back and hips/low body has worsened, she also has low libido.  She stopped anastrozole in 04/2021 and started exemestane in 05/2021, side effects have improved and she is tolerating it well.  Continue exemestane -Next mammogram 3/33/2022  -There is no clinical concern for recurrence, continue surveillance and AI -Lab and in person visit in 3 months, same day as mammogram.   2. Bone Health  -Her 10/25/17 and 10/08/2020 DEXA scans were normal  -She is aware of AI's can reduce her bone density. -recent labs 10/22 show calcium 10.2, reaching high end of normal. She takes calcium every day, will reduce to M-F -Continue calcium/vitamin D daily Monday through Friday, and weightbearing exercise  -repeat in 10/2022    3.  Genetic testing 2019, negative   4.  Arthritis and bursitis of her hips -Previously stable on AI, controlled with meloxicam -Worsened on anastrozole, limiting some activities  -Improved on exemestane -Continue monitoring  PLAN: -Continue exemestane and surveillance -Take calcium and vitamin D daily M-F -Continue healthy lifestyle -F/up 10/12/21 after mammogram, or sooner if needed  I discussed the  assessment and treatment plan with the patient. The patient was provided an opportunity to ask questions and all were answered. The patient agreed with the plan and demonstrated an understanding of the instructions.   The patient was advised to call back or seek an in-person evaluation if the symptoms worsen or if the condition fails to improve as anticipated. The total time spent in the appointment was 5 minutes.     Alla Feeling, NP 07/14/21

## 2021-07-14 ENCOUNTER — Encounter: Payer: Self-pay | Admitting: Nurse Practitioner

## 2021-07-14 ENCOUNTER — Inpatient Hospital Stay: Payer: BC Managed Care – PPO | Attending: Nurse Practitioner | Admitting: Nurse Practitioner

## 2021-07-14 DIAGNOSIS — C50411 Malignant neoplasm of upper-outer quadrant of right female breast: Secondary | ICD-10-CM

## 2021-07-14 DIAGNOSIS — Z170 Estrogen receptor positive status [ER+]: Secondary | ICD-10-CM | POA: Diagnosis not present

## 2021-08-05 ENCOUNTER — Ambulatory Visit (INDEPENDENT_AMBULATORY_CARE_PROVIDER_SITE_OTHER): Payer: BC Managed Care – PPO | Admitting: Family Medicine

## 2021-08-06 ENCOUNTER — Encounter (INDEPENDENT_AMBULATORY_CARE_PROVIDER_SITE_OTHER): Payer: Self-pay | Admitting: Bariatrics

## 2021-08-06 ENCOUNTER — Ambulatory Visit (INDEPENDENT_AMBULATORY_CARE_PROVIDER_SITE_OTHER): Payer: BC Managed Care – PPO | Admitting: Bariatrics

## 2021-08-06 ENCOUNTER — Other Ambulatory Visit: Payer: Self-pay

## 2021-08-06 VITALS — BP 117/80 | HR 61 | Temp 97.7°F | Ht 63.0 in | Wt 175.0 lb

## 2021-08-06 DIAGNOSIS — E8881 Metabolic syndrome: Secondary | ICD-10-CM | POA: Diagnosis not present

## 2021-08-06 DIAGNOSIS — E7849 Other hyperlipidemia: Secondary | ICD-10-CM

## 2021-08-06 DIAGNOSIS — E669 Obesity, unspecified: Secondary | ICD-10-CM

## 2021-08-06 DIAGNOSIS — F5089 Other specified eating disorder: Secondary | ICD-10-CM | POA: Diagnosis not present

## 2021-08-06 DIAGNOSIS — Z6833 Body mass index (BMI) 33.0-33.9, adult: Secondary | ICD-10-CM

## 2021-08-06 MED ORDER — BUPROPION HCL ER (SR) 150 MG PO TB12: 150.0000 mg | ORAL_TABLET | Freq: Every day | ORAL | 0 refills | Status: DC

## 2021-08-06 NOTE — Progress Notes (Signed)
Chief Complaint:   OBESITY Heather Hayes is here to discuss her progress with her obesity treatment plan along with follow-up of her obesity related diagnoses. Heather Hayes is on the Category 2 Plan and states she is following her eating plan approximately 75% of the time. Heather Hayes states she is walking for 20-30 minutes 3 times per week.  Today's visit was #: 34 Starting weight: 191 lbs Starting date: 05/15/2019 Today's weight: 175 lbs Today's date: 08/06/2021 Total lbs lost to date: 16 lbs Total lbs lost since last in-office visit: 0  Interim History: Heather Hayes is up 3 lbs since her last visit. She wants to get back to the 160's and size 12. She struggle with sugar cravings.   Subjective:   1. Other hyperlipidemia Heather Hayes is currently not on medications.   2. Insulin resistance Heather Hayes is not on medications currently.   3. Other disorder of eating Heather Hayes notes sweet cravings.   Assessment/Plan:   1. Other hyperlipidemia Cardiovascular risk and specific lipid/LDL goals reviewed.  We discussed several lifestyle modifications today and Heather Hayes will continue to work on diet, exercise and weight loss efforts. Heather Hayes will have zero trans fats. She will restrict saturated fats to healthy fats (example dairy). Orders and follow up as documented in patient record.   Counseling Intensive lifestyle modifications are the first line treatment for this issue. Dietary changes: Increase soluble fiber. Decrease simple carbohydrates. Exercise changes: Moderate to vigorous-intensity aerobic activity 150 minutes per week if tolerated. Lipid-lowering medications: see documented in medical record.  2. Insulin resistance Heather Hayes will continue to work on weight loss, exercise, and decreasing simple carbohydrates to help decrease the risk of diabetes. She will increase healthy fats and protein. Heather Hayes agreed to follow-up with [name] as directed to closely monitor her progress.  3. Other disorder of eating We will refill Wellbutrin 150 mg  for 1 month with no refills. Behavior modification techniques were discussed today to help Heather Hayes deal with her emotional/non-hunger eating behaviors.  Orders and follow up as documented in patient record.    - buPROPion (WELLBUTRIN SR) 150 MG 12 hr tablet; Take 1 tablet (150 mg total) by mouth daily.  Dispense: 30 tablet; Refill: 0  4. Obesity with current BMI 31 Heather Hayes is currently in the action stage of change. As such, her goal is to continue with weight loss efforts. She has agreed to the Category 2 Plan.   Heather Hayes will continue meal planning. She will adhere closely to the plan 80-90%. She will keep protein high.   Exercise goals:  As is. Heather Hayes will start using weights.   Behavioral modification strategies: increasing lean protein intake, decreasing simple carbohydrates, increasing vegetables, increasing water intake, decreasing eating out, no skipping meals, meal planning and cooking strategies, keeping healthy foods in the home, and planning for success.  Heather Hayes has agreed to follow-up with our clinic in 3-4 weeks with Dr. Leafy Ro, Jake Bathe, [place] or Abby Potash, PA-C. She was informed of the importance of frequent follow-up visits to maximize her success with intensive lifestyle modifications for her multiple health conditions.   Objective:   Blood pressure 117/80, pulse 61, temperature 97.7 F (36.5 C), height 5\' 3"  (1.6 m), weight 175 lb (79.4 kg), SpO2 97 %. Body mass index is 31 kg/m.  General: Cooperative, alert, well developed, in no acute distress. HEENT: Conjunctivae and lids unremarkable. Cardiovascular: Regular rhythm.  Lungs: Normal work of breathing. Neurologic: No focal deficits.   Lab Results  Component Value Date   CREATININE 0.75  05/26/2021   BUN 15 05/26/2021   NA 142 05/26/2021   K 4.9 05/26/2021   CL 104 05/26/2021   CO2 25 05/26/2021   Lab Results  Component Value Date   ALT 44 (H) 05/26/2021   AST 45 (H) 05/26/2021   ALKPHOS 117 05/26/2021   BILITOT  0.5 05/26/2021   Lab Results  Component Value Date   HGBA1C 5.5 05/26/2021   HGBA1C 5.6 09/29/2020   HGBA1C 5.5 01/22/2020   HGBA1C 5.5 09/05/2019   HGBA1C 5.4 05/15/2019   Lab Results  Component Value Date   INSULIN 6.7 05/26/2021   INSULIN 6.5 09/29/2020   INSULIN 8.0 05/20/2020   INSULIN 7.5 01/22/2020   INSULIN 8.3 09/05/2019   Lab Results  Component Value Date   TSH 1.560 05/15/2019   Lab Results  Component Value Date   CHOL 193 05/26/2021   HDL 68 05/26/2021   LDLCALC 100 (H) 05/26/2021   TRIG 147 05/26/2021   CHOLHDL 2.8 05/20/2020   Lab Results  Component Value Date   VD25OH 86.4 05/26/2021   VD25OH 77.6 09/29/2020   VD25OH 51.7 01/22/2020   Lab Results  Component Value Date   WBC 5.1 09/29/2020   HGB 13.6 01/01/2021   HCT 40.8 09/29/2020   MCV 92 09/29/2020   PLT 275 09/29/2020   No results found for: IRON, TIBC, FERRITIN  Attestation Statements:   Reviewed by clinician on day of visit: allergies, medications, problem list, medical history, surgical history, family history, social history, and previous encounter notes.  I, Lizbeth Bark, RMA, am acting as Location manager for [employer], DO.  I have reviewed the above documentation for accuracy and completeness, and I agree with the above. Jearld Lesch, DO

## 2021-08-10 ENCOUNTER — Encounter (INDEPENDENT_AMBULATORY_CARE_PROVIDER_SITE_OTHER): Payer: Self-pay | Admitting: Bariatrics

## 2021-08-26 ENCOUNTER — Other Ambulatory Visit (INDEPENDENT_AMBULATORY_CARE_PROVIDER_SITE_OTHER): Payer: Self-pay | Admitting: Family Medicine

## 2021-08-26 ENCOUNTER — Other Ambulatory Visit: Payer: Self-pay

## 2021-08-26 ENCOUNTER — Ambulatory Visit (INDEPENDENT_AMBULATORY_CARE_PROVIDER_SITE_OTHER): Payer: BC Managed Care – PPO | Admitting: Family Medicine

## 2021-08-26 ENCOUNTER — Encounter (INDEPENDENT_AMBULATORY_CARE_PROVIDER_SITE_OTHER): Payer: Self-pay | Admitting: Family Medicine

## 2021-08-26 VITALS — BP 107/71 | HR 63 | Temp 97.8°F | Ht 63.0 in | Wt 171.0 lb

## 2021-08-26 DIAGNOSIS — M25552 Pain in left hip: Secondary | ICD-10-CM

## 2021-08-26 DIAGNOSIS — F5089 Other specified eating disorder: Secondary | ICD-10-CM

## 2021-08-26 DIAGNOSIS — Z6833 Body mass index (BMI) 33.0-33.9, adult: Secondary | ICD-10-CM

## 2021-08-26 DIAGNOSIS — Z6830 Body mass index (BMI) 30.0-30.9, adult: Secondary | ICD-10-CM

## 2021-08-26 DIAGNOSIS — E669 Obesity, unspecified: Secondary | ICD-10-CM

## 2021-08-26 MED ORDER — ESCITALOPRAM OXALATE 10 MG PO TABS: 10.0000 mg | ORAL_TABLET | Freq: Every day | ORAL | 0 refills | Status: DC

## 2021-08-26 MED ORDER — BUPROPION HCL ER (SR) 150 MG PO TB12: 150.0000 mg | ORAL_TABLET | Freq: Every day | ORAL | 0 refills | Status: DC

## 2021-08-26 NOTE — Progress Notes (Signed)
Chief Complaint:   OBESITY Heather Hayes is here to discuss her progress with her obesity treatment plan along with follow-up of her obesity related diagnoses. Heather Hayes is on the Category 2 Plan and states she is following her eating plan approximately 80% of the time. Heather Hayes states she is walking for 30 minutes 2 times per week.  Today's visit was #: 26 Starting weight: 191 lbs Starting date: 05/15/2019 Today's weight: 171 lbs Today's date: 08/26/2021 Total lbs lost to date: 20lbs Total lbs lost since last in-office visit: 4 lbs  Interim History: Heather Hayes struggles to get in protein, but is working on it. She chooses protein snacks to supplement her protein intake. . She is going to [facility] in March and is working on increasing her endurance with walking.  Subjective:   1. Hip pain, left Fiza notes left hip pain. She has a history of right hip pain from bursitis. She also has a "bulging discs" in her back. She is a patient of [facility] but has not seen them in a while. Her hip pain is more painful at rest.   2. Other disorder of eating/emotional eating Heather Hayes feels bupropion helps with sweets cravings. She sometimes struggles with sweets due to high availability at work. She denies depression. Her mood is stable.  Assessment/Plan:   1. Hip pain, left Heather Hayes will make an appointment with [facility] to have hip evaluated.   2. Other disorder of eating/emotional eating We will refill Lexapro 10 every day. We will refill bupropion 150 mg every day.  - buPROPion (WELLBUTRIN SR) 150 MG 12 hr tablet; Take 1 tablet (150 mg total) by mouth daily.  Dispense: 30 tablet; Refill: 0 - escitalopram (LEXAPRO) 10 MG tablet; Take 1 tablet (10 mg total) by mouth daily.  Dispense: 30 tablet; Refill: 0  3. Obesity with current BMI 30.3 Heather Hayes is currently in the action stage of change. As such, her goal is to continue with weight loss efforts. She has agreed to the Category 2 Plan.   Exercise  goals:  Heather Hayes will increase walking in duration and frequency.  Behavioral modification strategies: increasing lean protein intake.  Heather Hayes has agreed to follow-up with our clinic in 3-4 weeks.  Objective:   Blood pressure 107/71, pulse 63, temperature 97.8 F (36.6 C), height 5\' 3"  (1.6 m), weight 171 lb (77.6 kg), SpO2 100 %. Body mass index is 30.29 kg/m.  General: Cooperative, alert, well developed, in no acute distress. HEENT: Conjunctivae and lids unremarkable. Cardiovascular: Regular rhythm.  Lungs: Normal work of breathing. Neurologic: No focal deficits.   Lab Results  Component Value Date   CREATININE 0.75 05/26/2021   BUN 15 05/26/2021   NA 142 05/26/2021   K 4.9 05/26/2021   CL 104 05/26/2021   CO2 25 05/26/2021   Lab Results  Component Value Date   ALT 44 (H) 05/26/2021   AST 45 (H) 05/26/2021   ALKPHOS 117 05/26/2021   BILITOT 0.5 05/26/2021   Lab Results  Component Value Date   HGBA1C 5.5 05/26/2021   HGBA1C 5.6 09/29/2020   HGBA1C 5.5 01/22/2020   HGBA1C 5.5 09/05/2019   HGBA1C 5.4 05/15/2019   Lab Results  Component Value Date   INSULIN 6.7 05/26/2021   INSULIN 6.5 09/29/2020   INSULIN 8.0 05/20/2020   INSULIN 7.5 01/22/2020   INSULIN 8.3 09/05/2019   Lab Results  Component Value Date   TSH 1.560 05/15/2019   Lab Results  Component Value Date   CHOL  193 05/26/2021   HDL 68 05/26/2021   LDLCALC 100 (H) 05/26/2021   TRIG 147 05/26/2021   CHOLHDL 2.8 05/20/2020   Lab Results  Component Value Date   VD25OH 86.4 05/26/2021   VD25OH 77.6 09/29/2020   VD25OH 51.7 01/22/2020   Lab Results  Component Value Date   WBC 5.1 09/29/2020   HGB 13.6 01/01/2021   HCT 40.8 09/29/2020   MCV 92 09/29/2020   PLT 275 09/29/2020   No results found for: IRON, TIBC, FERRITIN  Attestation Statements:   Reviewed by clinician on day of visit: allergies, medications, problem list, medical history, surgical history, family history, social history, and  previous encounter notes.  I, Lizbeth Bark, RMA, am acting as Location manager for [employer], [place].  I have reviewed the above documentation for accuracy and completeness, and I agree with the above. -  Georgianne Fick, FNP

## 2021-09-01 DIAGNOSIS — M25552 Pain in left hip: Secondary | ICD-10-CM | POA: Diagnosis not present

## 2021-09-01 DIAGNOSIS — M5442 Lumbago with sciatica, left side: Secondary | ICD-10-CM | POA: Diagnosis not present

## 2021-09-01 DIAGNOSIS — M7062 Trochanteric bursitis, left hip: Secondary | ICD-10-CM | POA: Diagnosis not present

## 2021-09-16 ENCOUNTER — Other Ambulatory Visit: Payer: Self-pay

## 2021-09-16 ENCOUNTER — Encounter (INDEPENDENT_AMBULATORY_CARE_PROVIDER_SITE_OTHER): Payer: Self-pay | Admitting: Family Medicine

## 2021-09-16 ENCOUNTER — Ambulatory Visit (INDEPENDENT_AMBULATORY_CARE_PROVIDER_SITE_OTHER): Payer: BC Managed Care – PPO | Admitting: Family Medicine

## 2021-09-16 VITALS — BP 116/72 | HR 64 | Temp 97.7°F | Ht 63.0 in | Wt 171.0 lb

## 2021-09-16 DIAGNOSIS — Z6830 Body mass index (BMI) 30.0-30.9, adult: Secondary | ICD-10-CM

## 2021-09-16 DIAGNOSIS — M7072 Other bursitis of hip, left hip: Secondary | ICD-10-CM

## 2021-09-16 DIAGNOSIS — F5089 Other specified eating disorder: Secondary | ICD-10-CM

## 2021-09-16 DIAGNOSIS — Z6833 Body mass index (BMI) 33.0-33.9, adult: Secondary | ICD-10-CM

## 2021-09-16 DIAGNOSIS — E669 Obesity, unspecified: Secondary | ICD-10-CM | POA: Diagnosis not present

## 2021-09-16 MED ORDER — ESCITALOPRAM OXALATE 10 MG PO TABS: 10.0000 mg | ORAL_TABLET | Freq: Every day | ORAL | 0 refills | Status: DC

## 2021-09-16 MED ORDER — BUPROPION HCL ER (SR) 200 MG PO TB12: 200.0000 mg | ORAL_TABLET | Freq: Every day | ORAL | 0 refills | Status: DC

## 2021-09-16 NOTE — Progress Notes (Signed)
Chief Complaint:   OBESITY Heather Hayes is here to discuss her progress with her obesity treatment plan along with follow-up of her obesity related diagnoses. [name] is on the Category 2 Plan and states she is following her eating plan approximately 60% of the time. [name] states she is doing physical therapy for 20 minutes 7 times per week.  Today's visit was #: 3 Starting weight: 191 lbs Starting date: 05/15/2019 Today's weight: 171 lbs Today's date: 09/16/2021 Total lbs lost to date: 20 lbs Total lbs lost since last in-office visit: 0  Interim History: [name] notes her goal weight is 162-165 lbs. She says she is finding it hard to commit to adhering to plan well enough to meet her goal weight. She tends to exceed her extra calorie allotment.   Subjective:   1. Bursitis of left hip, unspecified bursa [name] recently saw ortho for hip pain and was diagnosed with bursitis. She had a hip injection and pain has improved.  2. Other disorder of eating/emotional eating [name] tends to struggles with food cravings. Bupropion worked well at first but is no longer working Assessment/Plan:   1. Bursitis of left hip, unspecified bursa Ernesto will follow up with ortho as needed.   2. Other disorder of eating/emotional eating [name] agrees to increase dose bupropion to 200 mg daily and she will continue Lexapro 10 mg daily. - escitalopram (LEXAPRO) 10 MG tablet; Take 1 tablet (10 mg total) by mouth daily.  Dispense: 30 tablet; Refill: 0 - buPROPion (WELLBUTRIN SR) 200 MG 12 hr tablet; Take 1 tablet (200 mg total) by mouth daily with breakfast.  Dispense: 30 tablet; Refill: 0  3. Obesity with current BMI 30.3 [name] is currently in the action stage of change. As such, her goal is to continue with weight loss efforts. She has agreed to the Category 2 Plan.   Exercise goals:  As is.  Behavioral modification strategies: decreasing simple carbohydrates and emotional eating strategies.  [name] has agreed to  follow-up with our clinic in 4 weeks with Dr. Leafy Ro.  Objective:   Blood pressure 116/72, pulse 64, temperature 97.7 F (36.5 C), height 5\' 3"  (1.6 m), weight 171 lb (77.6 kg), SpO2 99 %. Body mass index is 30.29 kg/m.  General: Cooperative, alert, well developed, in no acute distress. HEENT: Conjunctivae and lids unremarkable. Cardiovascular: Regular rhythm.  Lungs: Normal work of breathing. Neurologic: No focal deficits.   Lab Results  Component Value Date   CREATININE 0.75 05/26/2021   BUN 15 05/26/2021   NA 142 05/26/2021   K 4.9 05/26/2021   CL 104 05/26/2021   CO2 25 05/26/2021   Lab Results  Component Value Date   ALT 44 (H) 05/26/2021   AST 45 (H) 05/26/2021   ALKPHOS 117 05/26/2021   BILITOT 0.5 05/26/2021   Lab Results  Component Value Date   HGBA1C 5.5 05/26/2021   HGBA1C 5.6 09/29/2020   HGBA1C 5.5 01/22/2020   HGBA1C 5.5 09/05/2019   HGBA1C 5.4 05/15/2019   Lab Results  Component Value Date   INSULIN 6.7 05/26/2021   INSULIN 6.5 09/29/2020   INSULIN 8.0 05/20/2020   INSULIN 7.5 01/22/2020   INSULIN 8.3 09/05/2019   Lab Results  Component Value Date   TSH 1.560 05/15/2019   Lab Results  Component Value Date   CHOL 193 05/26/2021   HDL 68 05/26/2021   LDLCALC 100 (H) 05/26/2021   TRIG 147 05/26/2021   CHOLHDL 2.8 05/20/2020   Lab Results  Component Value Date   VD25OH 86.4 05/26/2021   VD25OH 77.6 09/29/2020   VD25OH 51.7 01/22/2020   Lab Results  Component Value Date   WBC 5.1 09/29/2020   HGB 13.6 01/01/2021   HCT 40.8 09/29/2020   MCV 92 09/29/2020   PLT 275 09/29/2020   No results found for: IRON, TIBC, FERRITIN  Attestation Statements:   Reviewed by clinician on day of visit: allergies, medications, problem list, medical history, surgical history, family history, social history, and previous encounter notes.  I, Lizbeth Bark, RMA, am acting as Location manager for [employer], [place].  I have reviewed the above  documentation for accuracy and completeness, and I agree with the above. -  Georgianne Fick, FNP

## 2021-09-17 DIAGNOSIS — F509 Eating disorder, unspecified: Secondary | ICD-10-CM | POA: Insufficient documentation

## 2021-09-30 DIAGNOSIS — Z0000 Encounter for general adult medical examination without abnormal findings: Secondary | ICD-10-CM | POA: Diagnosis not present

## 2021-09-30 DIAGNOSIS — M5441 Lumbago with sciatica, right side: Secondary | ICD-10-CM | POA: Diagnosis not present

## 2021-09-30 DIAGNOSIS — M25551 Pain in right hip: Secondary | ICD-10-CM | POA: Diagnosis not present

## 2021-10-07 ENCOUNTER — Other Ambulatory Visit: Payer: Self-pay | Admitting: Nurse Practitioner

## 2021-10-09 ENCOUNTER — Other Ambulatory Visit: Payer: Self-pay | Admitting: *Deleted

## 2021-10-09 ENCOUNTER — Other Ambulatory Visit (INDEPENDENT_AMBULATORY_CARE_PROVIDER_SITE_OTHER): Payer: Self-pay | Admitting: Family Medicine

## 2021-10-09 DIAGNOSIS — F5089 Other specified eating disorder: Secondary | ICD-10-CM

## 2021-10-09 DIAGNOSIS — Z170 Estrogen receptor positive status [ER+]: Secondary | ICD-10-CM

## 2021-10-09 DIAGNOSIS — C50411 Malignant neoplasm of upper-outer quadrant of right female breast: Secondary | ICD-10-CM

## 2021-10-12 ENCOUNTER — Encounter (INDEPENDENT_AMBULATORY_CARE_PROVIDER_SITE_OTHER): Payer: Self-pay | Admitting: Family Medicine

## 2021-10-12 ENCOUNTER — Ambulatory Visit (INDEPENDENT_AMBULATORY_CARE_PROVIDER_SITE_OTHER): Payer: BC Managed Care – PPO | Admitting: Family Medicine

## 2021-10-12 ENCOUNTER — Inpatient Hospital Stay: Payer: BC Managed Care – PPO | Attending: Nurse Practitioner | Admitting: Hematology

## 2021-10-12 ENCOUNTER — Encounter: Payer: Self-pay | Admitting: Hematology

## 2021-10-12 ENCOUNTER — Ambulatory Visit
Admission: RE | Admit: 2021-10-12 | Discharge: 2021-10-12 | Disposition: A | Discharge status: Home / Self Care | Payer: BC Managed Care – PPO | Source: Ambulatory Visit | Attending: Nurse Practitioner | Admitting: Nurse Practitioner

## 2021-10-12 ENCOUNTER — Inpatient Hospital Stay: Payer: BC Managed Care – PPO

## 2021-10-12 ENCOUNTER — Other Ambulatory Visit: Payer: Self-pay

## 2021-10-12 VITALS — BP 141/68 | HR 72 | Temp 98.2°F | Resp 16 | Ht 63.0 in | Wt 180.0 lb

## 2021-10-12 VITALS — BP 126/77 | HR 68 | Temp 98.1°F | Ht 63.0 in | Wt 173.0 lb

## 2021-10-12 DIAGNOSIS — Z170 Estrogen receptor positive status [ER+]: Secondary | ICD-10-CM | POA: Diagnosis not present

## 2021-10-12 DIAGNOSIS — M1990 Unspecified osteoarthritis, unspecified site: Secondary | ICD-10-CM | POA: Diagnosis not present

## 2021-10-12 DIAGNOSIS — F5089 Other specified eating disorder: Secondary | ICD-10-CM

## 2021-10-12 DIAGNOSIS — Z79899 Other long term (current) drug therapy: Secondary | ICD-10-CM | POA: Insufficient documentation

## 2021-10-12 DIAGNOSIS — C50411 Malignant neoplasm of upper-outer quadrant of right female breast: Secondary | ICD-10-CM | POA: Insufficient documentation

## 2021-10-12 DIAGNOSIS — Z6830 Body mass index (BMI) 30.0-30.9, adult: Secondary | ICD-10-CM | POA: Diagnosis not present

## 2021-10-12 DIAGNOSIS — E669 Obesity, unspecified: Secondary | ICD-10-CM | POA: Diagnosis not present

## 2021-10-12 DIAGNOSIS — E66811 Obesity, class 1: Secondary | ICD-10-CM

## 2021-10-12 DIAGNOSIS — Z1231 Encounter for screening mammogram for malignant neoplasm of breast: Secondary | ICD-10-CM

## 2021-10-12 DIAGNOSIS — Z923 Personal history of irradiation: Secondary | ICD-10-CM | POA: Insufficient documentation

## 2021-10-12 DIAGNOSIS — Z9189 Other specified personal risk factors, not elsewhere classified: Secondary | ICD-10-CM | POA: Diagnosis not present

## 2021-10-12 DIAGNOSIS — Z79811 Long term (current) use of aromatase inhibitors: Secondary | ICD-10-CM | POA: Insufficient documentation

## 2021-10-12 LAB — CBC WITH DIFFERENTIAL (CANCER CENTER ONLY)
Abs Immature Granulocytes: 0.02 10*3/uL (ref 0.00–0.07)
Basophils Absolute: 0.1 10*3/uL (ref 0.0–0.1)
Basophils Relative: 1 %
Eosinophils Absolute: 0.1 10*3/uL (ref 0.0–0.5)
Eosinophils Relative: 2 %
HCT: 40.6 % (ref 36.0–46.0)
Hemoglobin: 13.2 g/dL (ref 12.0–15.0)
Immature Granulocytes: 0 %
Lymphocytes Relative: 21 %
Lymphs Abs: 1.4 10*3/uL (ref 0.7–4.0)
MCH: 30.6 pg (ref 26.0–34.0)
MCHC: 32.5 g/dL (ref 30.0–36.0)
MCV: 94.2 fL (ref 80.0–100.0)
Monocytes Absolute: 0.4 10*3/uL (ref 0.1–1.0)
Monocytes Relative: 6 %
Neutro Abs: 4.8 10*3/uL (ref 1.7–7.7)
Neutrophils Relative %: 70 %
Platelet Count: 254 10*3/uL (ref 150–400)
RBC: 4.31 MIL/uL (ref 3.87–5.11)
RDW: 13.2 % (ref 11.5–15.5)
WBC Count: 6.8 10*3/uL (ref 4.0–10.5)
nRBC: 0 % (ref 0.0–0.2)

## 2021-10-12 LAB — CMP (CANCER CENTER ONLY)
ALT: 13 U/L (ref 0–44)
AST: 18 U/L (ref 15–41)
Albumin: 4.3 g/dL (ref 3.5–5.0)
Alkaline Phosphatase: 82 U/L (ref 38–126)
Anion gap: 6 (ref 5–15)
BUN: 21 mg/dL — ABNORMAL HIGH (ref 6–20)
CO2: 30 mmol/L (ref 22–32)
Calcium: 9.9 mg/dL (ref 8.9–10.3)
Chloride: 104 mmol/L (ref 98–111)
Creatinine: 0.69 mg/dL (ref 0.44–1.00)
GFR, Estimated: >60 mL/min (ref >60)
Glucose, Bld: 112 mg/dL — ABNORMAL HIGH (ref 70–99)
Potassium: 3.7 mmol/L (ref 3.5–5.1)
Sodium: 140 mmol/L (ref 135–145)
Total Bilirubin: 0.5 mg/dL (ref 0.3–1.2)
Total Protein: 7.1 g/dL (ref 6.5–8.1)

## 2021-10-12 MED ORDER — BUPROPION HCL ER (SR) 200 MG PO TB12: 200.0000 mg | ORAL_TABLET | Freq: Every day | ORAL | 0 refills | Status: DC

## 2021-10-12 NOTE — Progress Notes (Signed)
[facility]   Telephone:([phone] Fax:([phone]) [phone]   Clinic Follow up Note   Patient Care Team: Molli Posey, MD as PCP - General (Obstetrics and Gynecology) Mauro Kaufmann, RN as Oncology Nurse Navigator Rockwell Germany, RN as Oncology Nurse Navigator Excell Seltzer, MD (Inactive) as Consulting Physician (General Surgery) Truitt Merle, MD as Consulting Physician (Hematology) Gery Pray, MD as Consulting Physician (Radiation Oncology) Alla Feeling, NP as Nurse Practitioner (Nurse Practitioner)  Date of Service:  10/12/2021  CHIEF COMPLAINT: f/u of right breast cancer  CURRENT THERAPY:  Antiestrogen therapy starting 03/2019, currently exemestane since 05/02/21  ASSESSMENT & PLAN:  Heather Hayes is a 61 y.o. postmenopausal female with   1. Malignant neoplasm of upper-outer quadrant of right breast, Stage IA, p (T1b,N0,M0), ER/PR+, HER2-, Grade II -Diagnosed in 10/2018, s/p right lumpectomy and radiation. -She began antiestrogen therapy with anastrozole on 03/2019, but experienced hot flashes, worsening joint pain, low libido, and brittle nails. She was switched to exemestane on 05/02/21, tolerating well. -most recent mammogram 10/09/20 was negative.  -mammogram was scheduled for today, but she reports a new nodule in her right breast. She is being rescheduled for diagnostic MM and [name]. On physical exam, this feels like scar tissue to me. Will await MM and [name]. -she is otherwise doing clinically well. Labs reviewed, overall WNL.   2. Bone Health  -Her 10/25/17 and 10/08/20 DEXA scans were normal  -She is aware of AI's can reduce her bone density. -Continue calcium/vitamin D daily Monday through Friday, and weightbearing exercise  -repeat in 10/2022    3.  Genetic testing 2019, negative   4.  Arthritis and bursitis of her hips -Previously stable on AI, controlled with meloxicam -Improved on exemestane -Continue monitoring    PLAN: -Continue exemestane  and surveillance -continue calcium and vitamin D  -mammogram soon -lab and f/u with NP Lacie in 6 months   No problem-specific Assessment & Plan notes found for this encounter.   SUMMARY OF ONCOLOGIC HISTORY: Oncology History Overview Note  Cancer Staging Malignant neoplasm of upper-outer quadrant of right breast in female, estrogen receptor positive ([place]) Staging form: Breast, AJCC 8th Edition - Clinical stage from 10/02/2018: Stage IA (cT1b, cN0, cM0, G2, ER+, PR+, HER2-) - Signed by Truitt Merle, MD on 10/10/2018 - Pathologic stage from 10/27/2018: Stage IA (pT1b, pN0, cM0, G1, ER+, PR+, HER2-) - Signed by Truitt Merle, MD on 02/26/2019     Malignant neoplasm of upper-outer quadrant of right breast in female, estrogen receptor positive ([place])  09/27/2018 Mammogram   [facility] Mammogram 09/27/18  IMPRESSION: 1. Suspicious right breast mass measures 7 x 7 x 6 mm with associated vascularity at the 12 o'clock position 7 cm from the nipple. 2. No suspicious right axillary lymphadenopathy.   10/02/2018 Cancer Staging   Staging form: Breast, AJCC 8th Edition - Clinical stage from 10/02/2018: Stage IA (cT1b, cN0, cM0, G2, ER+, PR+, HER2-) - Signed by Truitt Merle, MD on 10/10/2018    10/02/2018 Initial Biopsy   Diagnosis 10/02/18 Breast, right, needle core biopsy, 12 o'clock - INVASIVE DUCTAL CARCINOMA.   10/02/2018 Receptors her2   Results: IMMUNOHISTOCHEMICAL AND MORPHOMETRIC ANALYSIS PERFORMED MANUALLY The tumor cells are NEGATIVE for Her2 (1+). Estrogen Receptor: 95%, POSITIVE, STRONG STAINING INTENSITY Progesterone Receptor: 95%, POSITIVE, STRONG STAINING INTENSITY Proliferation Marker Ki67: 5%   10/04/2018 Initial Diagnosis   Malignant neoplasm of upper-outer quadrant of right breast in female, estrogen receptor positive ([place])   10/27/2018 Surgery  RIGHT BREAST LUMPECTOMY WITH RADIOACTIVE SEED AND SENTINEL LYMPH NODE BIOPSY by Dr Excell Seltzer   10/27/2018 Pathology Results   Diagnosis 10/27/18 1.  Breast, lumpectomy, Right - INVASIVE DUCTAL CARCINOMA WITH CALCIFICATIONS, GRADE I/III, SPANNING 0.7 CM. - THE SURGICAL RESECTION MARGIN ARE NEGATIVE FOR CARCINOMA. - SEE ONCOLOGY TABLE BELOW. 2. Lymph node, sentinel, biopsy, Right - THERE IS NO EVIDENCE OF CARCINOMA IN 1 OF 1 LYMPH NODE (0/1). 3. Lymph node, sentinel, biopsy, Right - THERE IS NO EVIDENCE OF CARCINOMA IN 1 OF 1 LYMPH NODE (0/1).    - 01/29/2019 Radiation Therapy   Radiation therapy at [facility] on 01/29/19.    10/27/2018 Cancer Staging   Staging form: Breast, AJCC 8th Edition - Pathologic stage from 10/27/2018: Stage IA (pT1b, pN0, cM0, G1, ER+, PR+, HER2-) - Signed by Truitt Merle, MD on 02/26/2019    03/2019 -  Anti-estrogen oral therapy   Anastrozole 71m daily starting in 03/2019    07/09/2019 Survivorship   SCP delivered by LCira Rue NP       INTERVAL HISTORY:  Heather Hayes here for a follow up of breast cancer. She was last seen by NP Lacie on 07/14/21. She presents to the clinic alone. She reports doing well overall. She does note new palpable area to the right breast.    All other systems were reviewed with the patient and are negative.  MEDICAL HISTORY:  Past Medical History:  Diagnosis Date   Anxiety    Arthritis    Back pain    Bursitis    [place]    right breast cancer 09-2018   Constipation    GERD (gastroesophageal reflux disease)    some heart burn from Arimidex   History of radiation therapy    completed 01-29-2019   Hyperlipidemia    Joint pain    Personal history of radiation therapy    Rheumatic fever    Swallowing difficulty     SURGICAL HISTORY: Past Surgical History:  Procedure Laterality Date   BREAST LUMPECTOMY Right 10/27/2018   BREAST LUMPECTOMY WITH RADIOACTIVE SEED AND SENTINEL LYMPH NODE BIOPSY Right 10/27/2018   Procedure: RIGHT BREAST LUMPECTOMY WITH RADIOACTIVE SEED AND SENTINEL LYMPH NODE BIOPSY;  Surgeon: HExcell Seltzer MD;  [facility]  Service: General;  Laterality: Right;   CERVICAL SPINE SURGERY  2009   COLONOSCOPY     ENDOMETRIAL ABLATION     POLYPECTOMY     TUBAL LIGATION     tube ligation       I have reviewed the social history and family history with the patient and they are unchanged from previous note.  ALLERGIES:  is allergic to compazine [prochlorperazine edisylate].  MEDICATIONS:  Current Outpatient Medications  Medication Sig Dispense Refill   buPROPion (WELLBUTRIN SR) 200 MG 12 hr tablet Take 1 tablet (200 mg total) by mouth daily with breakfast. 30 tablet 0   Cholecalciferol (VITAMIN D) 125 MCG (5000 UT) CAPS PO 5 days per week. 30 capsule 0   clonazePAM (KLONOPIN) 0.5 MG tablet Take 0.5 mg by mouth 2 (two) times daily as needed for anxiety.     escitalopram (LEXAPRO) 10 MG tablet Take 1 tablet (10 mg total) by mouth daily. 30 tablet 0   exemestane (AROMASIN) 25 MG tablet TAKE 1 TABLET (25 MG TOTAL) BY MOUTH DAILY AFTER BREAKFAST. 90 tablet 2   meloxicam (MOBIC) 15 MG tablet Take 15 mg by mouth daily.     No current facility-administered  medications for this visit.    PHYSICAL EXAMINATION: ECOG PERFORMANCE STATUS: 0 - Asymptomatic  Vitals:   10/12/21 1355  BP: (!) 141/68  Pulse: 72  Resp: 16  Temp: 98.2 F (36.8 C)  SpO2: 100%   Wt Readings from Last 3 Encounters:  10/12/21 180 lb (81.6 kg)  10/12/21 173 lb (78.5 kg)  09/16/21 171 lb (77.6 kg)     GENERAL:alert, no distress and comfortable SKIN: skin color, texture, turgor are normal, no rashes or significant lesions EYES: normal, Conjunctiva are pink and non-injected, sclera clear  NECK: supple, thyroid normal size, non-tender, without nodularity LYMPH:  no palpable lymphadenopathy in the cervical, axillary ABDOMEN:abdomen soft, non-tender and normal bowel sounds Musculoskeletal:no cyanosis of digits and no clubbing  NEURO: alert & oriented x 3 with fluent speech, no focal motor/sensory deficits BREAST: palpable  nodule near 12 o'clock position of right breast, likely scar tissue. Left breast benign.  LABORATORY DATA:  I have reviewed the data as listed CBC Latest Ref Rng & Units 10/12/2021 01/01/2021 09/29/2020  WBC 4.0 - 10.5 K/uL 6.8 - 5.1  Hemoglobin 12.0 - 15.0 g/dL 13.2 13.6 13.7  Hematocrit 36.0 - 46.0 % 40.6 - 40.8  Platelets 150 - 400 K/uL 254 - 275     CMP Latest Ref Rng & Units 10/12/2021 05/26/2021 09/29/2020  Glucose 70 - 99 mg/dL 112(H) 88 87  BUN 6 - 20 mg/dL 21(H) 15 20  Creatinine 0.44 - 1.00 mg/dL 0.69 0.75 0.72  Sodium 135 - 145 mmol/L 140 142 136  Potassium 3.5 - 5.1 mmol/L 3.7 4.9 4.6  Chloride 98 - 111 mmol/L 104 104 99  CO2 22 - 32 mmol/L '30 25 23  ' Calcium 8.9 - 10.3 mg/dL 9.9 10.2 10.0  Total Protein 6.5 - 8.1 g/dL 7.1 7.2 7.2  Total Bilirubin 0.3 - 1.2 mg/dL 0.5 0.5 0.4  Alkaline Phos 38 - 126 U/L 82 117 99  AST 15 - 41 U/L 18 45(H) 19  ALT 0 - 44 U/L 13 44(H) 23      RADIOGRAPHIC STUDIES: I have personally reviewed the radiological images as listed and agreed with the findings in the report. No results found.    No orders of the defined types were placed in this encounter.  All questions were answered. The patient knows to call the clinic with any problems, questions or concerns. No barriers to learning was detected. The total time spent in the appointment was 25 minutes.     Truitt Merle, MD 10/12/2021   I, Wilburn Mylar, am acting as scribe for Truitt Merle, MD.   I have reviewed the above documentation for accuracy and completeness, and I agree with the above.

## 2021-10-13 NOTE — Progress Notes (Signed)
? ? ? ?Chief Complaint:  ? ?OBESITY ?Heather Hayes is here to discuss her progress with her obesity treatment plan along with follow-up of her obesity related diagnoses. Heather Hayes is on the Category 2 Plan and states she is following her eating plan approximately 60% of the time. Heather Hayes states she is walking/physical therapy for 15-20 minutes 2-3 times per week. ? ?Today's visit was #: 64 ?Starting weight: 191 lbs ?Starting date: 06/01/2019 ?Today's weight: 173 lbs ?Today's date: 10/12/2021 ?Total lbs lost to date: 89 ?Total lbs lost since last in-office visit: 0 ? ?Interim History: Heather Hayes was on vacation at [employer]. She did try to make better choices, but she was limited in protein and her Na+ had increased. She did a lot of walking however and she plans to keep walking most days this Spring. ? ?Subjective:  ? ?1. Other disorder of eating/emotional eating ?Heather Hayes is doing well on her medications and she is working on [employer] behaviors. No side effects were noted. ? ?2. At risk for impaired metabolic function ?Heather Hayes is at increased risk for impaired metabolic function if protein decreases. ? ?Assessment/Plan:  ? ?1. Other disorder of eating/emotional eating ?We will refill Wellbutrin SR for 1 month. Heather Hayes will continue Lexapro. She is to make sure to ask her Oncologist if they see any problems with her continuing these medications. ? ?- buPROPion (WELLBUTRIN SR) 200 MG 12 hr tablet; Take 1 tablet (200 mg total) by mouth daily with breakfast.  Dispense: 30 tablet; Refill: 0 ? ?2. At risk for impaired metabolic function ?Heather Hayes was given approximately 15 minutes of impaired  metabolic function prevention counseling today. We discussed intensive lifestyle modifications today with an emphasis on specific nutrition and exercise instructions and strategies.  ? ?Repetitive spaced learning was employed today to elicit superior memory formation and behavioral change. ? ?3. Obesity with current BMI 30.7 ?Heather Hayes is currently in  the action stage of change. As such, her goal is to continue with weight loss efforts. She has agreed to the Category 2 Plan.  ? ?We will recheck fasting labs at her next visit. ? ?Exercise goals: As is. ? ?Behavioral modification strategies: increasing lean protein intake. ? ?Heather Hayes has agreed to follow-up with our clinic in 4 weeks. She was informed of the importance of frequent follow-up visits to maximize her success with intensive lifestyle modifications for her multiple health conditions.  ? ?Objective:  ? ?Blood pressure 126/77, pulse 68, temperature 98.1 ?F (36.7 ?C), height '5\' 3"'$  (1.6 m), weight 173 lb (78.5 kg), SpO2 99 %. ?Body mass index is 30.65 kg/m?. ? ?General: Cooperative, alert, well developed, in no acute distress. ?HEENT: Conjunctivae and lids unremarkable. ?Cardiovascular: Regular rhythm.  ?Lungs: Normal work of breathing. ?Neurologic: No focal deficits.  ? ?Lab Results  ?Component Value Date  ? CREATININE 0.69 10/12/2021  ? BUN 21 (H) 10/12/2021  ? NA 140 10/12/2021  ? K 3.7 10/12/2021  ? CL 104 10/12/2021  ? CO2 30 10/12/2021  ? ?Lab Results  ?Component Value Date  ? ALT 13 10/12/2021  ? AST 18 10/12/2021  ? ALKPHOS 82 10/12/2021  ? BILITOT 0.5 10/12/2021  ? ?Lab Results  ?Component Value Date  ? HGBA1C 5.5 05/26/2021  ? HGBA1C 5.6 09/29/2020  ? HGBA1C 5.5 01/22/2020  ? HGBA1C 5.5 09/05/2019  ? HGBA1C 5.4 05/15/2019  ? ?Lab Results  ?Component Value Date  ? INSULIN 6.7 05/26/2021  ? INSULIN 6.5 09/29/2020  ? INSULIN 8.0 05/20/2020  ? INSULIN 7.5 01/22/2020  ?  INSULIN 8.3 09/05/2019  ? ?Lab Results  ?Component Value Date  ? TSH 1.560 05/15/2019  ? ?Lab Results  ?Component Value Date  ? CHOL 193 05/26/2021  ? HDL 68 05/26/2021  ? [place] 100 (H) 05/26/2021  ? TRIG 147 05/26/2021  ? CHOLHDL 2.8 05/20/2020  ? ?Lab Results  ?Component Value Date  ? VD25OH 86.4 05/26/2021  ? VD25OH 77.6 09/29/2020  ? VD25OH 51.7 01/22/2020  ? ?Lab Results  ?Component Value Date  ? WBC 6.8 10/12/2021  ? HGB 13.2  10/12/2021  ? HCT 40.6 10/12/2021  ? MCV 94.2 10/12/2021  ? PLT 254 10/12/2021  ? ?No results found for: IRON, TIBC, FERRITIN ? ?Attestation Statements:  ? ?Reviewed by clinician on day of visit: allergies, medications, problem list, medical history, surgical history, family history, social history, and previous encounter notes. ? ? ?I, Trixie Dredge , am acting as transcriptionist for Dennard Nip, MD. ? ?I have reviewed the above documentation for accuracy and completeness, and I agree with the above. -  Dennard Nip, MD ? ? ?

## 2021-10-14 ENCOUNTER — Other Ambulatory Visit: Payer: Self-pay | Admitting: Hematology

## 2021-10-14 DIAGNOSIS — Z853 Personal history of malignant neoplasm of breast: Secondary | ICD-10-CM

## 2021-10-26 ENCOUNTER — Ambulatory Visit: Payer: BC Managed Care – PPO

## 2021-10-26 ENCOUNTER — Ambulatory Visit
Admission: RE | Admit: 2021-10-26 | Discharge: 2021-10-26 | Disposition: A | Discharge status: Home / Self Care | Payer: BC Managed Care – PPO | Source: Ambulatory Visit | Attending: Hematology | Admitting: Hematology

## 2021-10-26 DIAGNOSIS — R922 Inconclusive mammogram: Secondary | ICD-10-CM | POA: Diagnosis not present

## 2021-10-26 DIAGNOSIS — Z853 Personal history of malignant neoplasm of breast: Secondary | ICD-10-CM | POA: Diagnosis not present

## 2021-11-05 ENCOUNTER — Other Ambulatory Visit (INDEPENDENT_AMBULATORY_CARE_PROVIDER_SITE_OTHER): Payer: Self-pay | Admitting: Family Medicine

## 2021-11-05 DIAGNOSIS — F5089 Other specified eating disorder: Secondary | ICD-10-CM

## 2021-11-07 ENCOUNTER — Other Ambulatory Visit (INDEPENDENT_AMBULATORY_CARE_PROVIDER_SITE_OTHER): Payer: Self-pay | Admitting: Family Medicine

## 2021-11-07 DIAGNOSIS — F5089 Other specified eating disorder: Secondary | ICD-10-CM

## 2021-11-09 ENCOUNTER — Encounter (INDEPENDENT_AMBULATORY_CARE_PROVIDER_SITE_OTHER): Payer: Self-pay | Admitting: Bariatrics

## 2021-11-09 ENCOUNTER — Ambulatory Visit (INDEPENDENT_AMBULATORY_CARE_PROVIDER_SITE_OTHER): Payer: BC Managed Care – PPO | Admitting: Bariatrics

## 2021-11-09 VITALS — BP 122/77 | HR 60 | Temp 97.5°F | Ht 63.0 in | Wt 172.0 lb

## 2021-11-09 DIAGNOSIS — R5383 Other fatigue: Secondary | ICD-10-CM

## 2021-11-09 DIAGNOSIS — E8881 Metabolic syndrome: Secondary | ICD-10-CM | POA: Diagnosis not present

## 2021-11-09 DIAGNOSIS — E782 Mixed hyperlipidemia: Secondary | ICD-10-CM

## 2021-11-09 DIAGNOSIS — R0602 Shortness of breath: Secondary | ICD-10-CM

## 2021-11-09 DIAGNOSIS — E559 Vitamin D deficiency, unspecified: Secondary | ICD-10-CM | POA: Diagnosis not present

## 2021-11-09 DIAGNOSIS — Z6830 Body mass index (BMI) 30.0-30.9, adult: Secondary | ICD-10-CM

## 2021-11-09 DIAGNOSIS — E669 Obesity, unspecified: Secondary | ICD-10-CM

## 2021-11-09 DIAGNOSIS — F5089 Other specified eating disorder: Secondary | ICD-10-CM

## 2021-11-09 MED ORDER — ESCITALOPRAM OXALATE 10 MG PO TABS: 10.0000 mg | ORAL_TABLET | Freq: Every day | ORAL | 0 refills | Status: DC

## 2021-11-09 NOTE — Progress Notes (Signed)
? ? ? ?Chief Complaint:  ? ?OBESITY ?Larry is here to discuss her progress with her obesity treatment plan along with follow-up of her obesity related diagnoses. Clarene is on the Category 2 Plan and states she is following her eating plan approximately 60% of the time. Dayami states she is walking, strength training and gardening for 15 minutes 2-3 times per week. ? ?Today's visit was #: 36 ?Starting weight: 191 lbs ?Starting date: 06/01/2019 ?Today's weight: 172 lbs ?Today's date: 11/09/2021 ?Total lbs lost to date: 19 lbs ?Total lbs lost since last in-office visit: 1 lb ? ?Interim History: Leatha is down 1 lb. She stopped the Wellbutrin as she felt it was not working well.  ? ?Subjective:  ? ?1. Other fatigue ?Rakia notes fatigue.  ? ?2. SOB (shortness of breath) on exertion ?Racine notes some shortness of breath on exertion. ? ?3. Insulin resistance ?Jacquel is not on medications currently.  ? ?4. Vitamin D deficiency ?Jiali is currently taking Vitamin D.  ? ?5. Mixed hyperlipidemia ?Kayleana is not on medications currently.  ? ?6. Other disorder of eating/emotional eating ?Trenesha stopped taking Lexapro.  ? ?Assessment/Plan:  ? ?1. Other fatigue ?We will check TSH today. Briar does feel that her weight is causing her energy to be lower than it should be. Fatigue may be related to obesity, depression or many other causes. Labs will be ordered, and in the meanwhile, Viola will focus on self care including making healthy food choices, increasing physical activity and focusing on stress reduction.  ?- TSH+T4F+T3Free ? ?2. SOB (shortness of breath) on exertion ?Marlyne does feel that she gets out of breath more easily that she used to when she exercises. Kateena's shortness of breath appears to be obesity related and exercise induced. She has agreed to work on weight loss and gradually increase exercise to treat her exercise induced shortness of breath. Will continue to monitor closely.  ? ?3. Insulin resistance ?Netra will keep all carbohydrates  minimal (starches and sweets). We will check A1C and insulin today. continue to work on weight loss, exercise, and decreasing simple carbohydrates to help decrease the risk of diabetes. Felina agreed to follow-up with [name] as directed to closely monitor her progress. ? ?- Hemoglobin A1c ?- Insulin, random ? ?4. Vitamin D deficiency ?Low Vitamin D level contributes to fatigue and are associated with obesity, breast, and colon cancer. We will check Vitamin D today and Saraya will follow-up for routine testing of Vitamin D, at least 2-3 times per year to avoid over-replacement. ? ?- VITAMIN D 25 Hydroxy (Vit-D Deficiency, Fractures) ? ?5. Mixed hyperlipidemia ?Cardiovascular risk and specific lipid/LDL goals reviewed.  We will check Lipid panel today. We discussed several lifestyle modifications today and Carrah will continue to work on diet, exercise and weight loss efforts. Orders and follow up as documented in patient record.  ? ?Counseling ?Intensive lifestyle modifications are the first line treatment for this issue. ?Dietary changes: Increase soluble fiber. Decrease simple carbohydrates. ?Exercise changes: Moderate to vigorous-intensity aerobic activity 150 minutes per week if tolerated. ?Lipid-lowering medications: see documented in medical record. ? ?- Lipid Panel With LDL/HDL Ratio ? ? ?6. Other disorder of eating/emotional eating ?Ernesha agrees to restart Lexapro. We will refill Lexapro 10 mg for 3 months with no refills. Behavior modification techniques were discussed today to help Galen deal with her emotional/non-hunger eating behaviors.  Orders and follow up as documented in patient record.  ? ?- escitalopram (LEXAPRO) 10 MG tablet; Take 1 tablet (10 mg  total) by mouth daily.  Dispense: 90 tablet; Refill: 0 ? ?7. Obesity with current BMI 30.6 ?Corlene is currently in the action stage of change. As such, her goal is to continue with weight loss efforts. She has agreed to the Category 2 Plan and keeping a food journal  and adhering to recommended goals of 1200 calories and 80 grams of protein.  ? ?We will review labs from 10/12/2021 CMP, CBC, and glucose. Kadeja will start to journal.  ? ?Exercise goals:  As is. ? ?Behavioral modification strategies: increasing lean protein intake, decreasing simple carbohydrates, increasing vegetables, increasing water intake, decreasing eating out, no skipping meals, meal planning and cooking strategies, keeping healthy foods in the home, and planning for success. ? ?Marcea has agreed to follow-up with our clinic in 4 weeks with nurse practitioner and 8 weeks with myself. She was informed of the importance of frequent follow-up visits to maximize her success with intensive lifestyle modifications for her multiple health conditions.  ? ?Madison was informed we would discuss her lab results at her next visit unless there is a critical issue that needs to be addressed sooner. Rosali agreed to keep her next visit at the agreed upon time to discuss these results. ? ?Objective:  ? ?Blood pressure 122/77, pulse 60, temperature (!) 97.5 ?F (36.4 ?C), temperature source Oral, height '5\' 3"'$  (1.6 m), weight 172 lb (78 kg), SpO2 98 %. ?Body mass index is 30.47 kg/m?. ? ?General: Cooperative, alert, well developed, in no acute distress. ?HEENT: Conjunctivae and lids unremarkable. ?Cardiovascular: Regular rhythm.  ?Lungs: Normal work of breathing. ?Neurologic: No focal deficits.  ? ?Lab Results  ?Component Value Date  ? CREATININE 0.69 10/12/2021  ? BUN 21 (H) 10/12/2021  ? NA 140 10/12/2021  ? K 3.7 10/12/2021  ? CL 104 10/12/2021  ? CO2 30 10/12/2021  ? ?Lab Results  ?Component Value Date  ? ALT 13 10/12/2021  ? AST 18 10/12/2021  ? ALKPHOS 82 10/12/2021  ? BILITOT 0.5 10/12/2021  ? ?Lab Results  ?Component Value Date  ? HGBA1C 5.5 05/26/2021  ? HGBA1C 5.6 09/29/2020  ? HGBA1C 5.5 01/22/2020  ? HGBA1C 5.5 09/05/2019  ? HGBA1C 5.4 05/15/2019  ? ?Lab Results  ?Component Value Date  ? INSULIN 6.7 05/26/2021  ? INSULIN  6.5 09/29/2020  ? INSULIN 8.0 05/20/2020  ? INSULIN 7.5 01/22/2020  ? INSULIN 8.3 09/05/2019  ? ?Lab Results  ?Component Value Date  ? TSH 1.560 05/15/2019  ? ?Lab Results  ?Component Value Date  ? CHOL 193 05/26/2021  ? HDL 68 05/26/2021  ? [place] 100 (H) 05/26/2021  ? TRIG 147 05/26/2021  ? CHOLHDL 2.8 05/20/2020  ? ?Lab Results  ?Component Value Date  ? VD25OH 86.4 05/26/2021  ? VD25OH 77.6 09/29/2020  ? VD25OH 51.7 01/22/2020  ? ?Lab Results  ?Component Value Date  ? WBC 6.8 10/12/2021  ? HGB 13.2 10/12/2021  ? HCT 40.6 10/12/2021  ? MCV 94.2 10/12/2021  ? PLT 254 10/12/2021  ? ?No results found for: IRON, TIBC, FERRITIN ? ?Attestation Statements:  ? ?Reviewed by clinician on day of visit: allergies, medications, problem list, medical history, surgical history, family history, social history, and previous encounter notes. ? ?I, Lizbeth Bark, RMA, am acting as transcriptionist for [employer], DO. ? ?I have reviewed the above documentation for accuracy and completeness, and I agree with the above. Jearld Lesch, DO ? ?

## 2021-11-10 ENCOUNTER — Encounter (INDEPENDENT_AMBULATORY_CARE_PROVIDER_SITE_OTHER): Payer: Self-pay | Admitting: Bariatrics

## 2021-11-10 LAB — VITAMIN D 25 HYDROXY (VIT D DEFICIENCY, FRACTURES): Vit D, 25-Hydroxy: 81 ng/mL (ref 30.0–100.0)

## 2021-11-10 LAB — LIPID PANEL WITH LDL/HDL RATIO
Cholesterol, Total: 219 mg/dL — ABNORMAL HIGH (ref 100–199)
HDL: 77 mg/dL (ref >39)
LDL Chol Calc (NIH): 121 mg/dL — ABNORMAL HIGH (ref 0–99)
LDL/HDL Ratio: 1.6 ratio (ref 0.0–3.2)
Triglycerides: 124 mg/dL (ref 0–149)
VLDL Cholesterol Cal: 21 mg/dL (ref 5–40)

## 2021-11-10 LAB — TSH+T4F+T3FREE
Free T4: 1.25 ng/dL (ref 0.82–1.77)
T3, Free: 2.9 pg/mL (ref 2.0–4.4)
TSH: 1.29 u[IU]/mL (ref 0.450–4.500)

## 2021-11-10 LAB — HEMOGLOBIN A1C
Est. average glucose Bld gHb Est-mCnc: 111 mg/dL
Hgb A1c MFr Bld: 5.5 % (ref 4.8–5.6)

## 2021-11-10 LAB — INSULIN, RANDOM: INSULIN: 6.4 u[IU]/mL (ref 2.6–24.9)

## 2021-11-12 ENCOUNTER — Other Ambulatory Visit (INDEPENDENT_AMBULATORY_CARE_PROVIDER_SITE_OTHER): Payer: Self-pay | Admitting: Bariatrics

## 2021-11-12 ENCOUNTER — Encounter (INDEPENDENT_AMBULATORY_CARE_PROVIDER_SITE_OTHER): Payer: Self-pay | Admitting: Bariatrics

## 2021-11-12 DIAGNOSIS — F5089 Other specified eating disorder: Secondary | ICD-10-CM

## 2021-11-12 MED ORDER — BUPROPION HCL ER (SR) 200 MG PO TB12: 200.0000 mg | ORAL_TABLET | Freq: Every day | ORAL | 0 refills | Status: DC

## 2021-11-12 NOTE — Telephone Encounter (Signed)
Dr.Brown 

## 2021-11-12 NOTE — Telephone Encounter (Signed)
Please review

## 2021-12-08 ENCOUNTER — Ambulatory Visit (INDEPENDENT_AMBULATORY_CARE_PROVIDER_SITE_OTHER): Payer: BC Managed Care – PPO | Admitting: Family Medicine

## 2021-12-08 ENCOUNTER — Encounter (INDEPENDENT_AMBULATORY_CARE_PROVIDER_SITE_OTHER): Payer: Self-pay | Admitting: Family Medicine

## 2021-12-08 VITALS — BP 132/75 | HR 66 | Temp 98.0°F | Ht 63.0 in | Wt 175.0 lb

## 2021-12-08 DIAGNOSIS — E559 Vitamin D deficiency, unspecified: Secondary | ICD-10-CM | POA: Diagnosis not present

## 2021-12-08 DIAGNOSIS — E7849 Other hyperlipidemia: Secondary | ICD-10-CM | POA: Diagnosis not present

## 2021-12-08 DIAGNOSIS — Z6831 Body mass index (BMI) 31.0-31.9, adult: Secondary | ICD-10-CM | POA: Diagnosis not present

## 2021-12-08 DIAGNOSIS — E669 Obesity, unspecified: Secondary | ICD-10-CM | POA: Diagnosis not present

## 2021-12-23 NOTE — Progress Notes (Signed)
Chief Complaint:   OBESITY Heather Hayes is here to discuss her progress with her obesity treatment plan along with follow-up of her obesity related diagnoses. Heather Hayes is on the Category 2 Plan or keeping a food journal and adhering to recommended goals of 1200 calories and 80 grams of protein daily and states she is following her eating plan approximately 60% of the time. Heather Hayes states she is walking the dog and gardening for 15 minutes 7 times per week.  Today's visit was #: 71 Starting weight: 191 lbs Starting date: 06/01/2019 Today's weight: 175 lbs Today's date: 12/08/2021 Total lbs lost to date: 16 Total lbs lost since last in-office visit: 0  Interim History: Heather Hayes has done more traveling and celebration eating. She will have some changes in her routine in the next 2 weeks.   Subjective:   1. Vitamin D deficiency Heather Hayes's Vitamin D level is at goal. She is on OTC Vitamin D 5,000 IU daily, with no side effects. I discussed labs with the patient today.  2. Other hyperlipidemia Heather Hayes LDL is elevated, and she is not on statin. She is working on her diet and weight loss. She denies chest pain or myalgias. I discussed labs with the patient today.  Assessment/Plan:   1. Vitamin D deficiency Sydell will continue her Vitamin D OTC, and we will recheck labs in 3 months.   2. Other hyperlipidemia Heather Hayes will continue her diet and exercise, and she may need to consider a statin if no improvement.   3. Obesity, Current BMI 31.1 Heather Hayes is currently in the action stage of change. As such, her goal is to continue with weight loss efforts. She has agreed to the Category 2 Plan.   Exercise goals: As is.   Behavioral modification strategies: increasing lean protein intake, meal planning and cooking strategies, dealing with family or coworker strategies, and celebration eating strategies.  Heather Hayes has agreed to follow-up with our clinic in 4 weeks. She was informed of the importance of frequent follow-up  visits to maximize her success with intensive lifestyle modifications for her multiple health conditions.   Objective:   Blood pressure 132/75, pulse 66, temperature 98 F (36.7 C), height '5\' 3"'$  (1.6 m), weight 175 lb (79.4 kg), SpO2 98 %. Body mass index is 31 kg/m.  General: Cooperative, alert, well developed, in no acute distress. HEENT: Conjunctivae and lids unremarkable. Cardiovascular: Regular rhythm.  Lungs: Normal work of breathing. Neurologic: No focal deficits.   Lab Results  Component Value Date   CREATININE 0.69 10/12/2021   BUN 21 (H) 10/12/2021   NA 140 10/12/2021   K 3.7 10/12/2021   CL 104 10/12/2021   CO2 30 10/12/2021   Lab Results  Component Value Date   ALT 13 10/12/2021   AST 18 10/12/2021   ALKPHOS 82 10/12/2021   BILITOT 0.5 10/12/2021   Lab Results  Component Value Date   HGBA1C 5.5 11/09/2021   HGBA1C 5.5 05/26/2021   HGBA1C 5.6 09/29/2020   HGBA1C 5.5 01/22/2020   HGBA1C 5.5 09/05/2019   Lab Results  Component Value Date   INSULIN 6.4 11/09/2021   INSULIN 6.7 05/26/2021   INSULIN 6.5 09/29/2020   INSULIN 8.0 05/20/2020   INSULIN 7.5 01/22/2020   Lab Results  Component Value Date   TSH 1.290 11/09/2021   Lab Results  Component Value Date   CHOL 219 (H) 11/09/2021   HDL 77 11/09/2021   LDLCALC 121 (H) 11/09/2021   TRIG 124 11/09/2021  CHOLHDL 2.8 05/20/2020   Lab Results  Component Value Date   VD25OH 81.0 11/09/2021   VD25OH 86.4 05/26/2021   VD25OH 77.6 09/29/2020   Lab Results  Component Value Date   WBC 6.8 10/12/2021   HGB 13.2 10/12/2021   HCT 40.6 10/12/2021   MCV 94.2 10/12/2021   PLT 254 10/12/2021   No results found for: IRON, TIBC, FERRITIN  Attestation Statements:   Reviewed by clinician on day of visit: allergies, medications, problem list, medical history, surgical history, family history, social history, and previous encounter notes.  Time spent on visit including pre-visit chart review and  post-visit care and charting was 30 minutes.   I, Trixie Dredge, am acting as transcriptionist for Dennard Nip, MD.  I have reviewed the above documentation for accuracy and completeness, and I agree with the above. -  Dennard Nip, MD

## 2022-01-05 ENCOUNTER — Ambulatory Visit (INDEPENDENT_AMBULATORY_CARE_PROVIDER_SITE_OTHER): Payer: BC Managed Care – PPO | Admitting: Family Medicine

## 2022-01-11 IMAGING — MG DIGITAL DIAGNOSTIC BILAT W/ TOMO W/ CAD
6 of 9 series · 6 of 25 positions shown · non-contrast
Comparison: Previous exam(s).

CLINICAL DATA: Malignant lumpectomy of the UPPER RIGHT breast in
October 2018 with adjuvant radiation therapy. Annual evaluation.

EXAM:
DIGITAL DIAGNOSTIC BILATERAL MAMMOGRAM WITH TOMOSYNTHESIS AND CAD
TECHNIQUE: Bilateral digital diagnostic mammography and breast tomosynthesis
was performed. The images were evaluated with computer-aided
detection.

[R MLO]
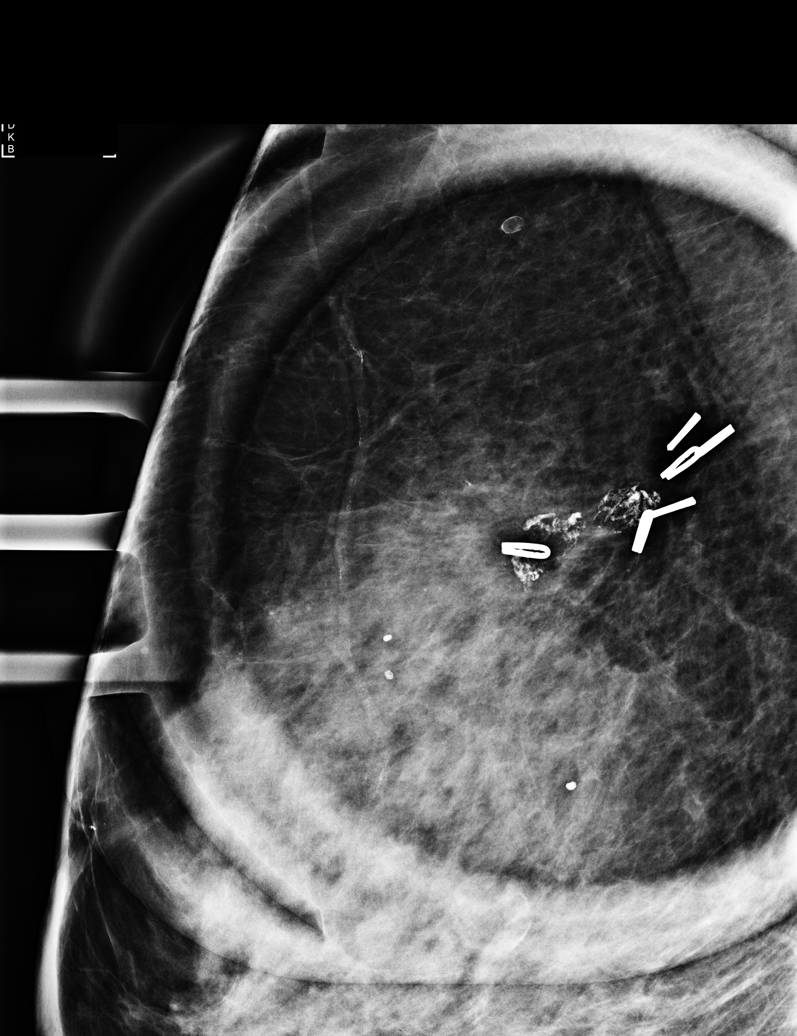

[R CC synth-2D]
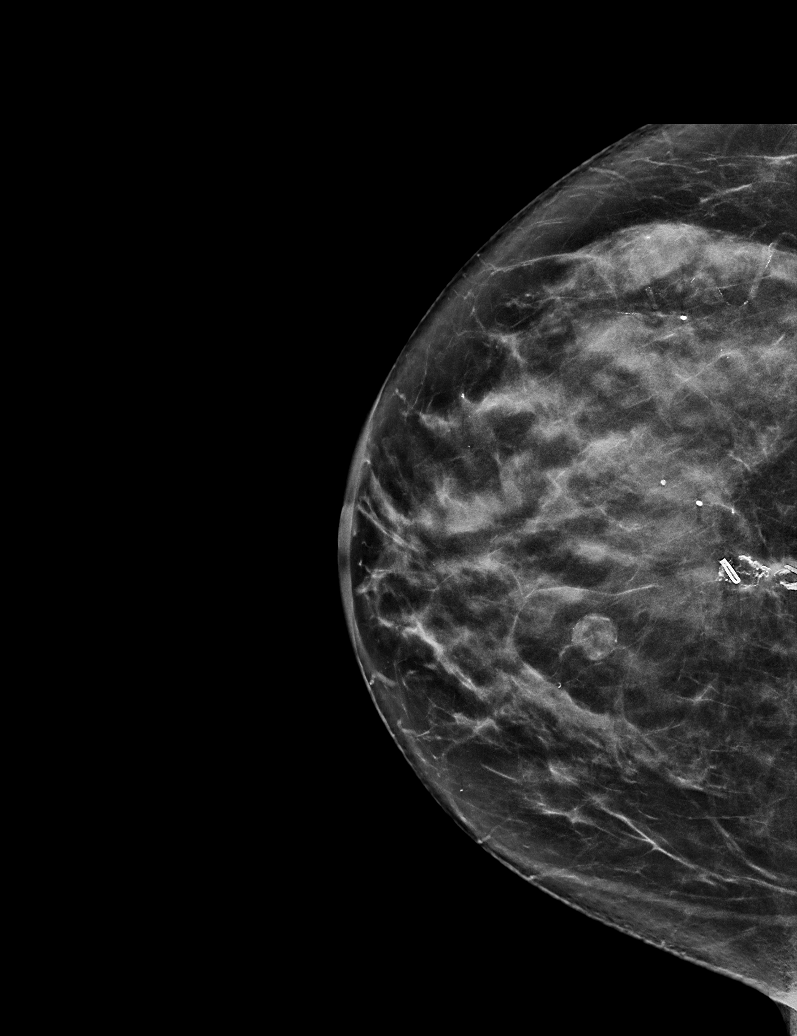

[R MLO synth-2D]
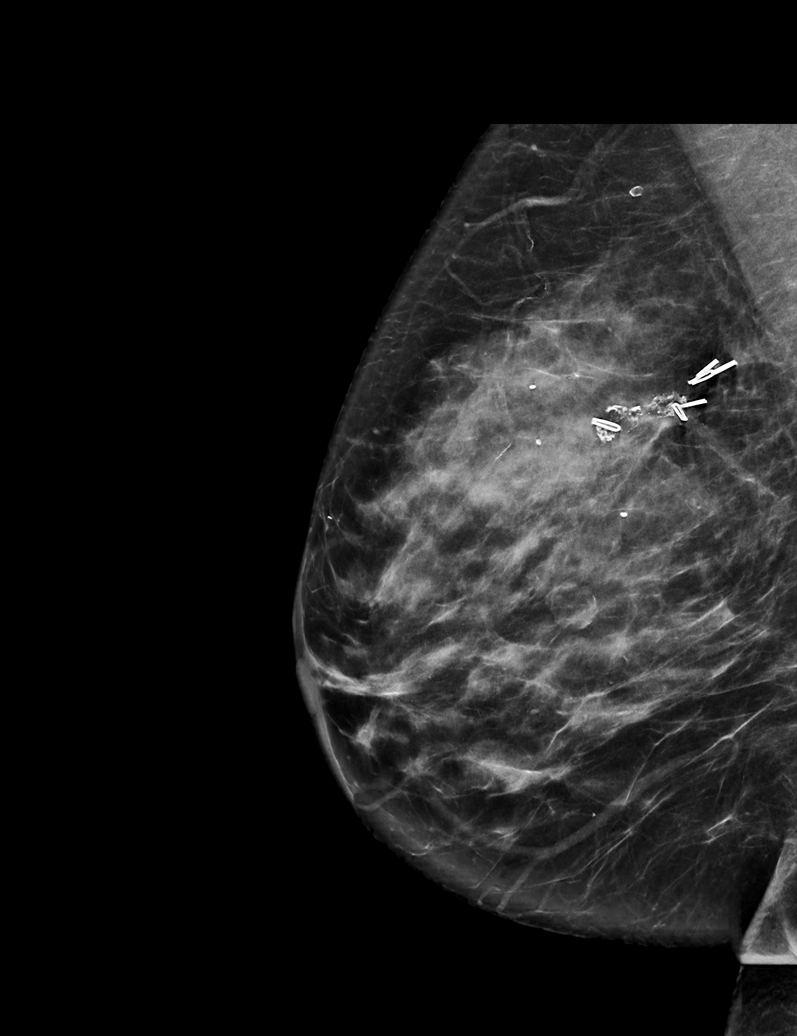

[L CC synth-2D]
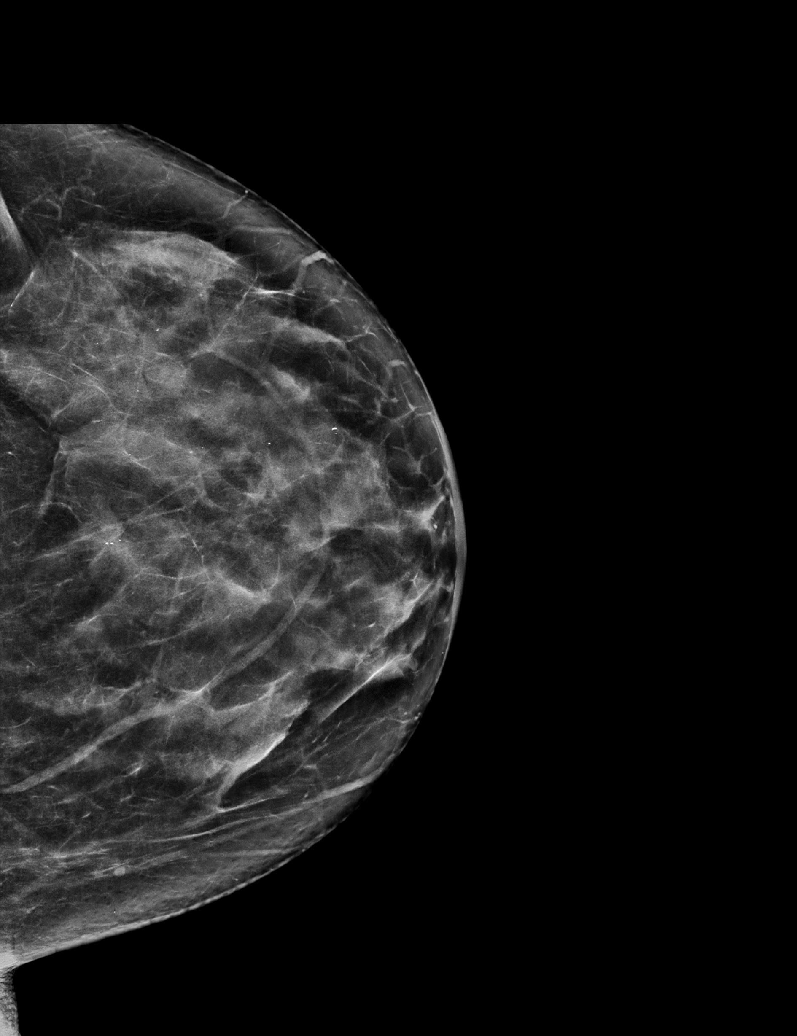

[L MLO synth-2D]
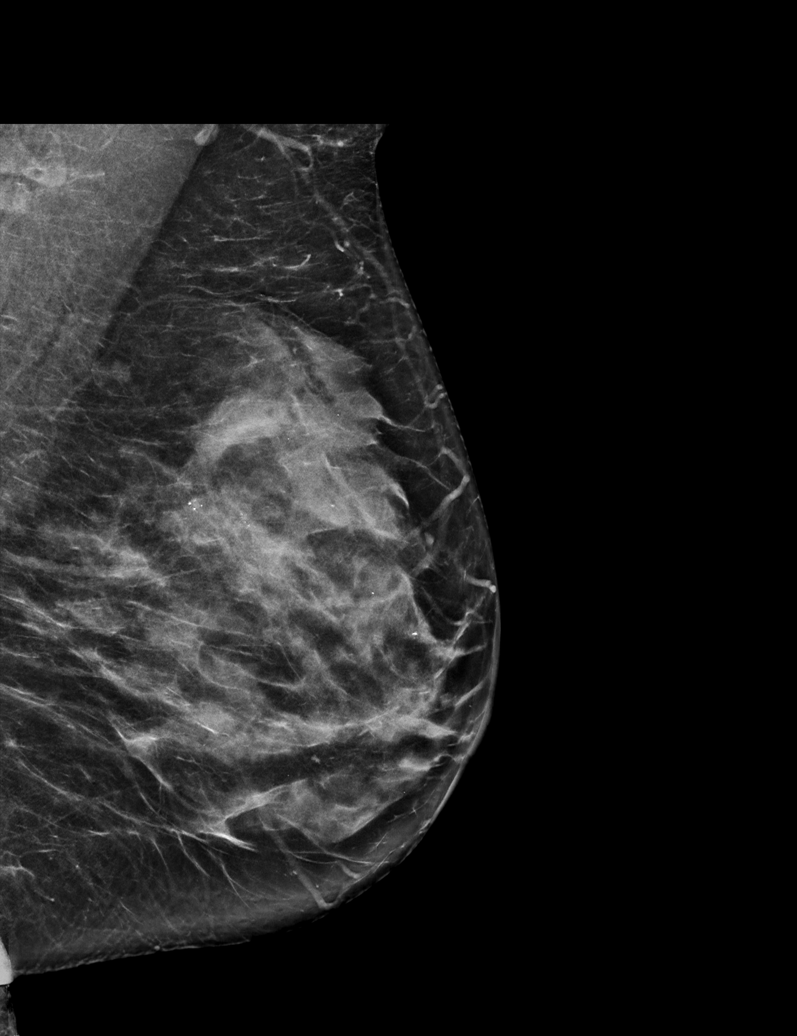

[R CC tomo · tomo slice 33/66.0]
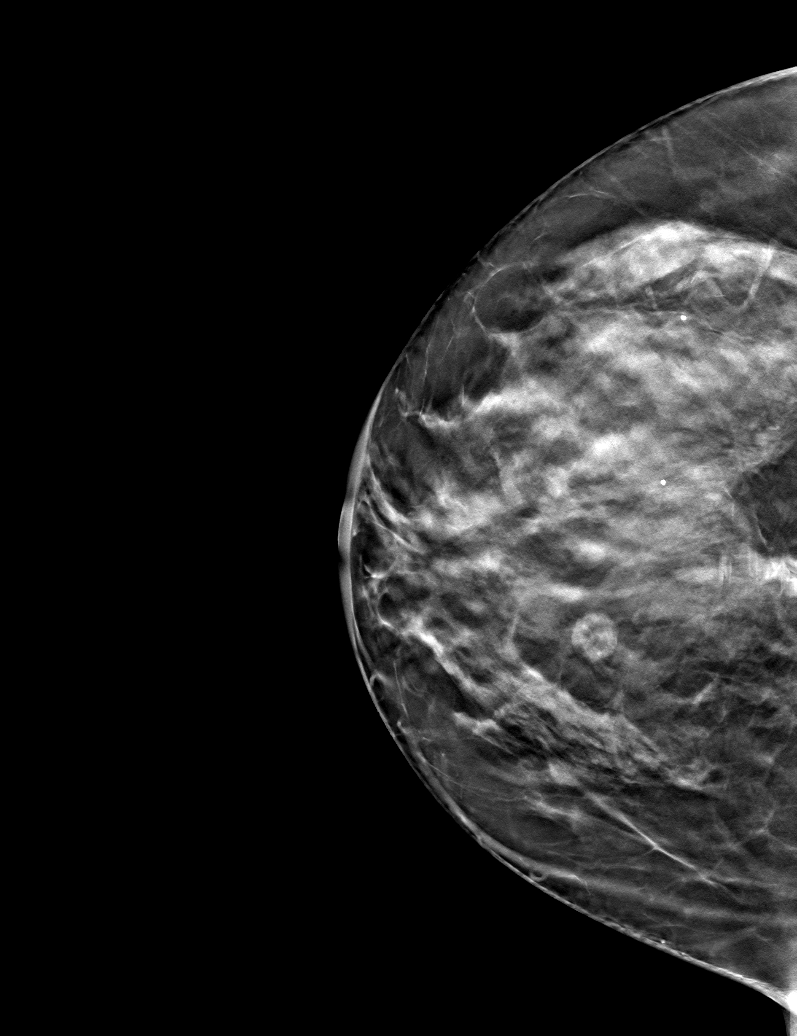

[6 of 25 positions shown; findings below may reference images not displayed]

ACR Breast Density Category d: The breast tissue is extremely dense,
which lowers the sensitivity of mammography.
FINDINGS: RIGHT: Post surgical scar/architectural distortion at the lumpectomy
site in the UPPER breast at POSTERIOR depth, with benign fat
necrosis/oil cysts and associated dystrophic calcifications at the
lumpectomy site. No new or suspicious findings.

LEFT: No findings suspicious for malignancy.
IMPRESSION: 1. No mammographic evidence of malignancy involving either breast.
2. Expected post lumpectomy changes involving the RIGHT breast.

RECOMMENDATION:
Per protocol, as the patient is now 2 or more years status post
lumpectomy, she may return to annual screening mammography in 1
year. However, given the history of breast cancer, the patient
remains eligible for annual diagnostic mammography if preferred.

I have discussed the findings and recommendations with the patient.
If applicable, a reminder letter will be sent to the patient
regarding the next appointment.

BI-RADS CATEGORY  2: Benign.

## 2022-01-26 ENCOUNTER — Encounter (INDEPENDENT_AMBULATORY_CARE_PROVIDER_SITE_OTHER): Payer: Self-pay | Admitting: Bariatrics

## 2022-01-26 ENCOUNTER — Ambulatory Visit (INDEPENDENT_AMBULATORY_CARE_PROVIDER_SITE_OTHER): Payer: BC Managed Care – PPO | Admitting: Bariatrics

## 2022-01-26 VITALS — BP 143/8 | HR 70 | Temp 97.8°F | Ht 63.0 in | Wt 178.0 lb

## 2022-01-26 DIAGNOSIS — E782 Mixed hyperlipidemia: Secondary | ICD-10-CM

## 2022-01-26 DIAGNOSIS — F5089 Other specified eating disorder: Secondary | ICD-10-CM

## 2022-01-26 DIAGNOSIS — Z6831 Body mass index (BMI) 31.0-31.9, adult: Secondary | ICD-10-CM

## 2022-01-26 DIAGNOSIS — E669 Obesity, unspecified: Secondary | ICD-10-CM | POA: Diagnosis not present

## 2022-01-26 MED ORDER — ESCITALOPRAM OXALATE 10 MG PO TABS: 10.0000 mg | ORAL_TABLET | Freq: Every day | ORAL | 0 refills | Status: DC

## 2022-01-27 NOTE — Progress Notes (Unsigned)
Chief Complaint:   OBESITY Heather Hayes is here to discuss her progress with her obesity treatment plan along with follow-up of her obesity related diagnoses. Heather Hayes is on the Category 2 Plan and states she is following her eating plan approximately 70% of the time. Heather Hayes states she is doing garden work, and walking on the treadmill for 15 minutes 7 times per week.  Today's visit was #: 43 Starting weight: 191 lbs Starting date: 06/01/2019 Today's weight: 178 lbs Today's date: 01/26/2022 Total lbs lost to date: 13 Total lbs lost since last in-office visit: 0  Interim History: Heather Hayes is up 3 lbs since her last visit. She feels like she is gaining weight since being on Wellbutrin. Bioimpedance scale shows increased muscle mass.  Subjective:   1. Mixed hyperlipidemia Heather Hayes is not on medications currently.   2. Other disorder of eating/emotional eating Heather Hayes denies suicidal ideations.   Assessment/Plan:   1. Mixed hyperlipidemia Heather Hayes will eliminate trans fats, and keep her saturated fats low.   2. Other disorder of eating/emotional eating Heather Hayes will continue Lexapro 10 mg daily, and we will refill for 90 days.  - escitalopram (LEXAPRO) 10 MG tablet; Take 1 tablet (10 mg total) by mouth daily.  Dispense: 90 tablet; Refill: 0  3. Obesity, Current BMI 31.5 Heather Hayes is currently in the action stage of change. As such, her goal is to continue with weight loss efforts. She has agreed to the Category 2 Plan or keeping a food journal and adhering to recommended goals of 1200 calories and 80 grams of protein daily.   Meal planning and intentional eating were discussed.  We discussed healthy snacks.  Exercise goals: As is.   Behavioral modification strategies: increasing lean protein intake, decreasing simple carbohydrates, increasing vegetables, increasing water intake, decreasing eating out, no skipping meals, meal planning and cooking strategies, keeping healthy foods in the home, and planning for  success.  Heather Hayes has agreed to follow-up with our clinic in 3 to 4 weeks. She was informed of the importance of frequent follow-up visits to maximize her success with intensive lifestyle modifications for her multiple health conditions.   Objective:   Blood pressure (!) 143/8, pulse 70, temperature 97.8 F (36.6 C), height '5\' 3"'$  (1.6 m), weight 178 lb (80.7 kg), SpO2 98 %. Body mass index is 31.53 kg/m.  General: Cooperative, alert, well developed, in no acute distress. HEENT: Conjunctivae and lids unremarkable. Cardiovascular: Regular rhythm.  Lungs: Normal work of breathing. Neurologic: No focal deficits.   Lab Results  Component Value Date   CREATININE 0.69 10/12/2021   BUN 21 (H) 10/12/2021   NA 140 10/12/2021   K 3.7 10/12/2021   CL 104 10/12/2021   CO2 30 10/12/2021   Lab Results  Component Value Date   ALT 13 10/12/2021   AST 18 10/12/2021   ALKPHOS 82 10/12/2021   BILITOT 0.5 10/12/2021   Lab Results  Component Value Date   HGBA1C 5.5 11/09/2021   HGBA1C 5.5 05/26/2021   HGBA1C 5.6 09/29/2020   HGBA1C 5.5 01/22/2020   HGBA1C 5.5 09/05/2019   Lab Results  Component Value Date   INSULIN 6.4 11/09/2021   INSULIN 6.7 05/26/2021   INSULIN 6.5 09/29/2020   INSULIN 8.0 05/20/2020   INSULIN 7.5 01/22/2020   Lab Results  Component Value Date   TSH 1.290 11/09/2021   Lab Results  Component Value Date   CHOL 219 (H) 11/09/2021   HDL 77 11/09/2021   LDLCALC 121 (H) 11/09/2021  TRIG 124 11/09/2021   CHOLHDL 2.8 05/20/2020   Lab Results  Component Value Date   VD25OH 81.0 11/09/2021   VD25OH 86.4 05/26/2021   VD25OH 77.6 09/29/2020   Lab Results  Component Value Date   WBC 6.8 10/12/2021   HGB 13.2 10/12/2021   HCT 40.6 10/12/2021   MCV 94.2 10/12/2021   PLT 254 10/12/2021   No results found for: "IRON", "TIBC", "FERRITIN"  Attestation Statements:   Reviewed by clinician on day of visit: allergies, medications, problem list, medical history,  surgical history, family history, social history, and previous encounter notes.   Wilhemena Durie, am acting as Location manager for [employer], DO.  I have reviewed the above documentation for accuracy and completeness, and I agree with the above. Jearld Lesch, DO

## 2022-03-01 ENCOUNTER — Ambulatory Visit (INDEPENDENT_AMBULATORY_CARE_PROVIDER_SITE_OTHER): Payer: BC Managed Care – PPO | Admitting: Bariatrics

## 2022-03-01 ENCOUNTER — Telehealth (INDEPENDENT_AMBULATORY_CARE_PROVIDER_SITE_OTHER): Payer: BC Managed Care – PPO | Admitting: Family Medicine

## 2022-03-01 ENCOUNTER — Encounter (INDEPENDENT_AMBULATORY_CARE_PROVIDER_SITE_OTHER): Payer: Self-pay

## 2022-03-02 NOTE — Progress Notes (Signed)
TeleHealth Visit:  This visit was completed with telemedicine (audio/video) technology. Heather Hayes has verbally consented to this TeleHealth visit. The patient is located at home, the provider is located at home. The participants in this visit include the listed provider and patient. The visit was conducted today via MyChart video.  OBESITY [name] is here to discuss her progress with her obesity treatment plan along with follow-up of her obesity related diagnoses.   Today's visit was # 60 Starting weight: 191 lbs Starting date: 06/01/2019 Weight at last in office visit: 178 lbs on 01/26/22 Total weight loss: 13 lbs at last in office visit on 01/26/22. Today's reported weight: 178 lbs   Nutrition Plan: the Category 2 Plan and keeping a food journal and adhering to recommended goals of 1200 calories and 80 gms protein.  Hunger is well controlled.  Current exercise: 5000 steps daily  Interim History: Heather Hayes started journaling about 4 to 5 days ago because she wanted to get back on track.  She is also following the category 2 plan.  She has been meeting her calorie and protein goals since she began journaling. Her low weight was 164 pounds back in September 2021.  Since then her weight has drifted back up.  She reports a weight of 178 pounds today. Her weight goal is 165 pounds (29 BMI).  Assessment/Plan:  1.  Snoring She reports that her husband says she is a very loud snorer.  She also notes she can fall asleep very easily during the day and her Apple watch tells her she does not get a good amount of quality sleep.  Plan: Refer to pulmonology for evaluation.  2. Insulin Resistance Last fasting insulin was slightly elevated at 6.4.  Fasting insulin was 14.9 when she first started our program. She  denies polyphagia. Medication(s): None Lab Results  Component Value Date   HGBA1C 5.5 11/09/2021   Lab Results  Component Value Date   INSULIN 6.4 11/09/2021   INSULIN 6.7 05/26/2021    INSULIN 6.5 09/29/2020   INSULIN 8.0 05/20/2020   INSULIN 7.5 01/22/2020    Plan Continue to eat adequate protein and avoid simple carbohydrates.  3. Obesity: Current BMI 31.5 [name] is currently in the action stage of change. As such, her goal is to continue with weight loss efforts.  She has agreed to Category 2 Plan and keeping a food journal and adhering to recommended goals of 1200 calories and 80 gms protein. Marland Kitchen   Exercise goals: She will try to walk for 30 minutes twice weekly.  Behavioral modification strategies: increasing lean protein intake, decreasing simple carbohydrates, and keeping a strict food journal.  [name] has agreed to follow-up with our clinic in 4 weeks.   Orders Placed This Encounter  Procedures   Ambulatory referral to Pulmonology    There are no discontinued medications.   No orders of the defined types were placed in this encounter.     Objective:   VITALS: Per patient if applicable, see vitals. GENERAL: Alert and in no acute distress. CARDIOPULMONARY: No increased WOB. Speaking in clear sentences.  PSYCH: Pleasant and cooperative. Speech normal rate and rhythm. Affect is appropriate. Insight and judgement are appropriate. Attention is focused, linear, and appropriate.  NEURO: Oriented as arrived to appointment on time with no prompting.   Lab Results  Component Value Date   CREATININE 0.69 10/12/2021   BUN 21 (H) 10/12/2021   NA 140 10/12/2021   K 3.7 10/12/2021   CL 104 10/12/2021  CO2 30 10/12/2021   Lab Results  Component Value Date   ALT 13 10/12/2021   AST 18 10/12/2021   ALKPHOS 82 10/12/2021   BILITOT 0.5 10/12/2021   Lab Results  Component Value Date   HGBA1C 5.5 11/09/2021   HGBA1C 5.5 05/26/2021   HGBA1C 5.6 09/29/2020   HGBA1C 5.5 01/22/2020   HGBA1C 5.5 09/05/2019   Lab Results  Component Value Date   INSULIN 6.4 11/09/2021   INSULIN 6.7 05/26/2021   INSULIN 6.5 09/29/2020   INSULIN 8.0 05/20/2020   INSULIN 7.5  01/22/2020   Lab Results  Component Value Date   TSH 1.290 11/09/2021   Lab Results  Component Value Date   CHOL 219 (H) 11/09/2021   HDL 77 11/09/2021   LDLCALC 121 (H) 11/09/2021   TRIG 124 11/09/2021   CHOLHDL 2.8 05/20/2020   Lab Results  Component Value Date   WBC 6.8 10/12/2021   HGB 13.2 10/12/2021   HCT 40.6 10/12/2021   MCV 94.2 10/12/2021   PLT 254 10/12/2021   No results found for: "IRON", "TIBC", "FERRITIN" Lab Results  Component Value Date   VD25OH 81.0 11/09/2021   VD25OH 86.4 05/26/2021   VD25OH 77.6 09/29/2020    Attestation Statements:   Reviewed by clinician on day of visit: allergies, medications, problem list, medical history, surgical history, family history, social history, and previous encounter notes.

## 2022-03-03 ENCOUNTER — Encounter (INDEPENDENT_AMBULATORY_CARE_PROVIDER_SITE_OTHER): Payer: Self-pay | Admitting: Family Medicine

## 2022-03-03 ENCOUNTER — Telehealth (INDEPENDENT_AMBULATORY_CARE_PROVIDER_SITE_OTHER): Payer: BC Managed Care – PPO | Admitting: Family Medicine

## 2022-03-03 DIAGNOSIS — Z6831 Body mass index (BMI) 31.0-31.9, adult: Secondary | ICD-10-CM

## 2022-03-03 DIAGNOSIS — E88819 Insulin resistance, unspecified: Secondary | ICD-10-CM

## 2022-03-03 DIAGNOSIS — E8881 Metabolic syndrome: Secondary | ICD-10-CM

## 2022-03-03 DIAGNOSIS — E669 Obesity, unspecified: Secondary | ICD-10-CM

## 2022-03-03 DIAGNOSIS — R0683 Snoring: Secondary | ICD-10-CM | POA: Diagnosis not present

## 2022-03-10 ENCOUNTER — Encounter (INDEPENDENT_AMBULATORY_CARE_PROVIDER_SITE_OTHER): Payer: Self-pay

## 2022-03-31 ENCOUNTER — Ambulatory Visit (INDEPENDENT_AMBULATORY_CARE_PROVIDER_SITE_OTHER): Payer: BC Managed Care – PPO | Admitting: Bariatrics

## 2022-03-31 ENCOUNTER — Encounter (INDEPENDENT_AMBULATORY_CARE_PROVIDER_SITE_OTHER): Payer: Self-pay | Admitting: Bariatrics

## 2022-03-31 VITALS — BP 130/83 | HR 61 | Temp 98.0°F | Ht 63.0 in | Wt 181.0 lb

## 2022-03-31 DIAGNOSIS — E782 Mixed hyperlipidemia: Secondary | ICD-10-CM

## 2022-03-31 DIAGNOSIS — Z6832 Body mass index (BMI) 32.0-32.9, adult: Secondary | ICD-10-CM

## 2022-03-31 DIAGNOSIS — E8881 Metabolic syndrome: Secondary | ICD-10-CM | POA: Diagnosis not present

## 2022-03-31 DIAGNOSIS — E669 Obesity, unspecified: Secondary | ICD-10-CM

## 2022-04-12 NOTE — Progress Notes (Unsigned)
Chief Complaint:   OBESITY Heather Hayes is here to discuss her progress with her obesity treatment plan along with follow-up of her obesity related diagnoses. Heather Hayes is on the Category 2 Plan and keeping a food journal and adhering to recommended goals of 1200 calories and 80 grams of protein daily and states she is following her eating plan approximately 60-65% of the time. [name] states she is walking 5,000 steps daily.    Today's visit was #: 35 Starting weight: 191 lbs Starting date: 06/01/2019 Today's weight: 181 lbs Today's date: 03/31/2022 Total lbs lost to date: 10 Total lbs lost since last in-office visit: 0  Interim History: [name] is up 3 lbs since her last visit, but she has done fairly well overall. She went to the beach for 1 week. She is up about 1 1/2 lbs of water weight.   Subjective:   1. Mixed hyperlipidemia Heather Hayes is not on medications currently.   2. Insulin resistance Heather Hayes is not on medications currently.   Assessment/Plan:   1. Mixed hyperlipidemia Heather Hayes will eliminate trans fats, and increase PUFA's and MUFA's.   2. Insulin resistance Heather Hayes will keep her starches and sugars low.   3. Obesity, Current BMI 32.1 Heather Hayes is currently in the action stage of change. As such, her goal is to continue with weight loss efforts. She has agreed to the Category 2 Plan.   Mindful eating and intentional eating were discussed.  Patient was referred to [facility] pulmonology for a sleep test.  She will journal while intermittently fasting.  Exercise goals: As is.   Behavioral modification strategies: increasing lean protein intake, decreasing simple carbohydrates, increasing vegetables, increasing water intake, decreasing eating out, no skipping meals, meal planning and cooking strategies, keeping healthy foods in the home, and planning for success.  [name] has agreed to follow-up with our clinic in 4 weeks with [name], NP, and in 8 weeks with [name]. She was informed of the  importance of frequent follow-up visits to maximize her success with intensive lifestyle modifications for her multiple health conditions.   Objective:   Blood pressure 130/83, pulse 61, temperature 98 F (36.7 C), height '5\' 3"'$  (1.6 m), weight 181 lb (82.1 kg), SpO2 97 %. Body mass index is 32.06 kg/m.  General: Cooperative, alert, well developed, in no acute distress. HEENT: Conjunctivae and lids unremarkable. Cardiovascular: Regular rhythm.  Lungs: Normal work of breathing. Neurologic: No focal deficits.   Lab Results  Component Value Date   CREATININE 0.69 10/12/2021   BUN 21 (H) 10/12/2021   NA 140 10/12/2021   K 3.7 10/12/2021   CL 104 10/12/2021   CO2 30 10/12/2021   Lab Results  Component Value Date   ALT 13 10/12/2021   AST 18 10/12/2021   ALKPHOS 82 10/12/2021   BILITOT 0.5 10/12/2021   Lab Results  Component Value Date   HGBA1C 5.5 11/09/2021   HGBA1C 5.5 05/26/2021   HGBA1C 5.6 09/29/2020   HGBA1C 5.5 01/22/2020   HGBA1C 5.5 09/05/2019   Lab Results  Component Value Date   INSULIN 6.4 11/09/2021   INSULIN 6.7 05/26/2021   INSULIN 6.5 09/29/2020   INSULIN 8.0 05/20/2020   INSULIN 7.5 01/22/2020   Lab Results  Component Value Date   TSH 1.290 11/09/2021   Lab Results  Component Value Date   CHOL 219 (H) 11/09/2021   HDL 77 11/09/2021   LDLCALC 121 (H) 11/09/2021   TRIG 124 11/09/2021   CHOLHDL 2.8 05/20/2020  Lab Results  Component Value Date   VD25OH 81.0 11/09/2021   VD25OH 86.4 05/26/2021   VD25OH 77.6 09/29/2020   Lab Results  Component Value Date   WBC 6.8 10/12/2021   HGB 13.2 10/12/2021   HCT 40.6 10/12/2021   MCV 94.2 10/12/2021   PLT 254 10/12/2021   No results found for: "IRON", "TIBC", "FERRITIN"  Attestation Statements:   Reviewed by clinician on day of visit: allergies, medications, problem list, medical history, surgical history, family history, social history, and previous encounter notes.   Wilhemena Durie, am  acting as Location manager for [employer], DO.  I have reviewed the above documentation for accuracy and completeness, and I agree with the above. Jearld Lesch, DO

## 2022-04-13 ENCOUNTER — Encounter: Payer: Self-pay | Admitting: Pulmonary Disease

## 2022-04-13 ENCOUNTER — Ambulatory Visit (INDEPENDENT_AMBULATORY_CARE_PROVIDER_SITE_OTHER): Payer: BC Managed Care – PPO | Admitting: Pulmonary Disease

## 2022-04-13 VITALS — BP 132/80 | HR 71 | Ht 63.0 in | Wt 189.2 lb

## 2022-04-13 DIAGNOSIS — Z23 Encounter for immunization: Secondary | ICD-10-CM | POA: Diagnosis not present

## 2022-04-13 DIAGNOSIS — R0683 Snoring: Secondary | ICD-10-CM | POA: Diagnosis not present

## 2022-04-13 NOTE — Progress Notes (Signed)
[facility]  Chief Complaint  Patient presents with   Consult    Loud snoring    Past Surgical History:  She  has a past surgical history that includes Tubal ligation; tube ligation ; Cervical spine surgery (2009); Breast lumpectomy with radioactive seed and sentinel lymph node biopsy (Right, 10/27/2018); Colonoscopy; Polypectomy; Endometrial ablation; and Breast lumpectomy (Right, 10/27/2018).  Past Medical History:  Anxiety, Rt breast cancer, GERD, HLD  Constitutional:  BP 132/80 (BP Location: Left Arm, Cuff Size: Normal)   Pulse 71   Ht '5\' 3"'$  (1.6 m)   Wt 189 lb 3.2 oz (85.8 kg)   SpO2 95%   BMI 33.52 kg/m   Brief Summary:  Heather Hayes is a 61 y.o. female with snoring.      Subjective:   Her family has been worried about her snoring for years.  She stops breathing while asleep.  Her sister and daughter both use CPAP.  She is convinced her father had sleep apnea also.  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  She falls asleep after dinner or while waiting for her husband to return from work.  She goes to sleep between 930 and 1030 pm.  She falls asleep in few minutes.  She wakes up some times to use the bathroom.  She gets out of bed between 515 and 525 am.  She feels tired in the morning.  She sometimes gets morning headaches.  She does not use anything to help her fall sleep.  She drinks coffee in the morning and later in the day.  She denies sleep walking, sleep talking, bruxism, or nightmares.  There is no history of restless legs.  She denies sleep hallucinations, sleep paralysis, or cataplexy.  The Epworth score is 6 out of 24.   Physical Exam:   Appearance - well kempt   ENMT - no sinus tenderness, no oral exudate, no LAN, Mallampati 4 airway, no stridor  Respiratory - equal breath sounds bilaterally, no wheezing or rales  CV - s1s2 regular rate and rhythm, no murmurs  Ext - no  clubbing, no edema  Skin - no rashes  Psych - normal mood and affect   Sleep Tests:    Cardiac Tests:    Social History:  She  reports that she has never smoked. She has never used smokeless tobacco. She reports current alcohol use of about 1.0 - 2.0 standard drink of alcohol per week. She reports that she does not use drugs.  Family History:  Her family history includes Breast cancer (age of onset: 77) in her mother; Diabetes in her father and mother; Hyperlipidemia in her father and mother; Hypertension in her father and mother; Obesity in her mother; Ovarian cancer in her maternal grandmother.    Discussion:  She has snoring, sleep disruption, apnea, and daytime sleepiness.  She has history of anxiety and family history of sleep apnea.  She has noticed difficulty with her memory.  I am concerned she could have obstructive sleep apnea.  Assessment/Plan:   Snoring with excessive daytime sleepiness. - will need to arrange for a home sleep study  Obesity. - discussed how weight can impact sleep and risk for sleep disordered breathing - discussed options to assist with weight loss: combination of diet modification, cardiovascular and strength training exercises  Cardiovascular risk. - had an extensive discussion regarding the adverse health consequences related to untreated sleep disordered breathing - specifically  discussed the risks for hypertension, coronary artery disease, cardiac dysrhythmias, cerebrovascular disease, and diabetes - lifestyle modification discussed  Safe driving practices. - discussed how sleep disruption can increase risk of accidents, particularly when driving - safe driving practices were discussed  Therapies for obstructive sleep apnea. - if the sleep study shows significant sleep apnea, then various therapies for treatment were reviewed: CPAP, oral appliance, and surgical interventions  Time Spent Involved in Patient Care on Day of Examination:  35  minutes  Follow up:   Patient Instructions  Will arrange for home sleep study Will call to arrange for follow up after sleep study reviewed   Medication List:   Allergies as of 04/13/2022       Reactions   Compazine [prochlorperazine Edisylate]    Muscle contractures        Medication List        Accurate as of April 13, 2022  3:37 PM. If you have any questions, ask your nurse or doctor.          clonazePAM 0.5 MG tablet Commonly known as: KLONOPIN Take 0.5 mg by mouth 2 (two) times daily as needed for anxiety.   escitalopram 10 MG tablet Commonly known as: Lexapro Take 1 tablet (10 mg total) by mouth daily.   exemestane 25 MG tablet Commonly known as: AROMASIN TAKE 1 TABLET (25 MG TOTAL) BY MOUTH DAILY AFTER BREAKFAST.   meloxicam 15 MG tablet Commonly known as: MOBIC Take 15 mg by mouth daily.   Vitamin D 125 MCG (5000 UT) Caps PO 5 days per week.        Signature:  Chesley Mires, MD [place] Pager - [phone] 04/13/2022, 3:37 PM

## 2022-04-13 NOTE — Patient Instructions (Signed)
Will arrange for home sleep study Will call to arrange for follow up after sleep study reviewed  

## 2022-04-14 ENCOUNTER — Encounter (INDEPENDENT_AMBULATORY_CARE_PROVIDER_SITE_OTHER): Payer: Self-pay | Admitting: Bariatrics

## 2022-04-26 ENCOUNTER — Other Ambulatory Visit: Payer: Self-pay

## 2022-04-26 DIAGNOSIS — C50411 Malignant neoplasm of upper-outer quadrant of right female breast: Secondary | ICD-10-CM

## 2022-04-26 NOTE — Progress Notes (Unsigned)
[place]   [phone] [phone]   Clinic Follow up Note   Patient Care Team: Molli Posey, MD as PCP - General (Obstetrics and Gynecology) Mauro Kaufmann, RN as Oncology Nurse Navigator Rockwell Germany, RN as Oncology Nurse Navigator Excell Seltzer, MD (Inactive) as Consulting Physician (General Surgery) Truitt Merle, MD as Consulting Physician (Hematology) Gery Pray, MD as Consulting Physician (Radiation Oncology) Alla Feeling, NP as Nurse Practitioner (Nurse Practitioner) 04/27/2022  CHIEF COMPLAINT: Follow-up right breast cancer  SUMMARY OF ONCOLOGIC HISTORY: Oncology History Overview Note  Cancer Staging Malignant neoplasm of upper-outer quadrant of right breast in female, estrogen receptor positive ([place]) Staging form: Breast, AJCC 8th Edition - Clinical stage from 10/02/2018: Stage IA (cT1b, cN0, cM0, G2, ER+, PR+, HER2-) - Signed by Truitt Merle, MD on 10/10/2018 - Pathologic stage from 10/27/2018: Stage IA (pT1b, pN0, cM0, G1, ER+, PR+, HER2-) - Signed by Truitt Merle, MD on 02/26/2019     Malignant neoplasm of upper-outer quadrant of right breast in female, estrogen receptor positive ([place])  09/27/2018 Mammogram   [facility] Mammogram 09/27/18  IMPRESSION: 1. Suspicious right breast mass measures 7 x 7 x 6 mm with associated vascularity at the 12 o'clock position 7 cm from the nipple. 2. No suspicious right axillary lymphadenopathy.   10/02/2018 Cancer Staging   Staging form: Breast, AJCC 8th Edition - Clinical stage from 10/02/2018: Stage IA (cT1b, cN0, cM0, G2, ER+, PR+, HER2-) - Signed by Truitt Merle, MD on 10/10/2018   10/02/2018 Initial Biopsy   Diagnosis 10/02/18 Breast, right, needle core biopsy, 12 o'clock - INVASIVE DUCTAL CARCINOMA.   10/02/2018 Receptors her2   Results: IMMUNOHISTOCHEMICAL AND MORPHOMETRIC ANALYSIS PERFORMED MANUALLY The tumor cells are NEGATIVE for Her2 (1+). Estrogen Receptor: 95%, POSITIVE, STRONG  STAINING INTENSITY Progesterone Receptor: 95%, POSITIVE, STRONG STAINING INTENSITY Proliferation Marker Ki67: 5%   10/04/2018 Initial Diagnosis   Malignant neoplasm of upper-outer quadrant of right breast in female, estrogen receptor positive ([place])   10/27/2018 Surgery   RIGHT BREAST LUMPECTOMY WITH RADIOACTIVE SEED AND SENTINEL LYMPH NODE BIOPSY by Dr Excell Seltzer   10/27/2018 Pathology Results   Diagnosis 10/27/18 1. Breast, lumpectomy, Right - INVASIVE DUCTAL CARCINOMA WITH CALCIFICATIONS, GRADE I/III, SPANNING 0.7 CM. - THE SURGICAL RESECTION MARGIN ARE NEGATIVE FOR CARCINOMA. - SEE ONCOLOGY TABLE BELOW. 2. Lymph node, sentinel, biopsy, Right - THERE IS NO EVIDENCE OF CARCINOMA IN 1 OF 1 LYMPH NODE (0/1). 3. Lymph node, sentinel, biopsy, Right - THERE IS NO EVIDENCE OF CARCINOMA IN 1 OF 1 LYMPH NODE (0/1).    - 01/29/2019 Radiation Therapy   Rad[facility] at [facility] on 01/29/19.    10/27/2018 Cancer Staging   Staging form: Breast, AJCC 8th Edition - Pathologic stage from 10/27/2018: Stage IA (pT1b, pN0, cM0, G1, ER+, PR+, HER2-) - Signed by Truitt Merle, MD on 02/26/2019   03/2019 -  Anti-estrogen oral therapy   Anastrozole 73m daily starting in 03/2019    07/09/2019 Survivorship   SCP delivered by LCira Rue NP      CURRENT THERAPY: Antiestrogen starting 03/2019, currently exemestane since 05/02/2021  INTERVAL HISTORY: Ms. BGosareturns for follow-up as scheduled, last seen by Dr. FBurr Medico3/13/2023.  Diagnostic mammogram 10/26/2021 showed breast density category C and the development of a benign calcification at the lumpectomy site, otherwise negative for malignancy.  She still feels it but has not changed. She continues exemestane. She has undesired weight gain despite adhering to plan established with healthy weight management NP.  She has a virtual appointment with her tomorrow.  She continues to get in 5-10000 steps a day.  She does have more work at stress, and occasionally  feels more anxious with some chest aches.  This is managed with low doses of Lexapro and Klonopin.  Her libido remains nonexistent but has good support from her husband.  She notes some foods get "hung" like beef stew, she is managing with smaller bites, adequate chewing, and drinking water with meals.  This happened to her father.  She needs to see her OB/GYN this year and schedule a sleep study which is pending.  Next calcium and vitamin D 3 times a week.  Review of systems was otherwise negative.   MEDICAL HISTORY:  Past Medical History:  Diagnosis Date   Anxiety    Arthritis    Back pain    Bursitis    Cancer ([place])    right breast cancer 09-2018   Constipation    GERD (gastroesophageal reflux disease)    some heart burn from Arimidex   History of radiation therapy    completed 01-29-2019   Hyperlipidemia    Joint pain    Personal history of radiation therapy    Rheumatic fever    Swallowing difficulty     SURGICAL HISTORY: Past Surgical History:  Procedure Laterality Date   BREAST LUMPECTOMY Right 10/27/2018   BREAST LUMPECTOMY WITH RADIOACTIVE SEED AND SENTINEL LYMPH NODE BIOPSY Right 10/27/2018   Procedure: RIGHT BREAST LUMPECTOMY WITH RADIOACTIVE SEED AND SENTINEL LYMPH NODE BIOPSY;  Surgeon: Excell Seltzer, MD;  Location: [place];  Service: General;  Laterality: Right;   CERVICAL SPINE SURGERY  2009   COLONOSCOPY     ENDOMETRIAL ABLATION     POLYPECTOMY     TUBAL LIGATION     tube ligation       I have reviewed the social history and family history with the patient and they are unchanged from previous note.  ALLERGIES:  is allergic to compazine [prochlorperazine edisylate].  MEDICATIONS:  Current Outpatient Medications  Medication Sig Dispense Refill   Cholecalciferol (VITAMIN D) 125 MCG (5000 UT) CAPS PO 5 days per week. 30 capsule 0   clonazePAM (KLONOPIN) 0.5 MG tablet Take 0.5 mg by mouth 2 (two) times daily as needed for anxiety.      escitalopram (LEXAPRO) 10 MG tablet Take 1 tablet (10 mg total) by mouth daily. 90 tablet 0   exemestane (AROMASIN) 25 MG tablet TAKE 1 TABLET (25 MG TOTAL) BY MOUTH DAILY AFTER BREAKFAST. 90 tablet 2   meloxicam (MOBIC) 15 MG tablet Take 15 mg by mouth daily.     No current facility-administered medications for this visit.    PHYSICAL EXAMINATION: ECOG PERFORMANCE STATUS: 1 - Symptomatic but completely ambulatory  Vitals:   04/27/22 0900  BP: (!) 156/84  Pulse: 61  Resp: 16  Temp: 98.3 F (36.8 C)  SpO2: 100%   Filed Weights   04/27/22 0900  Weight: 189 lb 4.8 oz (85.9 kg)    GENERAL:alert, no distress and comfortable SKIN: No rash NECK: Without mass LYMPH:  no palpable cervical or supraclavicular lymphadenopathy  LUNGS:  normal breathing effort HEART: no lower extremity edema ABDOMEN:abdomen soft, non-tender and normal bowel sounds Musculoskeletal:no focal spinal tenderness NEURO: alert & oriented x 3 with fluent speech, no focal motor/sensory deficits Breast exam: Breasts are symmetrical without nipple discharge or inversion.  S/p right lumpectomy, incisions completely healed.  Small palpable nodule at the upper right  breast lumpectomy site associated with a known calcification.  No other palpable mass or nodularity in either breast or axilla that I could appreciate.  LABORATORY DATA:  I have reviewed the data as listed    Latest Ref Rng & Units 04/27/2022    8:30 AM 10/12/2021    1:43 PM 01/01/2021    8:26 AM  CBC  WBC 4.0 - 10.5 K/uL 5.6  6.8    Hemoglobin 12.0 - 15.0 g/dL 13.8  13.2  13.6      Hematocrit 36.0 - 46.0 % 41.0  40.6    Platelets 150 - 400 K/uL 269  254       This result is from an external source.        Latest Ref Rng & Units 04/27/2022    8:30 AM 10/12/2021    1:43 PM 05/26/2021    8:03 AM  CMP  Glucose 70 - 99 mg/dL 93  112  88   BUN 8 - 23 mg/dL '17  21  15   ' Creatinine 0.44 - 1.00 mg/dL 0.76  0.69  0.75   Sodium 135 - 145 mmol/L 136  140   142   Potassium 3.5 - 5.1 mmol/L 4.4  3.7  4.9   Chloride 98 - 111 mmol/L 103  104  104   CO2 22 - 32 mmol/L '28  30  25   ' Calcium 8.9 - 10.3 mg/dL 9.6  9.9  10.2   Total Protein 6.5 - 8.1 g/dL 7.6  7.1  7.2   Total Bilirubin 0.3 - 1.2 mg/dL 0.6  0.5  0.5   Alkaline Phos 38 - 126 U/L 85  82  117   AST 15 - 41 U/L 15  18  45   ALT 0 - 44 U/L 12  13  44       RADIOGRAPHIC STUDIES: I have personally reviewed the radiological images as listed and agreed with the findings in the report. No results found.   ASSESSMENT & PLAN: Heather Hayes is a 61 y.o. female with      1. Malignant neoplasm of upper-outer quadrant of right breast, Stage IA, p (T1b,N0,M0), ER/PR+, HER2-, Grade II -Diagnosed in 10/2018, s/p right lumpectomy and radiation.  Given low-grade strongly ER positive disease, Oncotype was not recommended  -She began antiestrogen therapy with anastrozole on 03/2019, ultimately switched to exemestane 05/2021 due to worsening joint pain and low libido -Breast density is category D, mammogram 09/2021 showed a 9 calcification at the lumpectomy site, otherwise negative negative -Ms. Debo is clinically doing well.  Breast exam compatible with known calcification, otherwise benign.  Labs are unremarkable.  No clinical concern for breast cancer recurrence. Continue surveillance  -She is not tolerating exemestane as well now, more anxiety, weight gain, and persistent very low libido.  Given the negative impact on her quality of life, the plan is to hold for 4 weeks to see if her side effects and quality of life improve.  -Phone follow-up in 4 weeks, may switch to tamoxifen depending on how she is doing at that time.  We briefly reviewed potential benefit and side effects, she would be a good candidate. -if she does switch to another anti-estrogen, phone med check in 3 months, then in person f/up in 6 months    2. Bone Health  -Her 10/25/17 and 10/08/2020 DEXA scans were normal  -She is aware of  AI's can reduce her bone density. -Continue calcium and vitamin D 3-5 times  a week and weightbearing exercise -Repeat 10/2022 with mammogram   3.  Genetic testing 2019, negative   4.  Arthritis and bursitis of her hips -Previously stable on AI, controlled with meloxicam -Worsened on anastrozole, proved with switch to exemestane   Plan: -Labs reviewed  -Continue breast cancer surveillance -Hold exemestane, phone follow-up in 1 month  No problem-specific Assessment & Plan notes found for this encounter.   No orders of the defined types were placed in this encounter.  All questions were answered. The patient knows to call the clinic with any problems, questions or concerns. No barriers to learning was detected. I spent 20 minutes counseling the patient face to face. The total time spent in the appointment was 30 minutes and more than 50% was on counseling and review of test results.      Alla Feeling, NP 04/27/22

## 2022-04-27 ENCOUNTER — Inpatient Hospital Stay: Payer: BC Managed Care – PPO | Attending: Nurse Practitioner | Admitting: Nurse Practitioner

## 2022-04-27 ENCOUNTER — Inpatient Hospital Stay: Payer: BC Managed Care – PPO

## 2022-04-27 ENCOUNTER — Other Ambulatory Visit: Payer: Self-pay

## 2022-04-27 ENCOUNTER — Encounter: Payer: Self-pay | Admitting: Nurse Practitioner

## 2022-04-27 VITALS — BP 156/84 | HR 61 | Temp 98.3°F | Resp 16 | Ht 63.0 in | Wt 189.3 lb

## 2022-04-27 DIAGNOSIS — Z170 Estrogen receptor positive status [ER+]: Secondary | ICD-10-CM | POA: Diagnosis not present

## 2022-04-27 DIAGNOSIS — Z79811 Long term (current) use of aromatase inhibitors: Secondary | ICD-10-CM | POA: Diagnosis not present

## 2022-04-27 DIAGNOSIS — C50411 Malignant neoplasm of upper-outer quadrant of right female breast: Secondary | ICD-10-CM

## 2022-04-27 DIAGNOSIS — Z923 Personal history of irradiation: Secondary | ICD-10-CM | POA: Insufficient documentation

## 2022-04-27 LAB — CBC WITH DIFFERENTIAL (CANCER CENTER ONLY)
Abs Immature Granulocytes: 0.01 10*3/uL (ref 0.00–0.07)
Basophils Absolute: 0.1 10*3/uL (ref 0.0–0.1)
Basophils Relative: 1 %
Eosinophils Absolute: 0.2 10*3/uL (ref 0.0–0.5)
Eosinophils Relative: 3 %
HCT: 41 % (ref 36.0–46.0)
Hemoglobin: 13.8 g/dL (ref 12.0–15.0)
Immature Granulocytes: 0 %
Lymphocytes Relative: 28 %
Lymphs Abs: 1.6 10*3/uL (ref 0.7–4.0)
MCH: 31.5 pg (ref 26.0–34.0)
MCHC: 33.7 g/dL (ref 30.0–36.0)
MCV: 93.6 fL (ref 80.0–100.0)
Monocytes Absolute: 0.5 10*3/uL (ref 0.1–1.0)
Monocytes Relative: 8 %
Neutro Abs: 3.3 10*3/uL (ref 1.7–7.7)
Neutrophils Relative %: 60 %
Platelet Count: 269 10*3/uL (ref 150–400)
RBC: 4.38 MIL/uL (ref 3.87–5.11)
RDW: 12.3 % (ref 11.5–15.5)
WBC Count: 5.6 10*3/uL (ref 4.0–10.5)
nRBC: 0 % (ref 0.0–0.2)

## 2022-04-27 LAB — CMP (CANCER CENTER ONLY)
ALT: 12 U/L (ref 0–44)
AST: 15 U/L (ref 15–41)
Albumin: 4.2 g/dL (ref 3.5–5.0)
Alkaline Phosphatase: 85 U/L (ref 38–126)
Anion gap: 5 (ref 5–15)
BUN: 17 mg/dL (ref 8–23)
CO2: 28 mmol/L (ref 22–32)
Calcium: 9.6 mg/dL (ref 8.9–10.3)
Chloride: 103 mmol/L (ref 98–111)
Creatinine: 0.76 mg/dL (ref 0.44–1.00)
GFR, Estimated: >60 mL/min (ref >60)
Glucose, Bld: 93 mg/dL (ref 70–99)
Potassium: 4.4 mmol/L (ref 3.5–5.1)
Sodium: 136 mmol/L (ref 135–145)
Total Bilirubin: 0.6 mg/dL (ref 0.3–1.2)
Total Protein: 7.6 g/dL (ref 6.5–8.1)

## 2022-04-27 NOTE — Progress Notes (Unsigned)
TeleHealth Visit:  This visit was completed with telemedicine (audio/video) technology. Heather Hayes has verbally consented to this TeleHealth visit. The patient is located at home, the provider is located at home. The participants in this visit include the listed provider and patient. The visit was conducted today via MyChart video.  OBESITY Heather Hayes is here to discuss her progress with her obesity treatment plan along with follow-up of her obesity related diagnoses.   Today's visit was # 73 Starting weight: 191 lbs Starting date: 06/01/2019 Weight at last in office visit: 181 lbs on 03/31/22 Total weight loss: 10 lbs at last in office visit on 03/31/22. Today's reported weight: 188 lbs   Nutrition Plan: the Category 2 Plan.- 50% adherence  Current exercise:  [place] states she is walking 5,000 - 10000 steps daily.    Interim History: Heather Hayes reports a lot of stress over the past few weeks at work which has caused her to be off plan.  She is generally on plan at breakfast and lunch but then dinner is a challenge because of lack of time and meal prep.  She also has a lot of cravings after dinner-especially for sweets.  Work has been very busy says she has been getting 10,000 steps on some days.  She  followed up with a provider at [facility] yesterday and they allowed her to take a month off of Aromasin to see if she could lose weight without it.  They may start tamoxifen after that.  Assessment/Plan:  1. Insulin Resistance Fasting insulin was up around 15 when she first started a program but most recent level was 6.4 on 11/09/2021. She reports polyphagia and cravings.  Has been on bupropion in the past but did not feel it helped much. Medication(s): none Lab Results  Component Value Date   HGBA1C 5.5 11/09/2021   Lab Results  Component Value Date   INSULIN 6.4 11/09/2021   INSULIN 6.7 05/26/2021   INSULIN 6.5 09/29/2020   INSULIN 8.0 05/20/2020   INSULIN 7.5 01/22/2020     Plan Would like to start her on GLP-1 but due to availability issues we will try metformin. New prescription for metformin 500 mg daily with lunch.  2. Eating disorder/emotional eating [place] has had issues with stress/emotional eating. Especially craves sweets at night.  She grew up always having dessert after dinner.  She is on Lexapro 10 mg and feels this really helps her overall mood. We have had her on bupropion but she says she really didn't notice a difference with cravings.  Plan: Refill Lexapro 10 mg daily. Consider starting Topamax at future visit.  3. Obesity: Current BMI 32.1 Heather Hayes is currently in the action stage of change. As such, her goal is to continue with weight loss efforts.  She has agreed to the Category 2 Plan.   Try to meal prep 2 meals with leftovers on Sunday to eat during there week.  Exercise goals:  as is  Behavioral modification strategies: increasing lean protein intake, decreasing simple carbohydrates, meal planning and cooking strategies, and planning for success.  Heather Hayes has agreed to follow-up with our clinic in 4 weeks.   No orders of the defined types were placed in this encounter.   Medications Discontinued During This Encounter  Medication Reason   escitalopram (LEXAPRO) 10 MG tablet Reorder     Meds ordered this encounter  Medications   metFORMIN (GLUCOPHAGE) 500 MG tablet    Sig: Take 1 tablet (500 mg total) by mouth daily with  lunch.    Dispense:  30 tablet    Refill:  0    Order Specific Question:   Supervising Provider    Answer:   Loyal Gambler E [[id number]]   escitalopram (LEXAPRO) 10 MG tablet    Sig: Take 1 tablet (10 mg total) by mouth daily.    Dispense:  90 tablet    Refill:  0    Order Specific Question:   Supervising Provider    Answer:   Dell Ponto [[id number]]      Objective:   VITALS: Per patient if applicable, see vitals. GENERAL: Alert and in no acute distress. CARDIOPULMONARY: No increased WOB. Speaking in clear  sentences.  PSYCH: Pleasant and cooperative. Speech normal rate and rhythm. Affect is appropriate. Insight and judgement are appropriate. Attention is focused, linear, and appropriate.  NEURO: Oriented as arrived to appointment on time with no prompting.   Lab Results  Component Value Date   CREATININE 0.76 04/27/2022   BUN 17 04/27/2022   NA 136 04/27/2022   K 4.4 04/27/2022   CL 103 04/27/2022   CO2 28 04/27/2022   Lab Results  Component Value Date   ALT 12 04/27/2022   AST 15 04/27/2022   ALKPHOS 85 04/27/2022   BILITOT 0.6 04/27/2022   Lab Results  Component Value Date   HGBA1C 5.5 11/09/2021   HGBA1C 5.5 05/26/2021   HGBA1C 5.6 09/29/2020   HGBA1C 5.5 01/22/2020   HGBA1C 5.5 09/05/2019   Lab Results  Component Value Date   INSULIN 6.4 11/09/2021   INSULIN 6.7 05/26/2021   INSULIN 6.5 09/29/2020   INSULIN 8.0 05/20/2020   INSULIN 7.5 01/22/2020   Lab Results  Component Value Date   TSH 1.290 11/09/2021   Lab Results  Component Value Date   CHOL 219 (H) 11/09/2021   HDL 77 11/09/2021   LDLCALC 121 (H) 11/09/2021   TRIG 124 11/09/2021   CHOLHDL 2.8 05/20/2020   Lab Results  Component Value Date   WBC 5.6 04/27/2022   HGB 13.8 04/27/2022   HCT 41.0 04/27/2022   MCV 93.6 04/27/2022   PLT 269 04/27/2022   No results found for: "IRON", "TIBC", "FERRITIN" Lab Results  Component Value Date   VD25OH 81.0 11/09/2021   VD25OH 86.4 05/26/2021   VD25OH 77.6 09/29/2020    Attestation Statements:   Reviewed by clinician on day of visit: allergies, medications, problem list, medical history, surgical history, family history, social history, and previous encounter notes.

## 2022-04-28 ENCOUNTER — Encounter (INDEPENDENT_AMBULATORY_CARE_PROVIDER_SITE_OTHER): Payer: Self-pay | Admitting: Family Medicine

## 2022-04-28 ENCOUNTER — Telehealth (INDEPENDENT_AMBULATORY_CARE_PROVIDER_SITE_OTHER): Payer: BC Managed Care – PPO | Admitting: Family Medicine

## 2022-04-28 DIAGNOSIS — F5089 Other specified eating disorder: Secondary | ICD-10-CM

## 2022-04-28 DIAGNOSIS — E88819 Insulin resistance, unspecified: Secondary | ICD-10-CM

## 2022-04-28 DIAGNOSIS — E8881 Metabolic syndrome: Secondary | ICD-10-CM | POA: Diagnosis not present

## 2022-04-28 DIAGNOSIS — E669 Obesity, unspecified: Secondary | ICD-10-CM | POA: Diagnosis not present

## 2022-04-28 DIAGNOSIS — Z6832 Body mass index (BMI) 32.0-32.9, adult: Secondary | ICD-10-CM

## 2022-04-28 MED ORDER — METFORMIN HCL 500 MG PO TABS: 500.0000 mg | ORAL_TABLET | Freq: Every day | ORAL | 0 refills | Status: DC

## 2022-04-28 MED ORDER — ESCITALOPRAM OXALATE 10 MG PO TABS
10.0000 mg | ORAL_TABLET | Freq: Every day | ORAL | 0 refills | Status: DC
Start: 2022-04-28 — End: 2022-09-02

## 2022-05-11 ENCOUNTER — Encounter: Payer: Self-pay | Admitting: Pulmonary Disease

## 2022-05-20 DIAGNOSIS — Z01419 Encounter for gynecological examination (general) (routine) without abnormal findings: Secondary | ICD-10-CM | POA: Diagnosis not present

## 2022-05-20 DIAGNOSIS — Z1151 Encounter for screening for human papillomavirus (HPV): Secondary | ICD-10-CM | POA: Diagnosis not present

## 2022-05-20 DIAGNOSIS — R319 Hematuria, unspecified: Secondary | ICD-10-CM | POA: Diagnosis not present

## 2022-05-20 DIAGNOSIS — Z124 Encounter for screening for malignant neoplasm of cervix: Secondary | ICD-10-CM | POA: Diagnosis not present

## 2022-05-20 DIAGNOSIS — Z6833 Body mass index (BMI) 33.0-33.9, adult: Secondary | ICD-10-CM | POA: Diagnosis not present

## 2022-05-25 NOTE — Progress Notes (Unsigned)
[place]   [phone] [phone]   Clinic Follow up Note   Patient Care Team: Molli Posey, MD as PCP - General (Obstetrics and Gynecology) Mauro Kaufmann, RN as Oncology Nurse Navigator Rockwell Germany, RN as Oncology Nurse Navigator Excell Seltzer, MD (Inactive) as Consulting Physician (General Surgery) Truitt Merle, MD as Consulting Physician (Hematology) Gery Pray, MD as Consulting Physician (Radiation Oncology) Alla Feeling, NP as Nurse Practitioner (Nurse Practitioner) 05/26/2022   I connected with Sinda Du on 05/26/22 at  9:30 AM EDT by telephone visit and verified that I am speaking with the correct person using two identifiers.   I discussed the limitations, risks, security and privacy concerns of performing an evaluation and management service by telemedicine and the availability of in-person appointments. I also discussed with the patient that there may be a patient responsible charge related to this service. The patient expressed understanding and agreed to proceed.   Other persons participating in the visit and their role in the encounter: None   Patient's location: work Provider's location: Psychologist, occupational Complaint: Follow up SE's from [place]: Oncology History Overview Note  Cancer Staging Malignant neoplasm of upper-outer quadrant of right breast in female, estrogen receptor positive ([place]) Staging form: Breast, AJCC 8th Edition - Clinical stage from 10/02/2018: Stage IA (cT1b, cN0, cM0, G2, ER+, PR+, HER2-) - Signed by Truitt Merle, MD on 10/10/2018 - Pathologic stage from 10/27/2018: Stage IA (pT1b, pN0, cM0, G1, ER+, PR+, HER2-) - Signed by Truitt Merle, MD on 02/26/2019     Malignant neoplasm of upper-outer quadrant of right breast in female, estrogen receptor positive ([place])  09/27/2018 Mammogram   [facility] Mammogram 09/27/18  IMPRESSION: 1. Suspicious right breast  mass measures 7 x 7 x 6 mm with associated vascularity at the 12 o'clock position 7 cm from the nipple. 2. No suspicious right axillary lymphadenopathy.   10/02/2018 Cancer Staging   Staging form: Breast, AJCC 8th Edition - Clinical stage from 10/02/2018: Stage IA (cT1b, cN0, cM0, G2, ER+, PR+, HER2-) - Signed by Truitt Merle, MD on 10/10/2018   10/02/2018 Initial Biopsy   Diagnosis 10/02/18 Breast, right, needle core biopsy, 12 o'clock - INVASIVE DUCTAL CARCINOMA.   10/02/2018 Receptors her2   Results: IMMUNOHISTOCHEMICAL AND MORPHOMETRIC ANALYSIS PERFORMED MANUALLY The tumor cells are NEGATIVE for Her2 (1+). Estrogen Receptor: 95%, POSITIVE, STRONG STAINING INTENSITY Progesterone Receptor: 95%, POSITIVE, STRONG STAINING INTENSITY Proliferation Marker Ki67: 5%   10/04/2018 Initial Diagnosis   Malignant neoplasm of upper-outer quadrant of right breast in female, estrogen receptor positive ([place])   10/27/2018 Surgery   RIGHT BREAST LUMPECTOMY WITH RADIOACTIVE SEED AND SENTINEL LYMPH NODE BIOPSY by Dr Excell Seltzer   10/27/2018 Pathology Results   Diagnosis 10/27/18 1. Breast, lumpectomy, Right - INVASIVE DUCTAL CARCINOMA WITH CALCIFICATIONS, GRADE I/III, SPANNING 0.7 CM. - THE SURGICAL RESECTION MARGIN ARE NEGATIVE FOR CARCINOMA. - SEE ONCOLOGY TABLE BELOW. 2. Lymph node, sentinel, biopsy, Right - THERE IS NO EVIDENCE OF CARCINOMA IN 1 OF 1 LYMPH NODE (0/1). 3. Lymph node, sentinel, biopsy, Right - THERE IS NO EVIDENCE OF CARCINOMA IN 1 OF 1 LYMPH NODE (0/1).    - 01/29/2019 Radiation Therapy   Rad[facility] therapy at [facility] on 01/29/19.    10/27/2018 Cancer Staging   Staging form: Breast, AJCC 8th Edition - Pathologic stage from 10/27/2018: Stage IA (pT1b, pN0, cM0, G1, ER+, PR+, HER2-) - Signed by Truitt Merle, MD on  02/26/2019   03/2019 -  Anti-estrogen oral therapy   Anastrozole 58m daily starting in 03/2019    07/09/2019 Survivorship   SCP delivered by LCira Rue NP      CURRENT  THERAPY: Exemestane on hold; pending start of tamoxifen   INTERVAL HISTORY: Ms. BDonosopresents by phone as scheduled, to discuss anti-estrogen. Last seen by me 04/27/22. Due to worsening side effects (anxiety, weight gain, low libido) we held exemestane for a month. Since then she notices more energy especially after work where she enjoys being outside now. Her joints aren't stiff/aching in the morning and has less pain during the day. Libido remains low, unchanged. And her anxiety is stable. She had this prior to having children, describes it as "elephant on my chest" feeling that is relieved with 1/2 tab klonopin PRN.   All other systems were reviewed with the patient and are negative.  MEDICAL HISTORY:  Past Medical History:  Diagnosis Date   Anxiety    Arthritis    Back pain    Bursitis    Cancer ([place]    right breast cancer 09-2018   Constipation    GERD (gastroesophageal reflux disease)    some heart burn from Arimidex   History of radiation therapy    completed 01-29-2019   Hyperlipidemia    Joint pain    Personal history of radiation therapy    Rheumatic fever    Swallowing difficulty     SURGICAL HISTORY: Past Surgical History:  Procedure Laterality Date   BREAST LUMPECTOMY Right 10/27/2018   BREAST LUMPECTOMY WITH RADIOACTIVE SEED AND SENTINEL LYMPH NODE BIOPSY Right 10/27/2018   Procedure: RIGHT BREAST LUMPECTOMY WITH RADIOACTIVE SEED AND SENTINEL LYMPH NODE BIOPSY;  Surgeon: HExcell Seltzer MD;  Location: [facility]  Service: General;  Laterality: Right;   CERVICAL SPINE SURGERY  2009   COLONOSCOPY     ENDOMETRIAL ABLATION     POLYPECTOMY     TUBAL LIGATION     tube ligation       I have reviewed the social history and family history with the patient and they are unchanged from previous note.  ALLERGIES:  is allergic to compazine [prochlorperazine edisylate].  MEDICATIONS:  Current Outpatient Medications  Medication Sig Dispense Refill    Cholecalciferol (VITAMIN D) 125 MCG (5000 UT) CAPS PO 5 days per week. 30 capsule 0   clonazePAM (KLONOPIN) 0.5 MG tablet Take 0.5 mg by mouth 2 (two) times daily as needed for anxiety.     escitalopram (LEXAPRO) 10 MG tablet Take 1 tablet (10 mg total) by mouth daily. 90 tablet 0   exemestane (AROMASIN) 25 MG tablet TAKE 1 TABLET (25 MG TOTAL) BY MOUTH DAILY AFTER BREAKFAST. 90 tablet 2   meloxicam (MOBIC) 15 MG tablet Take 15 mg by mouth daily.     metFORMIN (GLUCOPHAGE) 500 MG tablet Take 1 tablet (500 mg total) by mouth daily with lunch. 30 tablet 0   No current facility-administered medications for this visit.    PHYSICAL EXAMINATION:  There were no vitals filed for this visit. There were no vitals filed for this visit.  Patient appears well by phone.  Voice is strong, speech is clear.  Mood/affect appear normal for situation.  No cough or conversational dyspnea.  LABORATORY DATA:  I have reviewed the data as listed    Latest Ref Rng & Units 04/27/2022    8:30 AM 10/12/2021    1:43 PM 01/01/2021    8:26 AM  CBC  WBC 4.0 - 10.5 K/uL 5.6  6.8    Hemoglobin 12.0 - 15.0 g/dL 13.8  13.2  13.6      Hematocrit 36.0 - 46.0 % 41.0  40.6    Platelets 150 - 400 K/uL 269  254       This result is from an external source.        Latest Ref Rng & Units 04/27/2022    8:30 AM 10/12/2021    1:43 PM 05/26/2021    8:03 AM  CMP  Glucose 70 - 99 mg/dL 93  112  88   BUN 8 - 23 mg/dL '17  21  15   ' Creatinine 0.44 - 1.00 mg/dL 0.76  0.69  0.75   Sodium 135 - 145 mmol/L 136  140  142   Potassium 3.5 - 5.1 mmol/L 4.4  3.7  4.9   Chloride 98 - 111 mmol/L 103  104  104   CO2 22 - 32 mmol/L '28  30  25   ' Calcium 8.9 - 10.3 mg/dL 9.6  9.9  10.2   Total Protein 6.5 - 8.1 g/dL 7.6  7.1  7.2   Total Bilirubin 0.3 - 1.2 mg/dL 0.6  0.5  0.5   Alkaline Phos 38 - 126 U/L 85  82  117   AST 15 - 41 U/L 15  18  45   ALT 0 - 44 U/L 12  13  44       RADIOGRAPHIC STUDIES: I have personally reviewed the  radiological images as listed and agreed with the findings in the report. No results found.   ASSESSMENT & PLAN: Heather Hayes is a 61 y.o. female with    Antiestrogen SE's: Joint pain, fatigue, anxiety, weight gain, low libido -She tried anastrozole first from 03/22/2019 - 05/21/2021, switched to exemestane due to worsening joint pain and low libido -Joint pain improved initially on exemestane but has worsened lately, along with fatigue, weight gain, and anxiety (related to other factors as well and present prior to childbearing) -Due to the negative impact on her quality of life, Exemestane has been on hold for a month, some symptoms have improved including joint pain and fatigue. Libido has not improved. Anxiety is stable and well managed -We discussed further antiestrogen therapy vs surveillance alone. She is hesitant to stop antiestrogen therapy altogether.  - Given that she did not tolerate anastrozole I do not recommend trying letrozole.  We will hold off on exemestane rechallenge for now. -She has no prior history of endometrial/uterine cancer or DVT/thrombosis, she is otherwise healthy and active.  She is a good candidate for tamoxifen.  We reviewed the potential side effects including the above. She has family members who tolerated this well and would like to try -I recommend tamoxifen for at least 2 years to complete 5 years total antiestrogen therapy -Side effect check phone f/up in 3 months, then routine f/up in person in March as scheduled -She knows to contact me sooner if she is not tolerating well or has other concerns.   Malignant neoplasm of upper-outer quadrant of right breast, Stage IA, p (T1b,N0,M0), ER/PR+, HER2-, Grade II -Diagnosed in 10/2018, s/p right lumpectomy and radiation.  Given low-grade strongly ER positive disease, Oncotype was not recommended  -She began antiestrogen therapy with anastrozole on 03/2019, ultimately switched to exemestane 05/2021 due to worsening  joint pain and low libido -Breast density is category D, mammogram 09/2021 showed a 9 calcification at the lumpectomy  site, otherwise negative negative -04/27/22: Ms. Edelen is clinically doing well.  Breast exam compatible with known calcification, otherwise benign.  Labs are unremarkable.  No clinical concern for breast cancer recurrence. Continue surveillance    3. Bone Health  -Her 10/25/17 and 10/08/2020 DEXA scans were normal  -She is aware of AI's can reduce her bone density. -Continue calcium and vitamin D 3-5 times a week and weightbearing exercise -Repeat 10/2022 with mammogram   4.  Genetic testing 2019, negative   5.  Arthritis and bursitis of her hips -Previously stable on AI, controlled with meloxicam -Worsened on anastrozole, improved initially on exemestane then worsened after 1 year -will monitor on Tamoxifen   PLAN: -Start Tamoxifen in the next week -Phone tox check in 3 months, or sooner if needed -Next in person surveillance visit 10/26/22 as scheduled -Mammogram in March   I discussed the assessment and treatment plan with the patient. The patient was provided an opportunity to ask questions and all were answered. The patient agreed with the plan and demonstrated an understanding of the instructions.   The patient was advised to call back or seek an in-person evaluation if the symptoms worsen or if the condition fails to improve as anticipated. I spent 15 minutes non face to face time in the encounter and more than 50% was on counseling and chart review.      Alla Feeling, NP 05/26/22

## 2022-05-26 ENCOUNTER — Ambulatory Visit (INDEPENDENT_AMBULATORY_CARE_PROVIDER_SITE_OTHER): Payer: BC Managed Care – PPO | Admitting: Family Medicine

## 2022-05-26 ENCOUNTER — Inpatient Hospital Stay: Payer: BC Managed Care – PPO | Attending: Nurse Practitioner | Admitting: Nurse Practitioner

## 2022-05-26 ENCOUNTER — Encounter: Payer: Self-pay | Admitting: Nurse Practitioner

## 2022-05-26 ENCOUNTER — Encounter (INDEPENDENT_AMBULATORY_CARE_PROVIDER_SITE_OTHER): Payer: Self-pay | Admitting: Family Medicine

## 2022-05-26 VITALS — BP 141/83 | HR 59 | Temp 97.8°F | Ht 63.0 in | Wt 183.0 lb

## 2022-05-26 DIAGNOSIS — E669 Obesity, unspecified: Secondary | ICD-10-CM | POA: Diagnosis not present

## 2022-05-26 DIAGNOSIS — C50411 Malignant neoplasm of upper-outer quadrant of right female breast: Secondary | ICD-10-CM | POA: Diagnosis not present

## 2022-05-26 DIAGNOSIS — F419 Anxiety disorder, unspecified: Secondary | ICD-10-CM | POA: Diagnosis not present

## 2022-05-26 DIAGNOSIS — G4730 Sleep apnea, unspecified: Secondary | ICD-10-CM

## 2022-05-26 DIAGNOSIS — Z6832 Body mass index (BMI) 32.0-32.9, adult: Secondary | ICD-10-CM

## 2022-05-26 DIAGNOSIS — Z170 Estrogen receptor positive status [ER+]: Secondary | ICD-10-CM

## 2022-05-26 DIAGNOSIS — Z853 Personal history of malignant neoplasm of breast: Secondary | ICD-10-CM

## 2022-05-26 MED ORDER — TAMOXIFEN CITRATE 20 MG PO TABS: 20.0000 mg | ORAL_TABLET | Freq: Every day | ORAL | 0 refills | Status: DC

## 2022-06-02 ENCOUNTER — Encounter: Payer: Self-pay | Admitting: Nurse Practitioner

## 2022-06-07 NOTE — Telephone Encounter (Signed)
I called pt & scheduled hst.  Will route back to triage to close MyChart message.

## 2022-06-14 ENCOUNTER — Ambulatory Visit: Payer: BC Managed Care – PPO

## 2022-06-14 DIAGNOSIS — G4733 Obstructive sleep apnea (adult) (pediatric): Secondary | ICD-10-CM | POA: Diagnosis not present

## 2022-06-14 DIAGNOSIS — R0683 Snoring: Secondary | ICD-10-CM

## 2022-06-14 NOTE — Progress Notes (Signed)
Chief Complaint:   OBESITY Heather Hayes is here to discuss her progress with her obesity treatment plan along with follow-up of her obesity related diagnoses. Heather Hayes is on the Category 2 Plan and states she is following her eating plan approximately 60-65% of the time. [place] states she is walking 5,000 steps per day.   Today's visit was #: 51 Starting weight: 191 lbs Starting date: 06/01/2019 Today's weight: 183 lbs Today's date: 05/26/2022 Total lbs lost to date: 8 lbs Total lbs lost since last in-office visit: +2 lbs  Interim History: She started metformin 500 mg with lunch for carb/sugar cravings.  She has temptations at work.  Husband is a junk food eater.  Tends to have the all or none mentality eater.  She has been tracking steps and gardening.  She is planning a trip to [place] in January. She feels that metformin has not helped.   Subjective:   1. Hx of breast cancer Diagnosed in 2020.  Discontinued Anastrozole and started Tamoxifen. She is working on body fat.   2. Anxiety She is on Lexapro 10 mg daily, it is helping with anxiety without adverse side effects.  Her husband is supportive.   3. Sleep-related breathing disorder Set up for a home sleep study.  Positive history of OSA and daytime somnolence.  Assessment/Plan:   1. Hx of breast cancer Continue plan per oncology.   2. Anxiety Continue Lexapro 10 mg daily.   3. Sleep-related breathing disorder Follow up with results of home sleep study.   4. Obesity,current BMI 32.6 1) Discontinue metformin.  Last fasting insulin and A1c are normal.   [place] is currently in the action stage of change. As such, her goal is to continue with weight loss efforts. She has agreed to the Category 2 Plan.   Exercise goals:  Increase step goal to >7,000 steps per day.   Behavioral modification strategies: increasing lean protein intake, increasing vegetables, increasing water intake, decreasing eating out, no skipping meals, meal  planning and cooking strategies, keeping healthy foods in the home, better snacking choices, avoiding temptations, and decreasing junk food.  [place] has agreed to follow-up with our clinic in 4 weeks. She was informed of the importance of frequent follow-up visits to maximize her success with intensive lifestyle modifications for her multiple health conditions.   Objective:   Blood pressure (!) 141/83, pulse (!) 59, temperature 97.8 F (36.6 C), height '5\' 3"'$  (1.6 m), weight 183 lb (83 kg), SpO2 99 %. Body mass index is 32.42 kg/m.  General: Cooperative, alert, well developed, in no acute distress. HEENT: Conjunctivae and lids unremarkable. Cardiovascular: Regular rhythm.  Lungs: Normal work of breathing. Neurologic: No focal deficits.   Lab Results  Component Value Date   CREATININE 0.76 04/27/2022   BUN 17 04/27/2022   NA 136 04/27/2022   K 4.4 04/27/2022   CL 103 04/27/2022   CO2 28 04/27/2022   Lab Results  Component Value Date   ALT 12 04/27/2022   AST 15 04/27/2022   ALKPHOS 85 04/27/2022   BILITOT 0.6 04/27/2022   Lab Results  Component Value Date   HGBA1C 5.5 11/09/2021   HGBA1C 5.5 05/26/2021   HGBA1C 5.6 09/29/2020   HGBA1C 5.5 01/22/2020   HGBA1C 5.5 09/05/2019   Lab Results  Component Value Date   INSULIN 6.4 11/09/2021   INSULIN 6.7 05/26/2021   INSULIN 6.5 09/29/2020   INSULIN 8.0 05/20/2020   INSULIN 7.5 01/22/2020   Lab Results  Component  Value Date   TSH 1.290 11/09/2021   Lab Results  Component Value Date   CHOL 219 (H) 11/09/2021   HDL 77 11/09/2021   LDLCALC 121 (H) 11/09/2021   TRIG 124 11/09/2021   CHOLHDL 2.8 05/20/2020   Lab Results  Component Value Date   VD25OH 81.0 11/09/2021   VD25OH 86.4 05/26/2021   VD25OH 77.6 09/29/2020   Lab Results  Component Value Date   WBC 5.6 04/27/2022   HGB 13.8 04/27/2022   HCT 41.0 04/27/2022   MCV 93.6 04/27/2022   PLT 269 04/27/2022   No results found for: "IRON", "TIBC",  "FERRITIN"  Attestation Statements:   Reviewed by clinician on day of visit: allergies, medications, problem list, medical history, surgical history, family history, social history, and previous encounter notes.  I, Davy Pique, am acting as Location manager for Loyal Gambler, DO.  I have reviewed the above documentation for accuracy and completeness, and I agree with the above. Dell Ponto, DO

## 2022-06-16 ENCOUNTER — Telehealth: Payer: Self-pay | Admitting: Pulmonary Disease

## 2022-06-16 DIAGNOSIS — G4733 Obstructive sleep apnea (adult) (pediatric): Secondary | ICD-10-CM | POA: Diagnosis not present

## 2022-06-16 NOTE — Telephone Encounter (Signed)
HST 06/14/22 >> AHI 53.2, SpO2 low 70%   Please inform her that her sleep study shows severe obstructive sleep apnea.  Please arrange for ROV with me or NP to discuss treatment options.

## 2022-06-16 NOTE — Telephone Encounter (Signed)
Atc lmtcb  

## 2022-06-17 DIAGNOSIS — U071 COVID-19: Secondary | ICD-10-CM | POA: Diagnosis not present

## 2022-06-17 NOTE — Telephone Encounter (Signed)
Pt returning call.  [phone] - cell.  Home the rest of the week.

## 2022-06-17 NOTE — Telephone Encounter (Signed)
Spoke with patient  regarding HST results. They verbalized understanding. No further questions.  Nothing further needed at this time.   

## 2022-06-22 NOTE — Progress Notes (Unsigned)
TeleHealth Visit:  This visit was completed with telemedicine (audio/video) technology. Heather Hayes has verbally consented to this TeleHealth visit. The patient is located at home, the provider is located at home. The participants in this visit include the listed provider and patient. The visit was conducted today via MyChart video.  OBESITY Heather Hayes is here to discuss her progress with her obesity treatment plan along with follow-up of her obesity related diagnoses.   Today's visit was # 43 Starting weight: 191 lbs Starting date: 06/01/2019 Weight at last in office visit: 183 lbs on 05/26/22 Total weight loss: 8 lbs at last in office visit on 05/26/22. Today's reported weight: 184 lbs (scales weight heavy)  Nutrition Plan: the Category 2 Plan.   Current exercise: none   Interim History: Heather Hayes had her husband have both been sick with COVID.  Appetite has been down and she has lost a few pounds.  She is still having some taste issues with foods.  She is back at work and feeling better. She plans on getting back on plan within the next few days. Ordering Thanksgiving from a [employer] and will work on portion control.  She will be going to [facility] the week after Christmas and [facility] [employer] January 6.  Assessment/Plan:  1. Obstructive Sleep Apnea Recent home sleep study showed severe sleep apnea.  She has follow-up with pulmonology in 2 days to discuss treatment options.  Plan: Discussed benefits of treating sleep apnea for blood pressure, weight, heart and vascular issues.  2. Obesity: Current BMI 32.4 Heather Hayes is currently in the action stage of change. As such, her goal is to continue with weight loss efforts.  She has agreed to the Category 2 Plan.   Exercise goals: Resume walking for exercise.  Behavioral modification strategies: increasing lean protein intake, decreasing simple carbohydrates, holiday eating strategies , and planning for success.  Heather Hayes has agreed to  follow-up with our clinic in 3 weeks.   No orders of the defined types were placed in this encounter.   There are no discontinued medications.   No orders of the defined types were placed in this encounter.     Objective:   VITALS: Per patient if applicable, see vitals. GENERAL: Alert and in no acute distress. CARDIOPULMONARY: No increased WOB. Speaking in clear sentences.  PSYCH: Pleasant and cooperative. Speech normal rate and rhythm. Affect is appropriate. Insight and judgement are appropriate. Attention is focused, linear, and appropriate.  NEURO: Oriented as arrived to appointment on time with no prompting.   Lab Results  Component Value Date   CREATININE 0.76 04/27/2022   BUN 17 04/27/2022   NA 136 04/27/2022   K 4.4 04/27/2022   CL 103 04/27/2022   CO2 28 04/27/2022   Lab Results  Component Value Date   ALT 12 04/27/2022   AST 15 04/27/2022   ALKPHOS 85 04/27/2022   BILITOT 0.6 04/27/2022   Lab Results  Component Value Date   HGBA1C 5.5 11/09/2021   HGBA1C 5.5 05/26/2021   HGBA1C 5.6 09/29/2020   HGBA1C 5.5 01/22/2020   HGBA1C 5.5 09/05/2019   Lab Results  Component Value Date   INSULIN 6.4 11/09/2021   INSULIN 6.7 05/26/2021   INSULIN 6.5 09/29/2020   INSULIN 8.0 05/20/2020   INSULIN 7.5 01/22/2020   Lab Results  Component Value Date   TSH 1.290 11/09/2021   Lab Results  Component Value Date   CHOL 219 (H) 11/09/2021   HDL 77 11/09/2021   LDLCALC 121 (H)  11/09/2021   TRIG 124 11/09/2021   CHOLHDL 2.8 05/20/2020   Lab Results  Component Value Date   WBC 5.6 04/27/2022   HGB 13.8 04/27/2022   HCT 41.0 04/27/2022   MCV 93.6 04/27/2022   PLT 269 04/27/2022   No results found for: "IRON", "TIBC", "FERRITIN" Lab Results  Component Value Date   VD25OH 81.0 11/09/2021   VD25OH 86.4 05/26/2021   VD25OH 77.6 09/29/2020    Attestation Statements:   Reviewed by clinician on day of visit: allergies, medications, problem list, medical history,  surgical history, family history, social history, and previous encounter notes.

## 2022-06-23 ENCOUNTER — Telehealth (INDEPENDENT_AMBULATORY_CARE_PROVIDER_SITE_OTHER): Payer: BC Managed Care – PPO | Admitting: Family Medicine

## 2022-06-23 ENCOUNTER — Encounter (INDEPENDENT_AMBULATORY_CARE_PROVIDER_SITE_OTHER): Payer: Self-pay | Admitting: Family Medicine

## 2022-06-23 DIAGNOSIS — G4733 Obstructive sleep apnea (adult) (pediatric): Secondary | ICD-10-CM | POA: Diagnosis not present

## 2022-06-23 DIAGNOSIS — Z6832 Body mass index (BMI) 32.0-32.9, adult: Secondary | ICD-10-CM | POA: Diagnosis not present

## 2022-06-23 DIAGNOSIS — E669 Obesity, unspecified: Secondary | ICD-10-CM

## 2022-06-25 ENCOUNTER — Ambulatory Visit: Payer: BC Managed Care – PPO | Admitting: Primary Care

## 2022-06-25 VITALS — BP 132/82 | HR 79 | Temp 98.4°F | Ht 62.5 in | Wt 184.8 lb

## 2022-06-25 DIAGNOSIS — R0683 Snoring: Secondary | ICD-10-CM | POA: Diagnosis not present

## 2022-06-25 DIAGNOSIS — G4733 Obstructive sleep apnea (adult) (pediatric): Secondary | ICD-10-CM | POA: Diagnosis not present

## 2022-06-25 NOTE — Progress Notes (Signed)
Reviewed and agree with assessment/plan.   Chesley Mires, MD [facility] Pulmonary/Critical Care 06/25/2022, 7:04 PM Pager:  [phone]

## 2022-06-25 NOTE — Assessment & Plan Note (Signed)
-   Patient has symptoms of snoring and witnessed apnea.  Home sleep study on 06/14/2022 showed severe obstructive sleep apnea, average AHI 53.2 an hour.  Recommend patient be started on CPAP therapy due to severity of her OSA and clinical symptoms.  She is in agreement with plan.  We will place a DME order for patient to be started on auto CPAP 5 to 15 cm H2O with mask of choice.  Encouraged weight loss and side sleeping position.  She will follow-up 31 to 90 days after starting CPAP therapy for compliance report.

## 2022-06-25 NOTE — Patient Instructions (Addendum)
Home sleep study showed evidence of severe sleep apnea, you had on average 53 apneic/hyponeic events an hour  Untreated sleep apnea puts you at higher risk for cardiac arrhythmias, pulmonary HTN, stroke and diabetes   Treatment options include weight loss, side sleeping position, oral appliance, CPAP therapy or referral to ENT for possible surgical options   Recommendations: - Aim to wear CPAP every night for minimum 4 to 6 hours or longer - Do not drive experiencing excessive daytime sleepiness fatigue - Focus on side sleeping position  - Goal BMI less than 30  Orders: - NEW CPAP auto settings 5-15cm h20 with mask of choice   Follow-up: - 31-90 days after starting CPAP for compliance check   CPAP and BIPAP Information CPAP and BIPAP are methods that use air pressure to keep your airways open and to help you breathe well. CPAP and BIPAP use different amounts of pressure. Your health care provider will tell you whether CPAP or BIPAP would be more helpful for you. CPAP stands for "continuous positive airway pressure." With CPAP, the amount of pressure stays the same while you breathe in (inhale) and out (exhale). BIPAP stands for "bi-level positive airway pressure." With BIPAP, the amount of pressure will be higher when you inhale and lower when you exhale. This allows you to take larger breaths. CPAP or BIPAP may be used in the hospital, or your health care provider may want you to use it at home. You may need to have a sleep study before your health care provider can order a machine for you to use at home. What are the advantages? CPAP or BIPAP can be helpful if you have: Sleep apnea. Chronic obstructive pulmonary disease (COPD). Heart failure. Medical conditions that cause muscle weakness, including muscular dystrophy or amyotrophic lateral sclerosis (ALS). Other problems that cause breathing to be shallow, weak, abnormal, or difficult. CPAP and BIPAP are most commonly used for  obstructive sleep apnea (OSA) to keep the airways from collapsing when the muscles relax during sleep. What are the risks? Generally, this is a safe treatment. However, problems may occur, including: Irritated skin or skin sores if the mask does not fit properly. Dry or stuffy nose or nosebleeds. Dry mouth. Feeling gassy or bloated. Sinus or lung infection if the equipment is not cleaned properly. When should CPAP or BIPAP be used? In most cases, the mask only needs to be worn during sleep. Generally, the mask needs to be worn throughout the night and during any daytime naps. People with certain medical conditions may also need to wear the mask at other times, such as when they are awake. Follow instructions from your health care provider about when to use the machine. What happens during CPAP or BIPAP?  Both CPAP and BIPAP are provided by a small machine with a flexible plastic tube that attaches to a plastic mask that you wear. Air is blown through the mask into your nose or mouth. The amount of pressure that is used to blow the air can be adjusted on the machine. Your health care provider will set the pressure setting and help you find the best mask for you. Tips for using the mask Because the mask needs to be snug, some people feel trapped or closed-in (claustrophobic) when first using the mask. If you feel this way, you may need to get used to the mask. One way to do this is to hold the mask loosely over your nose or mouth and then gradually apply the  mask more snugly. You can also gradually increase the amount of time that you use the mask. Masks are available in various types and sizes. If your mask does not fit well, talk with your health care provider about getting a different one. Some common types of masks include: Full face masks, which fit over the mouth and nose. Nasal masks, which fit over the nose. Nasal pillow or prong masks, which fit into the nostrils. If you are using a mask  that fits over your nose and you tend to breathe through your mouth, a chin strap may be applied to help keep your mouth closed. Use a skin barrier to protect your skin as told by your health care provider. Some CPAP and BIPAP machines have alarms that may sound if the mask comes off or develops a leak. If you have trouble with the mask, it is very important that you talk with your health care provider about finding a way to make the mask easier to tolerate. Do not stop using the mask. There could be a negative impact on your health if you stop using the mask. Tips for using the machine Place your CPAP or BIPAP machine on a secure table or stand near an electrical outlet. Know where the on/off switch is on the machine. Follow instructions from your health care provider about how to set the pressure on your machine and when you should use it. Do not eat or drink while the CPAP or BIPAP machine is on. Food or fluids could get pushed into your lungs by the pressure of the CPAP or BIPAP. For home use, CPAP and BIPAP machines can be rented or purchased through home health care companies. Many different brands of machines are available. Renting a machine before purchasing may help you find out which particular machine works well for you. Your health insurance company may also decide which machine you may get. Keep the CPAP or BIPAP machine and attachments clean. Ask your health care provider for specific instructions. Check the humidifier if you have a dry stuffy nose or nosebleeds. Make sure it is working correctly. Follow these instructions at home: Take over-the-counter and prescription medicines only as told by your health care provider. Ask if you can take sinus medicine if your sinuses are blocked. Do not use any products that contain nicotine or tobacco. These products include cigarettes, chewing tobacco, and vaping devices, such as e-cigarettes. If you need help quitting, ask your health care  provider. Keep all follow-up visits. This is important. Contact a health care provider if: You have redness or pressure sores on your head, face, mouth, or nose from the mask or head gear. You have trouble using the CPAP or BIPAP machine. You cannot tolerate wearing the CPAP or BIPAP mask. Someone tells you that you snore even when wearing your CPAP or BIPAP. Get help right away if: You have trouble breathing. You feel confused. Summary CPAP and BIPAP are methods that use air pressure to keep your airways open and to help you breathe well. If you have trouble with the mask, it is very important that you talk with your health care provider about finding a way to make the mask easier to tolerate. Do not stop using the mask. There could be a negative impact to your health if you stop using the mask. Follow instructions from your health care provider about when to use the machine. This information is not intended to replace advice given to you by your health  care provider. Make sure you discuss any questions you have with your health care provider. Document Revised: 02/25/2021 Document Reviewed: 06/27/2020 Elsevier Patient Education  [place].

## 2022-06-25 NOTE — Progress Notes (Signed)
$'@Patient'J$  ID: Heather Hayes, female    DOB: 1961/07/04, 61 y.o.   MRN: [id number]  Chief Complaint  Patient presents with   Follow-up    Review home sleep study.  Snoring and not sleeping well with some gasping for air during sleep.    Referring provider: Molli Posey, MD  HPI: 61 year old female, never smoked.  Past medical history significant of OSA, sleep-related breathing disorder, history of breast cancer, insulin resistance, obesity.  Previous [facility] encounter:  Her family has been worried about her snoring for years.  She stops breathing while asleep.  Her sister and daughter both use CPAP.  She is convinced her father had sleep apnea also.  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  She falls asleep after dinner or while waiting for her husband to return from work.  She goes to sleep between 930 and 1030 pm.  She falls asleep in few minutes.  She wakes up some times to use the bathroom.  She gets out of bed between 515 and 525 am.  She feels tired in the morning.  She sometimes gets morning headaches.  She does not use anything to help her fall sleep.  She drinks coffee in the morning and later in the day.  She denies sleep walking, sleep talking, bruxism, or nightmares.  There is no history of restless legs.  She denies sleep hallucinations, sleep paralysis, or cataplexy.  The Epworth score is 6 out of 24.  06/25/2022- Interim hx  Patient presents today to review sleep study results. She was seen for sleep consult on 04/13/22 by Dr. Halford Chessman. She has symptoms or snoring and witnessed apnea. She has a family history of sleep apnea. Patient had a home sleep study on 06/14/2022 that showed severe obstructive sleep apnea, AHI 53.2/hour with SpO2 low 70%.  We reviewed treatment options including weight loss, oral appliance, CPAP therapy or referral to ENT for possible surgical options.  She is open to trying CPAP.  Her main goal is to feel more rested  and have more energy.     Allergies  Allergen Reactions   Compazine [Prochlorperazine Edisylate]     Muscle contractures     Immunization History  Administered Date(s) Administered   Influenza,inj,Quad PF,6+ Mos 04/13/2022   Influenza-Unspecified 05/16/2019, 05/14/2020   Zoster, Live 02/19/2019    Past Medical History:  Diagnosis Date   Anxiety    Arthritis    Back pain    Bursitis    Cancer ([facility])    right breast cancer 09-2018   Constipation    GERD (gastroesophageal reflux disease)    some heart burn from Arimidex   History of radiation therapy    completed 01-29-2019   Hyperlipidemia    Joint pain    Personal history of radiation therapy    Rheumatic fever    Swallowing difficulty     Tobacco History: Social History   Tobacco Use  Smoking Status Never  Smokeless Tobacco Never   Counseling given: Not Answered   Outpatient Medications Prior to Visit  Medication Sig Dispense Refill   clonazePAM (KLONOPIN) 0.5 MG tablet Take 0.5 mg by mouth 2 (two) times daily as needed for anxiety.     escitalopram (LEXAPRO) 10 MG tablet Take 1 tablet (10 mg total) by mouth daily. 90 tablet 0   meloxicam (MOBIC) 15 MG tablet Take 15 mg by mouth daily.     tamoxifen (NOLVADEX) 20 MG tablet Take 1  tablet (20 mg total) by mouth daily. 90 tablet 0   No facility-administered medications prior to visit.    Review of Systems  Review of Systems  Constitutional:  Positive for fatigue.  HENT: Negative.    Respiratory: Negative.    Cardiovascular: Negative.     Physical Exam  BP 132/82 (BP Location: Left Arm, Patient Position: Sitting, Cuff Size: Normal)   Pulse 79   Temp 98.4 F (36.9 C) (Oral)   Ht 5' 2.5" (1.588 m)   Wt 184 lb 12.8 oz (83.8 kg)   SpO2 97%   BMI 33.26 kg/m  Physical Exam Constitutional:      Appearance: Normal appearance.  HENT:     Head: Normocephalic and atraumatic.  Cardiovascular:     Rate and Rhythm: Normal rate and regular rhythm.   Pulmonary:     Effort: Pulmonary effort is normal.     Breath sounds: Normal breath sounds.  Musculoskeletal:        General: Normal range of motion.  Skin:    General: Skin is warm and dry.  Neurological:     General: No focal deficit present.     Mental Status: She is alert and oriented to person, place, and time. Mental status is at baseline.  Psychiatric:        Mood and Affect: Mood normal.        Behavior: Behavior normal.        Thought Content: Thought content normal.        Judgment: Judgment normal.      Lab Results:  CBC    Component Value Date/Time   WBC 5.6 04/27/2022 0830   WBC 6.3 09/06/2007 1520   RBC 4.38 04/27/2022 0830   HGB 13.8 04/27/2022 0830   HGB 13.7 09/29/2020 0813   HCT 41.0 04/27/2022 0830   HCT 40.8 09/29/2020 0813   PLT 269 04/27/2022 0830   PLT 275 09/29/2020 0813   MCV 93.6 04/27/2022 0830   MCV 92 09/29/2020 0813   MCH 31.5 04/27/2022 0830   MCHC 33.7 04/27/2022 0830   RDW 12.3 04/27/2022 0830   RDW 12.2 09/29/2020 0813   LYMPHSABS 1.6 04/27/2022 0830   LYMPHSABS 1.3 09/29/2020 0813   MONOABS 0.5 04/27/2022 0830   EOSABS 0.2 04/27/2022 0830   EOSABS 0.1 09/29/2020 0813   BASOSABS 0.1 04/27/2022 0830   BASOSABS 0.0 09/29/2020 0813    BMET    Component Value Date/Time   NA 136 04/27/2022 0830   NA 142 05/26/2021 0803   K 4.4 04/27/2022 0830   CL 103 04/27/2022 0830   CO2 28 04/27/2022 0830   GLUCOSE 93 04/27/2022 0830   BUN 17 04/27/2022 0830   BUN 15 05/26/2021 0803   CREATININE 0.76 04/27/2022 0830   CALCIUM 9.6 04/27/2022 0830   GFRNONAA >60 04/27/2022 0830   GFRAA 106 05/20/2020 0930   GFRAA >60 04/25/2020 0920    BNP No results found for: "BNP"  ProBNP No results found for: "PROBNP"  Imaging: No results found.   Assessment & Plan:   OSA (obstructive sleep apnea) - Patient has symptoms of snoring and witnessed apnea.  Home sleep study on 06/14/2022 showed severe obstructive sleep apnea, average AHI 53.2  an hour.  Recommend patient be started on CPAP therapy due to severity of her OSA and clinical symptoms.  She is in agreement with plan.  We will place a DME order for patient to be started on auto CPAP 5 to 15 cm H2O  with mask of choice.  Encouraged weight loss and side sleeping position.  She will follow-up 31 to 90 days after starting CPAP therapy for compliance report.   Martyn Ehrich, NP 06/25/2022

## 2022-06-25 NOTE — Addendum Note (Signed)
Addended byOralia Rud M on: 06/25/2022 10:08 AM   Modules accepted: Orders

## 2022-07-12 DIAGNOSIS — G4733 Obstructive sleep apnea (adult) (pediatric): Secondary | ICD-10-CM | POA: Diagnosis not present

## 2022-07-14 ENCOUNTER — Ambulatory Visit (INDEPENDENT_AMBULATORY_CARE_PROVIDER_SITE_OTHER): Payer: BC Managed Care – PPO | Admitting: Family Medicine

## 2022-07-29 ENCOUNTER — Telehealth: Payer: Self-pay | Admitting: Nurse Practitioner

## 2022-07-29 NOTE — Telephone Encounter (Signed)
Left patient a voicemail regarding changed appointments

## 2022-08-03 DIAGNOSIS — G4733 Obstructive sleep apnea (adult) (pediatric): Secondary | ICD-10-CM | POA: Diagnosis not present

## 2022-08-03 NOTE — Progress Notes (Unsigned)
TeleHealth Visit:  This visit was completed with telemedicine (audio/video) technology. Heather Hayes has verbally consented to this TeleHealth visit. The patient is located at home, the provider is located at home. The participants in this visit include the listed provider and patient. The visit was conducted today via MyChart video.  OBESITY Heather Hayes is here to discuss her progress with her obesity treatment plan along with follow-up of her obesity related diagnoses.   Today's visit was # 69 Starting weight: 191 lbs Starting date: 06/01/2019 Weight at last in office visit: 183 lbs on 05/26/22 Total weight loss: 8 lbs at last in office visit on 05/26/22. Weight reported at virtual visit on 06/23/22: 184 lbs Today's reported weight: 187 lbs    Nutrition Plan: the Category 2 Plan.    Current exercise:  walked at beach  Interim History:  And reports being off plan over the holidays.  She also took a trip to the beach but did a lot of walking.  She is now back on plan.  She does struggle with some hunger and cravings. Has never been on a GLP-1 agonist since starting our program. Weight has slowly increased over the past year.  Has lost 8 pounds over the past 3 years. She will call her insurance company to check on coverage for Zepbound or [id number].  Generally walks about 5000 steps per day at work. Assessment/Plan:  1. Obstructive Sleep Apnea Recently diagnosed with severe sleep apnea.  Started CPAP 21 days ago and is using it 5 to 7 hours per night.  She feels more rested.  Plan: Continue good compliance with CPAP. Congratulated her on good compliance with CPAP.  2. Insulin Resistance Has mild insulin resistance. Reports polyphagia and cravings at times. Medication(s): none Lab Results  Component Value Date   HGBA1C 5.5 11/09/2021   Lab Results  Component Value Date   INSULIN 6.4 11/09/2021   INSULIN 6.7 05/26/2021   INSULIN 6.5 09/29/2020   INSULIN 8.0 05/20/2020   INSULIN  7.5 01/22/2020    Plan Discussed starting GLP-1 or GLP-1/GIP therapy.  She is open to this. She plans to call her insurance company to see if they cover Zepbound or [id number]. If no coverage, could try getting coverage for Ozempic or Mounjaro.   3. Obesity: Current BMI 32.5 Heather Hayes is currently in the action stage of change. As such, her goal is to continue with weight loss efforts.  She has agreed to the Category 2 Plan.   Exercise goals: Increase steps to 6000/day.  Behavioral modification strategies: increasing lean protein intake, decreasing simple carbohydrates, and planning for success.  Heather Hayes has agreed to follow-up with our clinic in 4 weeks.   No orders of the defined types were placed in this encounter.   There are no discontinued medications.   No orders of the defined types were placed in this encounter.     Objective:   VITALS: Per patient if applicable, see vitals. GENERAL: Alert and in no acute distress. CARDIOPULMONARY: No increased WOB. Speaking in clear sentences.  PSYCH: Pleasant and cooperative. Speech normal rate and rhythm. Affect is appropriate. Insight and judgement are appropriate. Attention is focused, linear, and appropriate.  NEURO: Oriented as arrived to appointment on time with no prompting.   Lab Results  Component Value Date   CREATININE 0.76 04/27/2022   BUN 17 04/27/2022   NA 136 04/27/2022   K 4.4 04/27/2022   CL 103 04/27/2022   CO2 28 04/27/2022   Lab Results  Component Value  Date   ALT 12 04/27/2022   AST 15 04/27/2022   ALKPHOS 85 04/27/2022   BILITOT 0.6 04/27/2022   Lab Results  Component Value Date   HGBA1C 5.5 11/09/2021   HGBA1C 5.5 05/26/2021   HGBA1C 5.6 09/29/2020   HGBA1C 5.5 01/22/2020   HGBA1C 5.5 09/05/2019   Lab Results  Component Value Date   INSULIN 6.4 11/09/2021   INSULIN 6.7 05/26/2021   INSULIN 6.5 09/29/2020   INSULIN 8.0 05/20/2020   INSULIN 7.5 01/22/2020   Lab Results  Component Value Date    TSH 1.290 11/09/2021   Lab Results  Component Value Date   CHOL 219 (H) 11/09/2021   HDL 77 11/09/2021   LDLCALC 121 (H) 11/09/2021   TRIG 124 11/09/2021   CHOLHDL 2.8 05/20/2020   Lab Results  Component Value Date   WBC 5.6 04/27/2022   HGB 13.8 04/27/2022   HCT 41.0 04/27/2022   MCV 93.6 04/27/2022   PLT 269 04/27/2022   No results found for: "IRON", "TIBC", "FERRITIN" Lab Results  Component Value Date   VD25OH 81.0 11/09/2021   VD25OH 86.4 05/26/2021   VD25OH 77.6 09/29/2020    Attestation Statements:   Reviewed by clinician on day of visit: allergies, medications, problem list, medical history, surgical history, family history, social history, and previous encounter notes.  Time spent on visit including the items listed below was 30 minutes.  -preparing to see the patient (e.g., review of tests, history, previous notes) -obtaining and/or reviewing separately obtained history -counseling and educating the patient/family/caregiver -documenting clinical information in the electronic or other health record -ordering medications, tests, or procedures -independently interpreting results and communicating results to the patient/ family/caregiver -referring and communicating with other health care professionals  -care coordination

## 2022-08-04 ENCOUNTER — Encounter (INDEPENDENT_AMBULATORY_CARE_PROVIDER_SITE_OTHER): Payer: Self-pay | Admitting: Family Medicine

## 2022-08-04 ENCOUNTER — Telehealth (INDEPENDENT_AMBULATORY_CARE_PROVIDER_SITE_OTHER): Payer: BC Managed Care – PPO | Admitting: Family Medicine

## 2022-08-04 DIAGNOSIS — E88819 Insulin resistance, unspecified: Secondary | ICD-10-CM

## 2022-08-04 DIAGNOSIS — E669 Obesity, unspecified: Secondary | ICD-10-CM

## 2022-08-04 DIAGNOSIS — Z6832 Body mass index (BMI) 32.0-32.9, adult: Secondary | ICD-10-CM

## 2022-08-04 DIAGNOSIS — G4733 Obstructive sleep apnea (adult) (pediatric): Secondary | ICD-10-CM

## 2022-08-12 DIAGNOSIS — G4733 Obstructive sleep apnea (adult) (pediatric): Secondary | ICD-10-CM | POA: Diagnosis not present

## 2022-08-23 ENCOUNTER — Ambulatory Visit: Payer: BC Managed Care – PPO | Admitting: Primary Care

## 2022-08-23 ENCOUNTER — Encounter: Payer: Self-pay | Admitting: Primary Care

## 2022-08-23 VITALS — BP 122/76 | HR 69 | Ht 63.0 in | Wt 194.6 lb

## 2022-08-23 DIAGNOSIS — G4733 Obstructive sleep apnea (adult) (pediatric): Secondary | ICD-10-CM

## 2022-08-23 NOTE — Patient Instructions (Signed)
Excellent compliant Severe sleep apnea is well controlled on current pressure settings  Follow-up 1 year with Beth NP   CPAP and BIPAP Information CPAP and BIPAP are methods that use air pressure to keep your airways open and to help you breathe well. CPAP and BIPAP use different amounts of pressure. Your health care provider will tell you whether CPAP or BIPAP would be more helpful for you. CPAP stands for "continuous positive airway pressure." With CPAP, the amount of pressure stays the same while you breathe in (inhale) and out (exhale). BIPAP stands for "bi-level positive airway pressure." With BIPAP, the amount of pressure will be higher when you inhale and lower when you exhale. This allows you to take larger breaths. CPAP or BIPAP may be used in the hospital, or your health care provider may want you to use it at home. You may need to have a sleep study before your health care provider can order a machine for you to use at home. What are the advantages? CPAP or BIPAP can be helpful if you have: Sleep apnea. Chronic obstructive pulmonary disease (COPD). Heart failure. Medical conditions that cause muscle weakness, including muscular dystrophy or amyotrophic lateral sclerosis (ALS). Other problems that cause breathing to be shallow, weak, abnormal, or difficult. CPAP and BIPAP are most commonly used for obstructive sleep apnea (OSA) to keep the airways from collapsing when the muscles relax during sleep. What are the risks? Generally, this is a safe treatment. However, problems may occur, including: Irritated skin or skin sores if the mask does not fit properly. Dry or stuffy nose or nosebleeds. Dry mouth. Feeling gassy or bloated. Sinus or lung infection if the equipment is not cleaned properly. When should CPAP or BIPAP be used? In most cases, the mask only needs to be worn during sleep. Generally, the mask needs to be worn throughout the night and during any daytime naps. People  with certain medical conditions may also need to wear the mask at other times, such as when they are awake. Follow instructions from your health care provider about when to use the machine. What happens during CPAP or BIPAP?  Both CPAP and BIPAP are provided by a small machine with a flexible plastic tube that attaches to a plastic mask that you wear. Air is blown through the mask into your nose or mouth. The amount of pressure that is used to blow the air can be adjusted on the machine. Your health care provider will set the pressure setting and help you find the best mask for you. Tips for using the mask Because the mask needs to be snug, some people feel trapped or closed-in (claustrophobic) when first using the mask. If you feel this way, you may need to get used to the mask. One way to do this is to hold the mask loosely over your nose or mouth and then gradually apply the mask more snugly. You can also gradually increase the amount of time that you use the mask. Masks are available in various types and sizes. If your mask does not fit well, talk with your health care provider about getting a different one. Some common types of masks include: Full face masks, which fit over the mouth and nose. Nasal masks, which fit over the nose. Nasal pillow or prong masks, which fit into the nostrils. If you are using a mask that fits over your nose and you tend to breathe through your mouth, a chin strap may be applied to help  keep your mouth closed. Use a skin barrier to protect your skin as told by your health care provider. Some CPAP and BIPAP machines have alarms that may sound if the mask comes off or develops a leak. If you have trouble with the mask, it is very important that you talk with your health care provider about finding a way to make the mask easier to tolerate. Do not stop using the mask. There could be a negative impact on your health if you stop using the mask. Tips for using the  machine Place your CPAP or BIPAP machine on a secure table or stand near an electrical outlet. Know where the on/off switch is on the machine. Follow instructions from your health care provider about how to set the pressure on your machine and when you should use it. Do not eat or drink while the CPAP or BIPAP machine is on. Food or fluids could get pushed into your lungs by the pressure of the CPAP or BIPAP. For home use, CPAP and BIPAP machines can be rented or purchased through home health care companies. Many different brands of machines are available. Renting a machine before purchasing may help you find out which particular machine works well for you. Your health insurance company may also decide which machine you may get. Keep the CPAP or BIPAP machine and attachments clean. Ask your health care provider for specific instructions. Check the humidifier if you have a dry stuffy nose or nosebleeds. Make sure it is working correctly. Follow these instructions at home: Take over-the-counter and prescription medicines only as told by your health care provider. Ask if you can take sinus medicine if your sinuses are blocked. Do not use any products that contain nicotine or tobacco. These products include cigarettes, chewing tobacco, and vaping devices, such as e-cigarettes. If you need help quitting, ask your health care provider. Keep all follow-up visits. This is important. Contact a health care provider if: You have redness or pressure sores on your head, face, mouth, or nose from the mask or head gear. You have trouble using the CPAP or BIPAP machine. You cannot tolerate wearing the CPAP or BIPAP mask. Someone tells you that you snore even when wearing your CPAP or BIPAP. Get help right away if: You have trouble breathing. You feel confused. Summary CPAP and BIPAP are methods that use air pressure to keep your airways open and to help you breathe well. If you have trouble with the mask, it  is very important that you talk with your health care provider about finding a way to make the mask easier to tolerate. Do not stop using the mask. There could be a negative impact to your health if you stop using the mask. Follow instructions from your health care provider about when to use the machine. This information is not intended to replace advice given to you by your health care provider. Make sure you discuss any questions you have with your health care provider. Document Revised: 02/25/2021 Document Reviewed: 06/27/2020 Elsevier Patient Education  [place].

## 2022-08-23 NOTE — Assessment & Plan Note (Signed)
-  HST 06/14/22  >> AHI 53.2/hr. Patient is 100% compliant with CPAP > 4 hours last 30 days. She reports benefit in daytime fatigue with PAP use. Epworth 2 (6). Current CPAP pressure auto settings 5-15cm h2O; Residual AHI 3.2/hr. No issues with mask fit or pressure settings. DME is [facility]. Advised patient continue to wear CPAP every night min 4-6 hours. Focus on side sleeping position and work on weight loss efforts. FU in 1 year or sooner if needed.

## 2022-08-23 NOTE — Progress Notes (Signed)
$'@Patient'q$  ID: Heather Hayes, female    DOB: 1960-11-21, 62 y.o.   MRN: [id number]  Chief Complaint  Patient presents with   Follow-up    OSA    Referring provider: Molli Posey, MD  HPI: 62 year old female, never smoked.  Past medical history significant of OSA, sleep-related breathing disorder, history of breast cancer, insulin resistance, obesity.  Previous [facility] pulmonary encounter:  Her family has been worried about her snoring for years.  She stops breathing while asleep.  Her sister and daughter both use CPAP.  She is convinced her father had sleep apnea also.  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  She falls asleep after dinner or while waiting for her husband to return from work.  She goes to sleep between 930 and 1030 pm.  She falls asleep in few minutes.  She wakes up some times to use the bathroom.  She gets out of bed between 515 and 525 am.  She feels tired in the morning.  She sometimes gets morning headaches.  She does not use anything to help her fall sleep.  She drinks coffee in the morning and later in the day.  She denies sleep walking, sleep talking, bruxism, or nightmares.  There is no history of restless legs.  She denies sleep hallucinations, sleep paralysis, or cataplexy.  The Epworth score is 6 out of 24.  06/25/2022 Patient presents today to review sleep study results. She was seen for sleep consult on 04/13/22 by Dr. Halford Chessman. She has symptoms or snoring and witnessed apnea. She has a family history of sleep apnea. Patient had a home sleep study on 06/14/2022 that showed severe obstructive sleep apnea, AHI 53.2/hour with SpO2 low 70%.  We reviewed treatment options including weight loss, oral appliance, CPAP therapy or referral to ENT for possible surgical options.  She is open to trying CPAP.  Her main goal is to feel more rested and have more energy.  08/23/2022 - Interim hx  Patient presents today for CPAP compliance. Overall she  is doing great. She is 100% compliant with CPAP use last 30 days. No issues with mask fir or pressure settings. More energized in the morning and evening when she gets home from work. Not dragging when she wakes up in the morning. She will still occasionally wake up at night d/t tubing. She is using CPAP nasal mask, wearing nasal pad underneath to prevent skin breakdown. DME is [facility], getting supplies sent regularly/changing mask monthly.  Airview download 07/21/22- 08/19/21 Usage 30/30 days; 100% > 4 hours Average usage 7 hours 6 mins Pressure 5-15cm h20 (12.8cm h20-95%) Airleaks 7L/min (95%) AHI 3.2    Allergies  Allergen Reactions   Compazine [Prochlorperazine Edisylate]     Muscle contractures     Immunization History  Administered Date(s) Administered   Influenza,inj,Quad PF,6+ Mos 04/13/2022   Influenza-Unspecified 05/16/2019, 05/14/2020, 04/13/2022   Zoster, Live 02/19/2019    Past Medical History:  Diagnosis Date   Anxiety    Arthritis    Back pain    Bursitis    Cancer ([place])    right breast cancer 09-2018   Constipation    GERD (gastroesophageal reflux disease)    some heart burn from Arimidex   History of radiation therapy    completed 01-29-2019   Hyperlipidemia    Joint pain    Personal history of radiation therapy    Rheumatic fever    Swallowing difficulty  Tobacco History: Social History   Tobacco Use  Smoking Status Never  Smokeless Tobacco Never   Counseling given: Not Answered   Outpatient Medications Prior to Visit  Medication Sig Dispense Refill   cholecalciferol (VITAMIN D3) 25 MCG (1000 UNIT) tablet 1 tablet Orally Once a day     clonazePAM (KLONOPIN) 0.5 MG tablet Take 0.5 mg by mouth 2 (two) times daily as needed for anxiety.     meloxicam (MOBIC) 15 MG tablet Take 15 mg by mouth daily.     tamoxifen (NOLVADEX) 20 MG tablet Take 1 tablet (20 mg total) by mouth daily. 90 tablet 0   escitalopram (LEXAPRO) 10 MG tablet Take 1 tablet (10  mg total) by mouth daily. 90 tablet 0   No facility-administered medications prior to visit.    Review of Systems  Review of Systems  Constitutional:  Negative for fatigue.  HENT: Negative.    Respiratory: Negative.      Physical Exam  BP 122/76 (BP Location: Right Arm, Patient Position: Sitting, Cuff Size: Normal)   Pulse 69   Ht '5\' 3"'$  (1.6 m)   Wt 194 lb 9.6 oz (88.3 kg)   SpO2 98%   BMI 34.47 kg/m  Physical Exam Constitutional:      Appearance: Normal appearance.  HENT:     Head: Normocephalic and atraumatic.     Mouth/Throat:     Mouth: Mucous membranes are moist.     Pharynx: Oropharynx is clear.  Cardiovascular:     Rate and Rhythm: Normal rate and regular rhythm.  Pulmonary:     Effort: Pulmonary effort is normal.     Breath sounds: Normal breath sounds.  Musculoskeletal:        General: Normal range of motion.     Cervical back: Normal range of motion and neck supple.  Skin:    General: Skin is warm and dry.  Neurological:     General: No focal deficit present.     Mental Status: She is alert and oriented to person, place, and time. Mental status is at baseline.  Psychiatric:        Mood and Affect: Mood normal.        Behavior: Behavior normal.        Thought Content: Thought content normal.        Judgment: Judgment normal.      Lab Results:  CBC    Component Value Date/Time   WBC 5.6 04/27/2022 0830   WBC 6.3 09/06/2007 1520   RBC 4.38 04/27/2022 0830   HGB 13.8 04/27/2022 0830   HGB 13.7 09/29/2020 0813   HCT 41.0 04/27/2022 0830   HCT 40.8 09/29/2020 0813   PLT 269 04/27/2022 0830   PLT 275 09/29/2020 0813   MCV 93.6 04/27/2022 0830   MCV 92 09/29/2020 0813   MCH 31.5 04/27/2022 0830   MCHC 33.7 04/27/2022 0830   RDW 12.3 04/27/2022 0830   RDW 12.2 09/29/2020 0813   LYMPHSABS 1.6 04/27/2022 0830   LYMPHSABS 1.3 09/29/2020 0813   MONOABS 0.5 04/27/2022 0830   EOSABS 0.2 04/27/2022 0830   EOSABS 0.1 09/29/2020 0813   BASOSABS 0.1  04/27/2022 0830   BASOSABS 0.0 09/29/2020 0813    BMET    Component Value Date/Time   NA 136 04/27/2022 0830   NA 142 05/26/2021 0803   K 4.4 04/27/2022 0830   CL 103 04/27/2022 0830   CO2 28 04/27/2022 0830   GLUCOSE 93 04/27/2022 0830   BUN  17 04/27/2022 0830   BUN 15 05/26/2021 0803   CREATININE 0.76 04/27/2022 0830   CALCIUM 9.6 04/27/2022 0830   GFRNONAA >60 04/27/2022 0830   GFRAA 106 05/20/2020 0930   GFRAA >60 04/25/2020 0920    BNP No results found for: "BNP"  ProBNP No results found for: "PROBNP"  Imaging: No results found.   Assessment & Plan:   OSA (obstructive sleep apnea) - HST 06/14/22  >> AHI 53.2/hr. Patient is 100% compliant with CPAP > 4 hours last 30 days. She reports benefit in daytime fatigue with PAP use. Epworth 2 (6). Current CPAP pressure auto settings 5-15cm h2O; Residual AHI 3.2/hr. No issues with mask fit or pressure settings. DME is [facility]. Advised patient continue to wear CPAP every night min 4-6 hours. Focus on side sleeping position and work on weight loss efforts. FU in 1 year or sooner if needed.    Martyn Ehrich, NP 08/23/2022

## 2022-08-25 ENCOUNTER — Other Ambulatory Visit: Payer: Self-pay | Admitting: Nurse Practitioner

## 2022-08-26 ENCOUNTER — Telehealth: Payer: BC Managed Care – PPO | Admitting: Nurse Practitioner

## 2022-08-30 ENCOUNTER — Inpatient Hospital Stay: Payer: BC Managed Care – PPO | Attending: Nurse Practitioner | Admitting: Nurse Practitioner

## 2022-08-30 ENCOUNTER — Encounter: Payer: Self-pay | Admitting: Nurse Practitioner

## 2022-08-30 DIAGNOSIS — C50411 Malignant neoplasm of upper-outer quadrant of right female breast: Secondary | ICD-10-CM

## 2022-08-30 DIAGNOSIS — Z170 Estrogen receptor positive status [ER+]: Secondary | ICD-10-CM | POA: Diagnosis not present

## 2022-08-30 NOTE — Progress Notes (Signed)
Patient Care Team: Molli Posey, MD as PCP - General (Obstetrics and Gynecology) Mauro Kaufmann, RN as Oncology Nurse Navigator Rockwell Germany, RN as Oncology Nurse Navigator Excell Seltzer, MD (Inactive) as Consulting Physician (General Surgery) Truitt Merle, MD as Consulting Physician (Hematology) Gery Pray, MD as Consulting Physician (Radiation Oncology) Alla Feeling, NP as Nurse Practitioner (Nurse Practitioner)   I connected with Heather Hayes on 08/30/22 at 10:00 AM EST by telephone visit and verified that I am speaking with the correct person using two identifiers.   I discussed the limitations, risks, security and privacy concerns of performing an evaluation and management service by telemedicine and the availability of in-person appointments. I also discussed with the patient that there may be a patient responsible charge related to this service. The patient expressed understanding and agreed to proceed.   Other persons participating in the visit and their role in the encounter: None   Patient's location: work  Provider's location: [place] office    CHIEF COMPLAINT: Tamoxifen toxicity check  Oncology History Overview Note  Cancer Staging Malignant neoplasm of upper-outer quadrant of right breast in female, estrogen receptor positive ([place]) Staging form: Breast, AJCC 8th Edition - Clinical stage from 10/02/2018: Stage IA (cT1b, cN0, cM0, G2, ER+, PR+, HER2-) - Signed by Truitt Merle, MD on 10/10/2018 - Pathologic stage from 10/27/2018: Stage IA (pT1b, pN0, cM0, G1, ER+, PR+, HER2-) - Signed by Truitt Merle, MD on 02/26/2019     Malignant neoplasm of upper-outer quadrant of right breast in female, estrogen receptor positive ([place])  09/27/2018 Mammogram   [facility] Mammogram 09/27/18  IMPRESSION: 1. Suspicious right breast mass measures 7 x 7 x 6 mm with associated vascularity at the 12 o'clock position 7 cm from the nipple. 2. No suspicious right axillary  lymphadenopathy.   10/02/2018 Cancer Staging   Staging form: Breast, AJCC 8th Edition - Clinical stage from 10/02/2018: Stage IA (cT1b, cN0, cM0, G2, ER+, PR+, HER2-) - Signed by Truitt Merle, MD on 10/10/2018   10/02/2018 Initial Biopsy   Diagnosis 10/02/18 Breast, right, needle core biopsy, 12 o'clock - INVASIVE DUCTAL CARCINOMA.   10/02/2018 Receptors her2   Results: IMMUNOHISTOCHEMICAL AND MORPHOMETRIC ANALYSIS PERFORMED MANUALLY The tumor cells are NEGATIVE for Her2 (1+). Estrogen Receptor: 95%, POSITIVE, STRONG STAINING INTENSITY Progesterone Receptor: 95%, POSITIVE, STRONG STAINING INTENSITY Proliferation Marker Ki67: 5%   10/04/2018 Initial Diagnosis   Malignant neoplasm of upper-outer quadrant of right breast in female, estrogen receptor positive ([place])   10/27/2018 Surgery   RIGHT BREAST LUMPECTOMY WITH RADIOACTIVE SEED AND SENTINEL LYMPH NODE BIOPSY by Dr Excell Seltzer   10/27/2018 Pathology Results   Diagnosis 10/27/18 1. Breast, lumpectomy, Right - INVASIVE DUCTAL CARCINOMA WITH CALCIFICATIONS, GRADE I/III, SPANNING 0.7 CM. - THE SURGICAL RESECTION MARGIN ARE NEGATIVE FOR CARCINOMA. - SEE ONCOLOGY TABLE BELOW. 2. Lymph node, sentinel, biopsy, Right - THERE IS NO EVIDENCE OF CARCINOMA IN 1 OF 1 LYMPH NODE (0/1). 3. Lymph node, sentinel, biopsy, Right - THERE IS NO EVIDENCE OF CARCINOMA IN 1 OF 1 LYMPH NODE (0/1).    - 01/29/2019 Radiation Therapy   Radiation [facility] at [facility] on 01/29/19.    10/27/2018 Cancer Staging   Staging form: Breast, AJCC 8th Edition - Pathologic stage from 10/27/2018: Stage IA (pT1b, pN0, cM0, G1, ER+, PR+, HER2-) - Signed by Truitt Merle, MD on 02/26/2019   03/2019 -  Anti-estrogen oral therapy   Anastrozole '1mg'$  daily starting in 03/2019    07/09/2019 Survivorship  SCP delivered by Cira Rue, NP       CURRENT THERAPY:  Antiestrogen therapy with anastrozole on 03/2019, ultimately switched to exemestane 05/2021 due to worsening joint pain and low  libido (stopped 04/2022).   Tamoxifen starting 06/2022  INTERVAL HISTORY Heather Hayes presents for phone visit for tamoxifen SE check.  She has been on it little more than 3 months, noticed slightly increased hot flashes (3-4 times a week at night) and continuous weight gain.  She will go see weight loss clinic this week.  She initially lost 26 pounds but has gained 15 pounds back since March 2023 which is aggravating.  Joint pain is slightly better.  With CPAP she has more energy especially in the evenings and feels better.  Denies breast concerns, vaginal bleeding, signs of thrombosis, or any other new specific complaints.    Past Medical History:  Diagnosis Date   Anxiety    Arthritis    Back pain    Bursitis    Cancer ([place])    right breast cancer 09-2018   Constipation    GERD (gastroesophageal reflux disease)    some heart burn from Arimidex   History of radiation therapy    completed 01-29-2019   Hyperlipidemia    Joint pain    Personal history of radiation therapy    Rheumatic fever    Swallowing difficulty      Past Surgical History:  Procedure Laterality Date   BREAST LUMPECTOMY Right 10/27/2018   BREAST LUMPECTOMY WITH RADIOACTIVE SEED AND SENTINEL LYMPH NODE BIOPSY Right 10/27/2018   Procedure: RIGHT BREAST LUMPECTOMY WITH RADIOACTIVE SEED AND SENTINEL LYMPH NODE BIOPSY;  Surgeon: Excell Seltzer, MD;  Location: [place];  Service: General;  Laterality: Right;   CERVICAL SPINE SURGERY  2009   COLONOSCOPY     ENDOMETRIAL ABLATION     POLYPECTOMY     TUBAL LIGATION     tube ligation        Outpatient Encounter Medications as of 08/30/2022  Medication Sig   cholecalciferol (VITAMIN D3) 25 MCG (1000 UNIT) tablet 1 tablet Orally Once a day   clonazePAM (KLONOPIN) 0.5 MG tablet Take 0.5 mg by mouth 2 (two) times daily as needed for anxiety.   escitalopram (LEXAPRO) 10 MG tablet Take 1 tablet (10 mg total) by mouth daily.   meloxicam (MOBIC) 15 MG  tablet Take 15 mg by mouth daily.   tamoxifen (NOLVADEX) 20 MG tablet TAKE 1 TABLET BY MOUTH EVERY DAY   No facility-administered encounter medications on file as of 08/30/2022.     There were no vitals filed for this visit. There is no height or weight on file to calculate BMI.   Patient appears well over the phone.  Voice is normal, speech is clear.  Mood/affect appear normal for situation.  No cough or conversational dyspnea.  Labs  No lab data for this visit   Heather Hayes is a 62 y.o. female with    Antiestrogen SE's: Joint pain, fatigue, anxiety, weight gain, low libido -She tried anastrozole first from 03/22/2019 - 05/21/2021, switched to exemestane due to worsening joint pain and low libido -Joint pain improved initially on exemestane but has worsened lately, along with fatigue, weight gain, and anxiety (related to other factors as well and present prior to childbearing) -Due to the negative impact on her quality of life, Exemestane was stopped 04/2022 and she began tamoxifen in 06/2022  -Heather Hayes appears stable.  Tolerating tamoxifen with  slightly increased hot flashes and continuous weight gain, joint pain has improved.  She has more energy which she attributes to using CPAP -Effects are tolerable, she is willing to continue tamoxifen and we will monitor closely   Malignant neoplasm of upper-outer quadrant of right breast, Stage IA, p (T1b,N0,M0), ER/PR+, HER2-, Grade II -Diagnosed in 10/2018, s/p right lumpectomy and radiation.  Given low-grade strongly ER positive disease, Oncotype was not recommended  -She began antiestrogen therapy with anastrozole on 03/2019, ultimately switched to exemestane 05/2021 due to worsening joint pain and low libido -Breast density is category D, mammogram 09/2021 showed a 9 calcification at the lumpectomy site, otherwise negative negative -Continue surveillance  -Next mammogram, DEXA, and in person follow-up in 10/2022   3.  Bone Health  -Her 10/25/17 and 10/08/2020 DEXA scans were normal  -She is aware of AI's can reduce her bone density. -Continue calcium and vitamin D 3-5 times a week and weightbearing exercise -Repeat 10/2022 with mammogram   4.  Genetic testing 2019, negative   5.  Arthritis and bursitis of her hips -Previously stable on AI, controlled with meloxicam -Worsened on anastrozole, improved initially on exemestane then worsened after 1 year -Joint pain slightly better on tamoxifen    PLAN: -Continue tamoxifen -Mammogram and DEXA due 10/2022 -Lab and follow-up with me 10/26/2022 as previously scheduled  Orders Placed This Encounter  Procedures   MM DIAG BREAST TOMO BILATERAL    Standing Status:   Future    Standing Expiration Date:   08/31/2023    Order Specific Question:   Reason for Exam (SYMPTOM  OR DIAGNOSIS REQUIRED)    Answer:   h/o right breast cancer 10/2018, dense breast tissue    Order Specific Question:   Preferred imaging location?    Answer:   [facility]   DG Bone Density    Standing Status:   Future    Standing Expiration Date:   08/30/2023    Order Specific Question:   Reason for Exam (SYMPTOM  OR DIAGNOSIS REQUIRED)    Answer:   screening, on antiestrogen therapy (AI 2020 - 2023, now tamoxifen since 06/2022)    Order Specific Question:   Preferred imaging location?    Answer:   [facility]     I discussed the assessment and treatment plan with the patient. The patient was provided an opportunity to ask questions and all were answered. The patient agreed with the plan and demonstrated an understanding of the instructions.   The patient was advised to call back or seek an in-person evaluation if the symptoms worsen or if the condition fails to improve as anticipated. The total time spent in the appointment was 10 minutes and more than 50% was on counseling, chart review, and coordination of care.   Cira Rue, NP-C 08/30/2022

## 2022-08-31 ENCOUNTER — Encounter (INDEPENDENT_AMBULATORY_CARE_PROVIDER_SITE_OTHER): Payer: Self-pay | Admitting: Family Medicine

## 2022-09-02 ENCOUNTER — Encounter (INDEPENDENT_AMBULATORY_CARE_PROVIDER_SITE_OTHER): Payer: Self-pay | Admitting: Family Medicine

## 2022-09-02 ENCOUNTER — Ambulatory Visit (INDEPENDENT_AMBULATORY_CARE_PROVIDER_SITE_OTHER): Payer: BC Managed Care – PPO | Admitting: Family Medicine

## 2022-09-02 VITALS — BP 126/78 | HR 63 | Temp 98.3°F | Ht 63.0 in

## 2022-09-02 DIAGNOSIS — Z6833 Body mass index (BMI) 33.0-33.9, adult: Secondary | ICD-10-CM

## 2022-09-02 DIAGNOSIS — Z853 Personal history of malignant neoplasm of breast: Secondary | ICD-10-CM

## 2022-09-02 DIAGNOSIS — F329 Major depressive disorder, single episode, unspecified: Secondary | ICD-10-CM

## 2022-09-02 DIAGNOSIS — E669 Obesity, unspecified: Secondary | ICD-10-CM | POA: Diagnosis not present

## 2022-09-02 MED ORDER — ESCITALOPRAM OXALATE 10 MG PO TABS: 10.0000 mg | ORAL_TABLET | Freq: Every day | ORAL | 0 refills | Status: DC

## 2022-09-03 DIAGNOSIS — G4733 Obstructive sleep apnea (adult) (pediatric): Secondary | ICD-10-CM | POA: Diagnosis not present

## 2022-09-12 DIAGNOSIS — G4733 Obstructive sleep apnea (adult) (pediatric): Secondary | ICD-10-CM | POA: Diagnosis not present

## 2022-09-24 NOTE — Progress Notes (Unsigned)
Chief Complaint:   OBESITY Heather Hayes is here to discuss her progress with her obesity treatment plan along with follow-up of her obesity related diagnoses. Heather Hayes is on the Category 2 Plan and states she is following her eating plan approximately 50% of the time. John states she is walking 4,000-6,000 steps per day.  Today's visit was #: 87 Starting weight: 191 lbs Starting date: 06/01/2019 Today's weight: 187 lbs Today's date: 09/02/2022 Total lbs lost to date: 4 Total lbs lost since last in-office visit: +4  Interim History: Net weight loss: 4 lbs in over 3 years of MSWM. Heather Hayes had some travel over the holidays. She is going through some family stressors and doing more stress eating and over snacking. She hasn't added in exercise.  Subjective:   1. History of breast cancer History of breast cancer 2020 Heather Hayes is taking Tamoxifen 20 mg QD. She denies arthralgias. She has some hot flashes. Pt has an oncology follow up in March. DEXA scheduled.  2. Major depressive disorder, remission status unspecified, unspecified whether recurrent Medication/s: Lexapro 100 mg QD Denies need for increased dosage. Family stressors have been worse, but her husband is supportive.  Assessment/Plan:   1. History of breast cancer Continue to work on reducing body fat percentage to reduce risk of future weight related cancers. Noted that Tamoxifen may be causing it to be harder to lose weight.  2. Major depressive disorder, remission status unspecified, unspecified whether recurrent Stress reduction reviewed. Add in regular walking. Refill- escitalopram (LEXAPRO) 10 MG tablet; Take 1 tablet (10 mg total) by mouth daily.  Dispense: 90 tablet; Refill: 0  3. Obesity,current BMI 33.3 Add in walking at home or work 30 minutes 3-4 days a week. Work on stress reduction.  Heather Hayes is currently in the action stage of change. As such, her goal is to continue with weight loss efforts. She has agreed to the Category 2  Plan.   Exercise goals:  Walking 30 minutes 3+ times a week.  Behavioral modification strategies: increasing lean protein intake, increasing water intake, meal planning and cooking strategies, keeping healthy foods in the home, emotional eating strategies, avoiding temptations, and planning for success.  Heather Hayes has agreed to follow-up with our clinic in 3 weeks. She was informed of the importance of frequent follow-up visits to maximize her success with intensive lifestyle modifications for her multiple health conditions.   Objective:   Blood pressure 126/78, pulse 63, temperature 98.3 F (36.8 C), height '5\' 3"'$  (1.6 m), SpO2 99 %. Body mass index is 34.47 kg/m.  General: Cooperative, alert, well developed, in no acute distress. HEENT: Conjunctivae and lids unremarkable. Cardiovascular: Regular rhythm.  Lungs: Normal work of breathing. Neurologic: No focal deficits.   Lab Results  Component Value Date   CREATININE 0.76 04/27/2022   BUN 17 04/27/2022   NA 136 04/27/2022   K 4.4 04/27/2022   CL 103 04/27/2022   CO2 28 04/27/2022   Lab Results  Component Value Date   ALT 12 04/27/2022   AST 15 04/27/2022   ALKPHOS 85 04/27/2022   BILITOT 0.6 04/27/2022   Lab Results  Component Value Date   HGBA1C 5.5 11/09/2021   HGBA1C 5.5 05/26/2021   HGBA1C 5.6 09/29/2020   HGBA1C 5.5 01/22/2020   HGBA1C 5.5 09/05/2019   Lab Results  Component Value Date   INSULIN 6.4 11/09/2021   INSULIN 6.7 05/26/2021   INSULIN 6.5 09/29/2020   INSULIN 8.0 05/20/2020   INSULIN 7.5 01/22/2020  Lab Results  Component Value Date   TSH 1.290 11/09/2021   Lab Results  Component Value Date   CHOL 219 (H) 11/09/2021   HDL 77 11/09/2021   LDLCALC 121 (H) 11/09/2021   TRIG 124 11/09/2021   CHOLHDL 2.8 05/20/2020   Lab Results  Component Value Date   VD25OH 81.0 11/09/2021   VD25OH 86.4 05/26/2021   VD25OH 77.6 09/29/2020   Lab Results  Component Value Date   WBC 5.6 04/27/2022   HGB  13.8 04/27/2022   HCT 41.0 04/27/2022   MCV 93.6 04/27/2022   PLT 269 04/27/2022   Attestation Statements:   Reviewed by clinician on day of visit: allergies, medications, problem list, medical history, surgical history, family history, social history, and previous encounter notes.  I, Kathlene November, BS, CMA, am acting as transcriptionist for Loyal Gambler, DO.   I have reviewed the above documentation for accuracy and completeness, and I agree with the above. -  ***

## 2022-09-29 NOTE — Progress Notes (Unsigned)
  TeleHealth Visit:  This visit was completed with telemedicine (audio/video) technology. Heather Hayes has verbally consented to this TeleHealth visit. The patient is located at home, the provider is located at home. The participants in this visit include the listed provider and patient. The visit was conducted today via MyChart video.  OBESITY Heather Hayes is here to discuss her progress with her obesity treatment plan along with follow-up of her obesity related diagnoses.   Today's visit was # 47 Starting weight: 191 lbs Starting date: 06/01/2019 Weight at last in office visit: 187 lbs on 09/02/22 Total weight loss: 4 lbs at last in office visit on 09/02/22. Today's reported weight: *** lbs No weight reported.  Nutrition Plan: the Category 2 plan.  Current exercise: {exercise types:16438} walking 4,000-6,000 steps per day  Interim History:  ***  Pharmacotherapy: Heather Hayes is on {dwwpharmacotherapy:29109} Adverse side effects: {dwwse:29122} Hunger is {EWCONTROLASSESSMENT:24261}.  Cravings are {EWCONTROLASSESSMENT:24261}.  Assessment/Plan:  1. ***  2. ***  3. ***  {dwwmorbid:29108::"Morbid Obesity"}: Current BMI *** Pharmacotherapy Plan {dwwmed:29123}  {dwwpharmacotherapy:29109} Heather Hayes {CHL AMB IS/IS NOT:210130109} currently in the action stage of change. As such, her goal is to {MWMwtloss#1:210800005}.  She has agreed to {dwwsldiets:29085}.  Exercise goals: {MWM EXERCISE RECS:23473}  Behavioral modification strategies: {dwwslwtlossstrategies:29088}.  Heather Hayes has agreed to follow-up with our clinic in {NUMBER 1-10:22536} weeks.   No orders of the defined types were placed in this encounter.   There are no discontinued medications.   No orders of the defined types were placed in this encounter.     Objective:   VITALS: Per patient if applicable, see vitals. GENERAL: Alert and in no acute distress. CARDIOPULMONARY: No increased WOB. Speaking in clear sentences.  PSYCH: Pleasant and  cooperative. Speech normal rate and rhythm. Affect is appropriate. Insight and judgement are appropriate. Attention is focused, linear, and appropriate.  NEURO: Oriented as arrived to appointment on time with no prompting.   Attestation Statements:   Reviewed by clinician on day of visit: allergies, medications, problem list, medical history, surgical history, family history, social history, and previous encounter notes.  ***(delete if time-based billing not used) Time spent on visit including the items listed below was *** minutes.  -preparing to see the patient (e.g., review of tests, history, previous notes) -obtaining and/or reviewing separately obtained history -counseling and educating the patient/family/caregiver -documenting clinical information in the electronic or other health record -ordering medications, tests, or procedures -independently interpreting results and communicating results to the patient/ family/caregiver -referring and communicating with other health care professionals  -care coordination   This was prepared with the assistance of Presenter, broadcasting.  Occasional wrong-word or sound-a-like substitutions may have occurred due to the inherent limitations of voice recognition software.

## 2022-09-30 ENCOUNTER — Telehealth: Payer: Self-pay

## 2022-09-30 ENCOUNTER — Telehealth (INDEPENDENT_AMBULATORY_CARE_PROVIDER_SITE_OTHER): Payer: BC Managed Care – PPO | Admitting: Family Medicine

## 2022-09-30 ENCOUNTER — Encounter (INDEPENDENT_AMBULATORY_CARE_PROVIDER_SITE_OTHER): Payer: Self-pay | Admitting: Family Medicine

## 2022-09-30 ENCOUNTER — Other Ambulatory Visit (INDEPENDENT_AMBULATORY_CARE_PROVIDER_SITE_OTHER): Payer: Self-pay | Admitting: Family Medicine

## 2022-09-30 DIAGNOSIS — G4733 Obstructive sleep apnea (adult) (pediatric): Secondary | ICD-10-CM | POA: Diagnosis not present

## 2022-09-30 DIAGNOSIS — Z6834 Body mass index (BMI) 34.0-34.9, adult: Secondary | ICD-10-CM

## 2022-09-30 DIAGNOSIS — E669 Obesity, unspecified: Secondary | ICD-10-CM

## 2022-09-30 DIAGNOSIS — F329 Major depressive disorder, single episode, unspecified: Secondary | ICD-10-CM | POA: Diagnosis not present

## 2022-09-30 MED ORDER — WEGOVY 0.25 MG/0.5ML ~~LOC~~ SOAJ
0.2500 mg | SUBCUTANEOUS | 0 refills | Status: DC
Start: 2022-09-30 — End: 2022-09-30

## 2022-09-30 NOTE — Telephone Encounter (Signed)
PA submitted through Cover My Meds for [facility]. Awaiting insurance determination. Key: [id number]

## 2022-10-02 DIAGNOSIS — G4733 Obstructive sleep apnea (adult) (pediatric): Secondary | ICD-10-CM | POA: Diagnosis not present

## 2022-10-04 NOTE — Telephone Encounter (Signed)
Received fax from [place] that Mancel Parsons has been denied. "Your request for coverage was denied because your prescription benefit plan does not cover the requested medication."

## 2022-10-11 DIAGNOSIS — G4733 Obstructive sleep apnea (adult) (pediatric): Secondary | ICD-10-CM | POA: Diagnosis not present

## 2022-10-20 DIAGNOSIS — R7303 Prediabetes: Secondary | ICD-10-CM | POA: Diagnosis not present

## 2022-10-20 DIAGNOSIS — Z0000 Encounter for general adult medical examination without abnormal findings: Secondary | ICD-10-CM | POA: Diagnosis not present

## 2022-10-20 DIAGNOSIS — E7800 Pure hypercholesterolemia, unspecified: Secondary | ICD-10-CM | POA: Diagnosis not present

## 2022-10-20 LAB — TSH: TSH: 1.3 (ref 0.41–5.90)

## 2022-10-20 LAB — LIPID PANEL
Cholesterol: 209 — AB (ref 0–200)
HDL: 3 — AB (ref 35–70)
LDL Cholesterol: 93
Triglycerides: 262 — AB (ref 40–160)

## 2022-10-20 LAB — HEMOGLOBIN A1C: Hemoglobin A1C: 5.7

## 2022-10-20 LAB — BASIC METABOLIC PANEL: Glucose: 86

## 2022-10-25 ENCOUNTER — Ambulatory Visit (INDEPENDENT_AMBULATORY_CARE_PROVIDER_SITE_OTHER): Payer: BC Managed Care – PPO | Admitting: Family Medicine

## 2022-10-25 ENCOUNTER — Encounter: Payer: Self-pay | Admitting: Nurse Practitioner

## 2022-10-25 NOTE — Progress Notes (Signed)
Patient Care Team: Molli Posey, MD as PCP - General (Obstetrics and Gynecology) Mauro Kaufmann, RN as Oncology Nurse Navigator Carlynn Spry, Charlott Holler, RN as Oncology Nurse Navigator Excell Seltzer, MD (Inactive) as Consulting Physician (General Surgery) Truitt Merle, MD as Consulting Physician (Hematology) Gery Pray, MD as Consulting Physician (Radiation Oncology) Alla Feeling, NP as Nurse Practitioner (Nurse Practitioner)   CHIEF COMPLAINT: Follow up right breast cancer   Oncology History Overview Note  Cancer Staging Malignant neoplasm of upper-outer quadrant of right breast in female, estrogen receptor positive [facility]) Staging form: Breast, AJCC 8th Edition - Clinical stage from 10/02/2018: Stage IA (cT1b, cN0, cM0, G2, ER+, PR+, HER2-) - Signed by Truitt Merle, MD on 10/10/2018 - Pathologic stage from 10/27/2018: Stage IA (pT1b, pN0, cM0, G1, ER+, PR+, HER2-) - Signed by Truitt Merle, MD on 02/26/2019     Malignant neoplasm of upper-outer quadrant of right breast in female, estrogen receptor positive ([place])  09/27/2018 Mammogram   [facility] Mammogram 09/27/18  IMPRESSION: 1. Suspicious right breast mass measures 7 x 7 x 6 mm with associated vascularity at the 12 o'clock position 7 cm from the nipple. 2. No suspicious right axillary lymphadenopathy.   10/02/2018 Cancer Staging   Staging form: Breast, AJCC 8th Edition - Clinical stage from 10/02/2018: Stage IA (cT1b, cN0, cM0, G2, ER+, PR+, HER2-) - Signed by Truitt Merle, MD on 10/10/2018   10/02/2018 Initial Biopsy   Diagnosis 10/02/18 Breast, right, needle core biopsy, 12 o'clock - INVASIVE DUCTAL CARCINOMA.   10/02/2018 Receptors her2   Results: IMMUNOHISTOCHEMICAL AND MORPHOMETRIC ANALYSIS PERFORMED MANUALLY The tumor cells are NEGATIVE for Her2 (1+). Estrogen Receptor: 95%, POSITIVE, STRONG STAINING INTENSITY Progesterone Receptor: 95%, POSITIVE, STRONG STAINING INTENSITY Proliferation Marker Ki67: 5%   10/04/2018 Initial  Diagnosis   Malignant neoplasm of upper-outer quadrant of right breast in female, estrogen receptor positive ([place])   10/27/2018 Surgery   RIGHT BREAST LUMPECTOMY WITH RADIOACTIVE SEED AND SENTINEL LYMPH NODE BIOPSY by Dr Excell Seltzer   10/27/2018 Pathology Results   Diagnosis 10/27/18 1. Breast, lumpectomy, Right - INVASIVE DUCTAL CARCINOMA WITH CALCIFICATIONS, GRADE I/III, SPANNING 0.7 CM. - THE SURGICAL RESECTION MARGIN ARE NEGATIVE FOR CARCINOMA. - SEE ONCOLOGY TABLE BELOW. 2. Lymph node, sentinel, biopsy, Right - THERE IS NO EVIDENCE OF CARCINOMA IN 1 OF 1 LYMPH NODE (0/1). 3. Lymph node, sentinel, biopsy, Right - THERE IS NO EVIDENCE OF CARCINOMA IN 1 OF 1 LYMPH NODE (0/1).    - 01/29/2019 Radiation Therapy   Radiation therapy at [facility] on 01/29/19.    10/27/2018 Cancer Staging   Staging form: Breast, AJCC 8th Edition - Pathologic stage from 10/27/2018: Stage IA (pT1b, pN0, cM0, G1, ER+, PR+, HER2-) - Signed by Truitt Merle, MD on 02/26/2019   03/2019 -  Anti-estrogen oral therapy   Anastrozole 1mg  daily starting in 03/2019    07/09/2019 Survivorship   SCP delivered by Cira Rue, NP       CURRENT THERAPY: Antiestrogen therapy with anastrozole on 03/2019, ultimately switched to exemestane 05/2021 due to worsening joint pain and low libido (stopped 04/2022). Changed to Tamoxifen 06/2022   INTERVAL HISTORY  Ms. Hingtgen returns for follow up as scheduled. She changed to Tamoxifen 06/2022. Toxicity check in 08/30/22 at that time doing well with slightly increased hot flashes and weight gain; joint pain and energy were better than on AI. She continues to have moderate to severe hot flashes during day and night and progressive weight gain. This has caused elevated lipid  panel and A1c. She continues to work with medical weight loss management; wellbutrin did not help and insurance denied wegovy.  She would like to change back to anastrozole which she tolerated initially.  The calcification  in the right breast is unchanged.  ROS  All other systems reviewed and normal  Past Medical History:  Diagnosis Date   Anxiety    Arthritis    Back pain    Bursitis    Cancer ([place])    right breast cancer 09-2018   Constipation    GERD (gastroesophageal reflux disease)    some heart burn from Arimidex   History of radiation therapy    completed 01-29-2019   Hyperlipidemia    Joint pain    Personal history of radiation therapy    Rheumatic fever    Swallowing difficulty      Past Surgical History:  Procedure Laterality Date   BREAST LUMPECTOMY Right 10/27/2018   BREAST LUMPECTOMY WITH RADIOACTIVE SEED AND SENTINEL LYMPH NODE BIOPSY Right 10/27/2018   Procedure: RIGHT BREAST LUMPECTOMY WITH RADIOACTIVE SEED AND SENTINEL LYMPH NODE BIOPSY;  Surgeon: Excell Seltzer, MD;  Location: [facility];  Service: General;  Laterality: Right;   CERVICAL SPINE SURGERY  2009   COLONOSCOPY     ENDOMETRIAL ABLATION     POLYPECTOMY     TUBAL LIGATION     tube ligation        Outpatient Encounter Medications as of 10/26/2022  Medication Sig Note   anastrozole (ARIMIDEX) 1 MG tablet Take 1 tablet (1 mg total) by mouth daily.    cholecalciferol (VITAMIN D3) 25 MCG (1000 UNIT) tablet 1 tablet Orally Once a day    clonazePAM (KLONOPIN) 0.5 MG tablet Take 0.5 mg by mouth 2 (two) times daily as needed for anxiety.    escitalopram (LEXAPRO) 10 MG tablet Take 1 tablet (10 mg total) by mouth daily.    meloxicam (MOBIC) 15 MG tablet Take 15 mg by mouth daily.    [DISCONTINUED] tamoxifen (NOLVADEX) 20 MG tablet TAKE 1 TABLET BY MOUTH EVERY DAY 10/26/2022: change back to AI   [DISCONTINUED] Semaglutide-Weight Management (WEGOVY) 0.25 MG/0.5ML SOAJ INJECT 0.25MG  INTO THE SKIN ONE TIME PER WEEK    No facility-administered encounter medications on file as of 10/26/2022.     Today's Vitals   10/26/22 0953  BP: 134/63  Pulse: 63  Resp: 18  Temp: 98.4 F (36.9 C)  TempSrc: Oral  SpO2:  97%  Weight: 191 lb 4.8 oz (86.8 kg)  Height: 5\' 3"  (1.6 m)   Body mass index is 33.89 kg/m.   PHYSICAL EXAM GENERAL:alert, no distress and comfortable SKIN: no rash  EYES: sclera clear NECK: without mass LYMPH:  no palpable cervical or supraclavicular lymphadenopathy  LUNGS: clear with normal breathing effort HEART: regular rate & rhythm, no lower extremity edema ABDOMEN: abdomen soft, non-tender and normal bowel sounds NEURO: alert & oriented x 3 with fluent speech, no focal motor/sensory deficits Breast exam: No nipple discharge or inversion.  S/p right lumpectomy and radiation, incisions completely healed.  Palpable lump in the right central breast is unchanged.  No other palpable mass or nodularity in either breast or axilla that I could appreciate.   CBC    Component Value Date/Time   WBC 4.8 10/26/2022 0919   WBC 6.3 09/06/2007 1520   RBC 4.20 10/26/2022 0919   HGB 13.4 10/26/2022 0919   HGB 13.7 09/29/2020 0813   HCT 39.3 10/26/2022 0919   HCT 40.8  09/29/2020 0813   PLT 230 10/26/2022 0919   PLT 275 09/29/2020 0813   MCV 93.6 10/26/2022 0919   MCV 92 09/29/2020 0813   MCH 31.9 10/26/2022 0919   MCHC 34.1 10/26/2022 0919   RDW 11.9 10/26/2022 0919   RDW 12.2 09/29/2020 0813   LYMPHSABS 1.5 10/26/2022 0919   LYMPHSABS 1.3 09/29/2020 0813   MONOABS 0.3 10/26/2022 0919   EOSABS 0.1 10/26/2022 0919   EOSABS 0.1 09/29/2020 0813   BASOSABS 0.0 10/26/2022 0919   BASOSABS 0.0 09/29/2020 0813     CMP     Component Value Date/Time   NA 138 10/26/2022 0919   NA 142 05/26/2021 0803   K 4.0 10/26/2022 0919   CL 105 10/26/2022 0919   CO2 27 10/26/2022 0919   GLUCOSE 87 10/26/2022 0919   BUN 24 (H) 10/26/2022 0919   BUN 15 05/26/2021 0803   CREATININE 0.84 10/26/2022 0919   CALCIUM 9.4 10/26/2022 0919   PROT 7.1 10/26/2022 0919   PROT 7.2 05/26/2021 0803   ALBUMIN 4.2 10/26/2022 0919   ALBUMIN 4.4 05/26/2021 0803   AST 16 10/26/2022 0919   ALT 13 10/26/2022  0919   ALKPHOS 53 10/26/2022 0919   BILITOT 0.4 10/26/2022 0919   GFRNONAA >60 10/26/2022 0919   GFRAA 106 05/20/2020 0930   GFRAA >60 04/25/2020 0920     ASSESSMENT & PLAN:Leighann R Apicella is a 62 y.o. female with      Malignant neoplasm of upper-outer quadrant of right breast, Stage IA, p (T1b,N0,M0), ER/PR+, HER2-, Grade II -Diagnosed in 10/2018, s/p right lumpectomy and radiation.  Given low-grade strongly ER positive disease, Oncotype was not recommended  -She tried anastrozole first from 03/22/2019 - 05/21/2021, switched to exemestane due to worsening joint pain and low libido -Joint pain improved initially on exemestane but has worsened lately, along with fatigue, weight gain, and anxiety (related to other factors as well and present prior to childbearing) -Due to the negative impact on her quality of life, Exemestane was stopped 04/2022 and she began tamoxifen in 06/2022  -Ms Udell is clinically doing well.  Breast exam is benign (known calc in upper R breast), labs are unremarkable.  No clinical concern for recurrence.  Next mammogram scheduled 11/10/22. -Has worsening moderate to severe hot flashes and progressive weight gain which has contributed to high cholesterol and A1c despite being under medical weight loss management. She attributes to tamoxifen and would like to stop -Discussed further antiestrogen therapy, such as letrozole which is the only thing she has not tried yet, vs going back to a previous AI, or stopping altogether. She would like to go back to anastrozole (tolerated the longest) to complete 1 more year -She knows to message me with unmanageable side effects or other issues -Follow-up in 6 months, or sooner if needed  2. Bone Health  -Her 10/25/17 and 10/08/2020 DEXA scans were normal  -She is aware of AI's can reduce her bone density. -Continue calcium and vitamin D 3-5 times a week and weightbearing exercise -Repeat 10/2022 with mammogram   3.  Genetic testing 2019,  negative   4.  Arthritis and bursitis of her hips -Previously stable on AI, controlled with meloxicam -Worsened on anastrozole, improved initially on exemestane then worsened after 1 year -Joint pain slightly better on tamoxifen   PLAN: -Labs reviewed -Stop tamoxifen when supply runs out, restart anastrozole 2-4 weeks later -Continue breast cancer surveillance -Mammogram 11/04/22, DEXA 04/08/23 -F/up in 6 months, or sooner if  needed   All questions were answered. The patient knows to call the clinic with any problems, questions or concerns. No barriers to learning were detected. I spent 20 minutes counseling the patient face to face. The total time spent in the appointment was 30 minutes and more than 50% was on counseling, review of test results, and coordination of care.   Cira Rue, NP-C 10/26/2022

## 2022-10-26 ENCOUNTER — Encounter: Payer: Self-pay | Admitting: Nurse Practitioner

## 2022-10-26 ENCOUNTER — Inpatient Hospital Stay: Payer: BC Managed Care – PPO | Attending: Nurse Practitioner

## 2022-10-26 ENCOUNTER — Inpatient Hospital Stay (HOSPITAL_BASED_OUTPATIENT_CLINIC_OR_DEPARTMENT_OTHER): Payer: BC Managed Care – PPO | Admitting: Nurse Practitioner

## 2022-10-26 VITALS — BP 134/63 | HR 63 | Temp 98.4°F | Resp 18 | Ht 63.0 in | Wt 191.3 lb

## 2022-10-26 DIAGNOSIS — E7800 Pure hypercholesterolemia, unspecified: Secondary | ICD-10-CM | POA: Insufficient documentation

## 2022-10-26 DIAGNOSIS — N951 Menopausal and female climacteric states: Secondary | ICD-10-CM | POA: Diagnosis not present

## 2022-10-26 DIAGNOSIS — C50411 Malignant neoplasm of upper-outer quadrant of right female breast: Secondary | ICD-10-CM | POA: Diagnosis not present

## 2022-10-26 DIAGNOSIS — Z79811 Long term (current) use of aromatase inhibitors: Secondary | ICD-10-CM | POA: Insufficient documentation

## 2022-10-26 DIAGNOSIS — Z170 Estrogen receptor positive status [ER+]: Secondary | ICD-10-CM | POA: Diagnosis not present

## 2022-10-26 LAB — CBC WITH DIFFERENTIAL (CANCER CENTER ONLY)
Abs Immature Granulocytes: 0.01 10*3/uL (ref 0.00–0.07)
Basophils Absolute: 0 10*3/uL (ref 0.0–0.1)
Basophils Relative: 1 %
Eosinophils Absolute: 0.1 10*3/uL (ref 0.0–0.5)
Eosinophils Relative: 2 %
HCT: 39.3 % (ref 36.0–46.0)
Hemoglobin: 13.4 g/dL (ref 12.0–15.0)
Immature Granulocytes: 0 %
Lymphocytes Relative: 31 %
Lymphs Abs: 1.5 10*3/uL (ref 0.7–4.0)
MCH: 31.9 pg (ref 26.0–34.0)
MCHC: 34.1 g/dL (ref 30.0–36.0)
MCV: 93.6 fL (ref 80.0–100.0)
Monocytes Absolute: 0.3 10*3/uL (ref 0.1–1.0)
Monocytes Relative: 7 %
Neutro Abs: 2.8 10*3/uL (ref 1.7–7.7)
Neutrophils Relative %: 59 %
Platelet Count: 230 10*3/uL (ref 150–400)
RBC: 4.2 MIL/uL (ref 3.87–5.11)
RDW: 11.9 % (ref 11.5–15.5)
WBC Count: 4.8 10*3/uL (ref 4.0–10.5)
nRBC: 0 % (ref 0.0–0.2)

## 2022-10-26 LAB — CMP (CANCER CENTER ONLY)
ALT: 13 U/L (ref 0–44)
AST: 16 U/L (ref 15–41)
Albumin: 4.2 g/dL (ref 3.5–5.0)
Alkaline Phosphatase: 53 U/L (ref 38–126)
Anion gap: 6 (ref 5–15)
BUN: 24 mg/dL — ABNORMAL HIGH (ref 8–23)
CO2: 27 mmol/L (ref 22–32)
Calcium: 9.4 mg/dL (ref 8.9–10.3)
Chloride: 105 mmol/L (ref 98–111)
Creatinine: 0.84 mg/dL (ref 0.44–1.00)
GFR, Estimated: >60 mL/min (ref >60)
Glucose, Bld: 87 mg/dL (ref 70–99)
Potassium: 4 mmol/L (ref 3.5–5.1)
Sodium: 138 mmol/L (ref 135–145)
Total Bilirubin: 0.4 mg/dL (ref 0.3–1.2)
Total Protein: 7.1 g/dL (ref 6.5–8.1)

## 2022-10-26 MED ORDER — ANASTROZOLE 1 MG PO TABS: 1.0000 mg | ORAL_TABLET | Freq: Every day | ORAL | 3 refills | Status: DC

## 2022-11-01 ENCOUNTER — Ambulatory Visit (INDEPENDENT_AMBULATORY_CARE_PROVIDER_SITE_OTHER): Payer: BC Managed Care – PPO | Admitting: Physician Assistant

## 2022-11-01 ENCOUNTER — Encounter (INDEPENDENT_AMBULATORY_CARE_PROVIDER_SITE_OTHER): Payer: Self-pay | Admitting: Physician Assistant

## 2022-11-01 VITALS — BP 132/85 | HR 59 | Temp 98.7°F | Ht 63.0 in | Wt 187.0 lb

## 2022-11-01 DIAGNOSIS — Z853 Personal history of malignant neoplasm of breast: Secondary | ICD-10-CM | POA: Diagnosis not present

## 2022-11-01 DIAGNOSIS — E88819 Insulin resistance, unspecified: Secondary | ICD-10-CM

## 2022-11-01 DIAGNOSIS — Z6833 Body mass index (BMI) 33.0-33.9, adult: Secondary | ICD-10-CM

## 2022-11-01 DIAGNOSIS — E669 Obesity, unspecified: Secondary | ICD-10-CM | POA: Insufficient documentation

## 2022-11-01 MED ORDER — METFORMIN HCL 500 MG PO TABS: 500.0000 mg | ORAL_TABLET | Freq: Every day | ORAL | 0 refills | Status: DC

## 2022-11-01 NOTE — Assessment & Plan Note (Signed)
She felt tamoxifen was making it more difficult to loose weight and weight gain begin when changed from anastrozole to tamoxifen. Started Tamoxifen 06/2022. Discussed with oncology at follow up 10/26/22 and switched back to anastrozole . Plan:  Will monitor with oncology. Continue nutrition plan to promote weight loss.

## 2022-11-01 NOTE — Assessment & Plan Note (Signed)
Insulin Resistance Last fasting insulin was 9.2. A1c was 5.7. Done with PCP Dr Shirline Frees at [facility].  Polyphagia:Yes Medication(s): None She has been on metformin in the past for insulin resistance/prediabetes and A1c is back into the prediabetic range.  We discussed restarting metformin at this time including possible risks, benefits and side effects and she is agreeable to start at this time.  She is working on Camera operator to decrease simple carbohydrates, increase lean proteins and exercise to promote weight loss, improve glycemic control and prevent progression to Type 2 diabetes.   Lab Results  Component Value Date   HGBA1C 5.5 11/09/2021   HGBA1C 5.5 05/26/2021   HGBA1C 5.6 09/29/2020   HGBA1C 5.5 01/22/2020   HGBA1C 5.5 09/05/2019   Lab Results  Component Value Date   INSULIN 6.4 11/09/2021   INSULIN 6.7 05/26/2021   INSULIN 6.5 09/29/2020   INSULIN 8.0 05/20/2020   INSULIN 7.5 01/22/2020    Plan: Start Metformin 500 mg once daily breakfast Continue working on nutrition plan to decrease simple carbohydrates, increase lean proteins and exercise to promote weight loss, improve glycemic control and prevent progression to Type 2 diabetes.  Increase protein intake. Increase fiber intake.

## 2022-11-01 NOTE — Progress Notes (Signed)
Office: [phone]  /  Fax: [phone]  WEIGHT SUMMARY AND BIOMETRICS  Vitals Temp: 98.7 F (37.1 C) BP: 132/85 Pulse Rate: (!) 59 SpO2: 98 %   Anthropometric Measurements Height: 5\' 3"  (1.6 m) Weight: 187 lb (84.8 kg) BMI (Calculated): 33.13 Weight at Last Visit: 187 lb Weight Lost Since Last Visit: 0 lb Weight Gained Since Last Visit: 0 lb Starting Weight: 191 lb Total Weight Loss (lbs): 4 lb (1.814 kg)   Body Composition  Body Fat %: 38.7 % Fat Mass (lbs): 72.4 lbs Muscle Mass (lbs): 109 lbs Total Body Water (lbs): 75.6 lbs Visceral Fat Rating : 11   Other Clinical Data Fasting: No Labs: No Today's Visit #: 4 Starting Date: 05/15/19     HPI  Chief Complaint: OBESITY  Heather Hayes is here to discuss her progress with her obesity treatment plan. She is on the the Category 2 Plan and states she is following her eating plan approximately 65-70 % of the time. She states she is exercising/walking 5-6,000 steps daily 6 times per week.   Interval History:  Since last office visit she has maintained weight loss. She enjoyed [employer], but getting back on track with plan. She had been on tamoxifen and oncology has switched back to anastrozole.She feels she did not gain weight / hot flashes were not as bad on anastrozole. She has birthday and anniversary celebrations coming up and is going to see son and family at beach . We discussed some travel strategies and better snacking choices.    Pharmacotherapy: Unable to obtain [facility] as no insurance coverage.   PHYSICAL EXAM:  Blood pressure 132/85, pulse (!) 59, temperature 98.7 F (37.1 C), height 5\' 3"  (1.6 m), weight 187 lb (84.8 kg), SpO2 98 %. Body mass index is 33.13 kg/m.  General: She is overweight, cooperative, alert, well developed, and in no acute distress. PSYCH: Has normal mood, affect and thought process.   Cardiovascular: Regular rhythm Lungs: Normal breathing effort, no conversational  dyspnea. Neuro: no focal deficit  DIAGNOSTIC DATA REVIEWED:  BMET    Component Value Date/Time   NA 138 10/26/2022 0919   NA 142 05/26/2021 0803   K 4.0 10/26/2022 0919   CL 105 10/26/2022 0919   CO2 27 10/26/2022 0919   GLUCOSE 87 10/26/2022 0919   BUN 24 (H) 10/26/2022 0919   BUN 15 05/26/2021 0803   CREATININE 0.84 10/26/2022 0919   CALCIUM 9.4 10/26/2022 0919   GFRNONAA >60 10/26/2022 0919   GFRAA 106 05/20/2020 0930   GFRAA >60 04/25/2020 0920   Lab Results  Component Value Date   HGBA1C 5.5 11/09/2021   HGBA1C 5.4 05/15/2019   Lab Results  Component Value Date   INSULIN 6.4 11/09/2021   INSULIN 14.9 05/15/2019   Lab Results  Component Value Date   TSH 1.290 11/09/2021   CBC    Component Value Date/Time   WBC 4.8 10/26/2022 0919   WBC 6.3 09/06/2007 1520   RBC 4.20 10/26/2022 0919   HGB 13.4 10/26/2022 0919   HGB 13.7 09/29/2020 0813   HCT 39.3 10/26/2022 0919   HCT 40.8 09/29/2020 0813   PLT 230 10/26/2022 0919   PLT 275 09/29/2020 0813   MCV 93.6 10/26/2022 0919   MCV 92 09/29/2020 0813   MCH 31.9 10/26/2022 0919   MCHC 34.1 10/26/2022 0919   RDW 11.9 10/26/2022 0919   RDW 12.2 09/29/2020 0813   Iron Studies No results found for: "IRON", "TIBC", "FERRITIN", "IRONPCTSAT" Lipid Panel  Component Value Date/Time   CHOL 219 (H) 11/09/2021 0832   TRIG 124 11/09/2021 0832   HDL 77 11/09/2021 0832   CHOLHDL 2.8 05/20/2020 0930   LDLCALC 121 (H) 11/09/2021 0832   Hepatic Function Panel     Component Value Date/Time   PROT 7.1 10/26/2022 0919   PROT 7.2 05/26/2021 0803   ALBUMIN 4.2 10/26/2022 0919   ALBUMIN 4.4 05/26/2021 0803   AST 16 10/26/2022 0919   ALT 13 10/26/2022 0919   ALKPHOS 53 10/26/2022 0919   BILITOT 0.4 10/26/2022 0919      Component Value Date/Time   TSH 1.290 11/09/2021 0832   Nutritional Lab Results  Component Value Date   VD25OH 81.0 11/09/2021   VD25OH 86.4 05/26/2021   VD25OH 77.6 09/29/2020    ASSOCIATED  CONDITIONS ADDRESSED TODAY  ASSESSMENT AND PLAN  Problem List Items Addressed This Visit     Insulin resistance - Primary    Insulin Resistance Last fasting insulin was 9.2. A1c was 5.7. Done with PCP Dr Shirline Frees at [place].  Polyphagia:Yes Medication(s): None She has been on metformin in the past for insulin resistance/prediabetes and A1c is back into the prediabetic range.  We discussed restarting metformin at this time including possible risks, benefits and side effects and she is agreeable to start at this time.  She is working on Camera operator to decrease simple carbohydrates, increase lean proteins and exercise to promote weight loss, improve glycemic control and prevent progression to Type 2 diabetes.   Lab Results  Component Value Date   HGBA1C 5.5 11/09/2021   HGBA1C 5.5 05/26/2021   HGBA1C 5.6 09/29/2020   HGBA1C 5.5 01/22/2020   HGBA1C 5.5 09/05/2019   Lab Results  Component Value Date   INSULIN 6.4 11/09/2021   INSULIN 6.7 05/26/2021   INSULIN 6.5 09/29/2020   INSULIN 8.0 05/20/2020   INSULIN 7.5 01/22/2020   Plan: Start Metformin 500 mg once daily breakfast Continue working on nutrition plan to decrease simple carbohydrates, increase lean proteins and exercise to promote weight loss, improve glycemic control and prevent progression to Type 2 diabetes.  Increase protein intake. Increase fiber intake.        Relevant Medications   metFORMIN (GLUCOPHAGE) 500 MG tablet   History of breast cancer    She felt tamoxifen was making it more difficult to loose weight and weight gain begin when changed from anastrozole to tamoxifen. Started Tamoxifen 06/2022. Discussed with oncology at follow up 10/26/22 and switched back to anastrozole . Plan:  Will monitor with oncology. Continue nutrition plan to promote weight loss.       Generalized obesity   Relevant Medications   metFORMIN (GLUCOPHAGE) 500 MG tablet   BMI 33.0-33.9,adult Current BMI 33.2       TREATMENT PLAN FOR OBESITY:  Recommended Dietary Goals  Heather Hayes is currently in the action stage of change. As such, her goal is to continue weight management plan. She has agreed to the Category 2 Plan.  Behavioral Intervention  We discussed the following Behavioral Modification Strategies today: increasing lean protein intake, decreasing simple carbohydrates , increasing vegetables, increasing water intake, avoiding temptations and identifying enticing environmental cues, and better snacking choices.  Additional resources provided today: NA  Recommended Physical Activity Goals  Annaya has been advised to work up to 150 minutes of moderate intensity aerobic activity a week and strengthening exercises 2-3 times per week for cardiovascular health, weight loss maintenance and preservation of muscle mass.   She has agreed  to Continue current level of physical activity    Pharmacotherapy We discussed various medication options to help Mccall with her weight loss efforts and we both agreed to start metformin for insulin resistance/prediabetes.    Return in about 3 weeks (around 11/22/2022).Marland Kitchen She was informed of the importance of frequent follow up visits to maximize her success with intensive lifestyle modifications for her multiple health conditions.   ATTESTASTION STATEMENTS:  Reviewed by clinician on day of visit: allergies, medications, problem list, medical history, surgical history, family history, social history, and previous encounter notes.   I have personally spent 33 minutes total time today in preparation, patient care, nutritional counseling and documentation for this visit, including the following: review of clinical lab tests; review of medical tests/procedures/services.      Matheu Ploeger, PA-C

## 2022-11-10 ENCOUNTER — Other Ambulatory Visit: Payer: BC Managed Care – PPO

## 2022-11-10 ENCOUNTER — Ambulatory Visit
Admission: RE | Admit: 2022-11-10 | Discharge: 2022-11-10 | Disposition: A | Discharge status: Home / Self Care | Payer: BC Managed Care – PPO | Source: Ambulatory Visit | Attending: Nurse Practitioner | Admitting: Nurse Practitioner

## 2022-11-10 DIAGNOSIS — Z1231 Encounter for screening mammogram for malignant neoplasm of breast: Secondary | ICD-10-CM | POA: Diagnosis not present

## 2022-11-10 DIAGNOSIS — C50411 Malignant neoplasm of upper-outer quadrant of right female breast: Secondary | ICD-10-CM

## 2022-11-10 HISTORY — DX: Malignant neoplasm of unspecified site of unspecified female breast: C50.919

## 2022-11-11 DIAGNOSIS — G4733 Obstructive sleep apnea (adult) (pediatric): Secondary | ICD-10-CM | POA: Diagnosis not present

## 2022-11-18 NOTE — Progress Notes (Signed)
TeleHealth Visit:  This visit was completed with telemedicine (audio/video) technology. Heather Hayes has verbally consented to this TeleHealth visit. The patient is located at home, the provider is located at home. The participants in this visit include the listed provider and patient. The visit was conducted today via MyChart video.  OBESITY Heather Hayes is here to discuss her progress with her obesity treatment plan along with follow-up of her obesity related diagnoses.   Today's visit was # 47 Starting weight: 191 lbs Starting date: 06/01/19 Weight at last in office visit: 187 lbs on 11/01/22 Total weight loss: 4 lbs at last in office visit on 11/01/22. Today's reported weight (11/22/22): none reported  Nutrition Plan: the Category 2 plan   Current exercise:  exercising/walking 5-6,000 steps daily 6 times per week   Interim History:  She has had some celebrations recently and been off plan. She got back on plan today. Reports her weight is the same as at her last office visit.  She has no upcoming travel plans and feels she will be able to focus on the plan until her next appointment on May 13.  Assessment/Plan:  1. Prediabetes Last A1c was 5.7. Metformin started last office visit.  She denies any side effects. Lab Results  Component Value Date   HGBA1C 5.7 10/20/2022   HGBA1C 5.5 11/09/2021   HGBA1C 5.5 05/26/2021   HGBA1C 5.6 09/29/2020   HGBA1C 5.5 01/22/2020   Lab Results  Component Value Date   INSULIN 6.4 11/09/2021   INSULIN 6.7 05/26/2021   INSULIN 6.5 09/29/2020   INSULIN 8.0 05/20/2020   INSULIN 7.5 01/22/2020    Plan: Continue and refill Metformin 500 mg once daily breakfast   2. Other depression/emotional eating Admits to stress/emotional eating recently. She denies depression but does have some anxiety due to work situation.  Has been on both bupropion and topiramate in the past for emotional eating but did not benefit from either medication. Currently this is  moderately controlled. Overall mood is stable. Medication(s): Lexapro 10 mg daily.  Plan: Continue Lexapro 10 mg daily  3. Generalized Obesity: Current BMI 33 Heather Hayes is currently in the action stage of change. As such, her goal is to continue with weight loss efforts.  She has agreed to the Category 2 plan.  Exercise goals: Encouraged her to try to get in some brisk walks on the weekend.  Behavioral modification strategies: increasing lean protein intake, decreasing simple carbohydrates , and planning for success.  Heather Hayes has agreed to follow-up with our clinic in 4 weeks.   No orders of the defined types were placed in this encounter.   Medications Discontinued During This Encounter  Medication Reason   escitalopram (LEXAPRO) 10 MG tablet Reorder   metFORMIN (GLUCOPHAGE) 500 MG tablet Reorder     Meds ordered this encounter  Medications   metFORMIN (GLUCOPHAGE) 500 MG tablet    Sig: Take 1 tablet (500 mg total) by mouth daily.    Dispense:  90 tablet    Refill:  0    Order Specific Question:   Supervising Provider    Answer:   Glennis Brink [[id number]]   escitalopram (LEXAPRO) 10 MG tablet    Sig: Take 1 tablet (10 mg total) by mouth daily.    Dispense:  90 tablet    Refill:  0    Order Specific Question:   Supervising Provider    Answer:   Glennis Brink [[id number]]      Objective:   VITALS: Per  patient if applicable, see vitals. GENERAL: Alert and in no acute distress. CARDIOPULMONARY: No increased WOB. Speaking in clear sentences.  PSYCH: Pleasant and cooperative. Speech normal rate and rhythm. Affect is appropriate. Insight and judgement are appropriate. Attention is focused, linear, and appropriate.  NEURO: Oriented as arrived to appointment on time with no prompting.   Attestation Statements:   Reviewed by clinician on day of visit: allergies, medications, problem list, medical history, surgical history, family history, social history, and previous encounter  notes.   This was prepared with the assistance of Engineer, civil (consulting).  Occasional wrong-word or sound-a-like substitutions may have occurred due to the inherent limitations of voice recognition software.

## 2022-11-22 ENCOUNTER — Telehealth (INDEPENDENT_AMBULATORY_CARE_PROVIDER_SITE_OTHER): Payer: BC Managed Care – PPO | Admitting: Family Medicine

## 2022-11-22 ENCOUNTER — Encounter (INDEPENDENT_AMBULATORY_CARE_PROVIDER_SITE_OTHER): Payer: Self-pay | Admitting: Family Medicine

## 2022-11-22 DIAGNOSIS — E669 Obesity, unspecified: Secondary | ICD-10-CM

## 2022-11-22 DIAGNOSIS — Z6833 Body mass index (BMI) 33.0-33.9, adult: Secondary | ICD-10-CM

## 2022-11-22 DIAGNOSIS — F3289 Other specified depressive episodes: Secondary | ICD-10-CM | POA: Diagnosis not present

## 2022-11-22 DIAGNOSIS — E88819 Insulin resistance, unspecified: Secondary | ICD-10-CM

## 2022-11-22 DIAGNOSIS — F32A Depression, unspecified: Secondary | ICD-10-CM | POA: Insufficient documentation

## 2022-11-22 DIAGNOSIS — R7303 Prediabetes: Secondary | ICD-10-CM

## 2022-11-22 MED ORDER — METFORMIN HCL 500 MG PO TABS: 500.0000 mg | ORAL_TABLET | Freq: Every day | ORAL | 0 refills | Status: DC

## 2022-11-22 MED ORDER — ESCITALOPRAM OXALATE 10 MG PO TABS: 10.0000 mg | ORAL_TABLET | Freq: Every day | ORAL | 0 refills | Status: DC

## 2022-11-30 DIAGNOSIS — S83241A Other tear of medial meniscus, current injury, right knee, initial encounter: Secondary | ICD-10-CM | POA: Diagnosis not present

## 2022-12-11 DIAGNOSIS — G4733 Obstructive sleep apnea (adult) (pediatric): Secondary | ICD-10-CM | POA: Diagnosis not present

## 2022-12-13 ENCOUNTER — Encounter (INDEPENDENT_AMBULATORY_CARE_PROVIDER_SITE_OTHER): Payer: Self-pay | Admitting: Physician Assistant

## 2022-12-13 ENCOUNTER — Ambulatory Visit (INDEPENDENT_AMBULATORY_CARE_PROVIDER_SITE_OTHER): Payer: BC Managed Care – PPO | Admitting: Physician Assistant

## 2022-12-13 VITALS — BP 122/80 | HR 62 | Temp 97.9°F | Ht 63.0 in | Wt 189.0 lb

## 2022-12-13 DIAGNOSIS — F3289 Other specified depressive episodes: Secondary | ICD-10-CM

## 2022-12-13 DIAGNOSIS — Z6833 Body mass index (BMI) 33.0-33.9, adult: Secondary | ICD-10-CM

## 2022-12-13 DIAGNOSIS — E669 Obesity, unspecified: Secondary | ICD-10-CM | POA: Diagnosis not present

## 2022-12-13 DIAGNOSIS — E88819 Insulin resistance, unspecified: Secondary | ICD-10-CM | POA: Diagnosis not present

## 2022-12-13 NOTE — Progress Notes (Signed)
.smr  Office: [phone]  /  Fax: [phone]  WEIGHT SUMMARY AND BIOMETRICS  Vitals Temp: 97.9 F (36.6 C) BP: 122/80 Pulse Rate: 62 SpO2: 99 %   Anthropometric Measurements Height: 5\' 3"  (1.6 m) Weight: 189 lb (85.7 kg) BMI (Calculated): 33.49 Weight at Last Visit: 187 lb Weight Lost Since Last Visit: 0 Weight Gained Since Last Visit: 2 lb Starting Weight: 191 lb   Body Composition  Body Fat %: 39.1 % Fat Mass (lbs): 74.2 lbs Muscle Mass (lbs): 109.8 lbs Total Body Water (lbs): 74.8 lbs Visceral Fat Rating : 11   Other Clinical Data Labs: no Today's Visit #: 39 Starting Date: 05/15/19     HPI  Chief Complaint: OBESITY  Heather Hayes is here to discuss her progress with her obesity treatment plan. She is on the the Category 2 Plan and states she is following her eating plan approximately 65 % of the time. She states she is exercising/some walking 45 minutes 2 times per week.   Interval History:  Since last office visit she is up 2 lbs. She injured her right knee and had to have a steroid injection. Seems to be improving some now.  Hunger/appetite-moderate control overall. Reports some increased cravings from afternoon on. Mostly for sweets. Discussed some better snacking options.  Cravings- for sweets from afternoons on.  Exercise-was able to get back to walking this past weekend/ did some yard work/gardening.  Hydration-adequate.    Pharmacotherapy: Metformin for insulin resistance/prediabetes. Discussed switching metformin to evening meal to help more with evening cravings. Will also consider increase to twice daily dosing. No GI side effects with metformin.   TREATMENT PLAN FOR OBESITY:  Recommended Dietary Goals  Heather Hayes is currently in the action stage of change. As such, her goal is to continue weight management plan. She has agreed to the Category 2 Plan.  Behavioral Intervention  We discussed the following Behavioral Modification Strategies today:  increasing lean protein intake, decreasing simple carbohydrates , increasing vegetables, increasing lower glycemic fruits, increasing fiber rich foods, increasing water intake, continue to practice mindfulness when eating, planning for success, and better snacking choices.  Additional resources provided today: NA  Recommended Physical Activity Goals  Heather Hayes has been advised to work up to 150 minutes of moderate intensity aerobic activity a week and strengthening exercises 2-3 times per week for cardiovascular health, weight loss maintenance and preservation of muscle mass.   She has agreed to Continue current level of physical activity    Pharmacotherapy We discussed various medication options to help Heather Hayes with her weight loss efforts and we both agreed to continue metformin for prediabetes, but change timing of dose to evenings to help with hunger later during the day. .    Return in about 4 weeks (around 01/10/2023), or VV with Heather Hayes.Marland Kitchen She was informed of the importance of frequent follow up visits to maximize her success with intensive lifestyle modifications for her multiple health conditions.  PHYSICAL EXAM:  Blood pressure 122/80, pulse 62, temperature 97.9 F (36.6 C), height 5\' 3"  (1.6 m), weight 189 lb (85.7 kg), SpO2 99 %. Body mass index is 33.48 kg/m.  General: She is overweight, cooperative, alert, well developed, and in no acute distress. PSYCH: Has normal mood, affect and thought process.   Cardiovascular: HR 60's regular.  Lungs: Normal breathing effort, no conversational dyspnea. Neuro: No focal deficits.   DIAGNOSTIC DATA REVIEWED:  BMET    Component Value Date/Time   NA 138 10/26/2022 0919   NA 142 05/26/2021  0803   K 4.0 10/26/2022 0919   CL 105 10/26/2022 0919   CO2 27 10/26/2022 0919   GLUCOSE 87 10/26/2022 0919   BUN 24 (H) 10/26/2022 0919   BUN 15 05/26/2021 0803   CREATININE 0.84 10/26/2022 0919   CALCIUM 9.4 10/26/2022 0919   GFRNONAA >60 10/26/2022  0919   GFRAA 106 05/20/2020 0930   GFRAA >60 04/25/2020 0920   Lab Results  Component Value Date   HGBA1C 5.7 10/20/2022   HGBA1C 5.4 05/15/2019   Lab Results  Component Value Date   INSULIN 6.4 11/09/2021   INSULIN 14.9 05/15/2019   Lab Results  Component Value Date   TSH 1.30 10/20/2022   CBC    Component Value Date/Time   WBC 4.8 10/26/2022 0919   WBC 6.3 09/06/2007 1520   RBC 4.20 10/26/2022 0919   HGB 13.4 10/26/2022 0919   HGB 13.7 09/29/2020 0813   HCT 39.3 10/26/2022 0919   HCT 40.8 09/29/2020 0813   PLT 230 10/26/2022 0919   PLT 275 09/29/2020 0813   MCV 93.6 10/26/2022 0919   MCV 92 09/29/2020 0813   MCH 31.9 10/26/2022 0919   MCHC 34.1 10/26/2022 0919   RDW 11.9 10/26/2022 0919   RDW 12.2 09/29/2020 0813   Iron Studies No results found for: "IRON", "TIBC", "FERRITIN", "IRONPCTSAT" Lipid Panel     Component Value Date/Time   CHOL 209 (A) 10/20/2022 0000   CHOL 219 (H) 11/09/2021 0832   TRIG 262 (A) 10/20/2022 0000   HDL 3 (A) 10/20/2022 0000   HDL 77 11/09/2021 0832   CHOLHDL 2.8 05/20/2020 0930   LDLCALC 93 10/20/2022 0000   LDLCALC 121 (H) 11/09/2021 0832   Hepatic Function Panel     Component Value Date/Time   PROT 7.1 10/26/2022 0919   PROT 7.2 05/26/2021 0803   ALBUMIN 4.2 10/26/2022 0919   ALBUMIN 4.4 05/26/2021 0803   AST 16 10/26/2022 0919   ALT 13 10/26/2022 0919   ALKPHOS 53 10/26/2022 0919   BILITOT 0.4 10/26/2022 0919      Component Value Date/Time   TSH 1.30 10/20/2022 0000   TSH 1.290 11/09/2021 0832   Nutritional Lab Results  Component Value Date   VD25OH 81.0 11/09/2021   VD25OH 86.4 05/26/2021   VD25OH 77.6 09/29/2020    ASSOCIATED CONDITIONS ADDRESSED TODAY  ASSESSMENT AND PLAN  Problem List Items Addressed This Visit     Insulin resistance - Primary   Generalized obesity   BMI 33.0-33.9,adult Current BMI 33.2   Depression   Insulin Resistance Last fasting insulin was 6.4. A1c was  5.7. Polyphagia:No Medication(s): Metformin 500 mg once daily breakfast No GI side effects with metformin. She is working on Engineer, technical sales to decrease simple carbohydrates, increase lean proteins and exercise to promote weight loss, improve glycemic control and prevent progression to Type 2 diabetes.   Lab Results  Component Value Date   HGBA1C 5.7 10/20/2022   HGBA1C 5.5 11/09/2021   HGBA1C 5.5 05/26/2021   HGBA1C 5.6 09/29/2020   HGBA1C 5.5 01/22/2020   Lab Results  Component Value Date   INSULIN 6.4 11/09/2021   INSULIN 6.7 05/26/2021   INSULIN 6.5 09/29/2020   INSULIN 8.0 05/20/2020   INSULIN 7.5 01/22/2020    Plan: Continue  metformin 500 mg daily but change to evening meal  Increase protein intake. Increase fiber intake.  Continue working on nutrition plan to decrease simple carbohydrates, increase lean proteins and exercise to promote weight loss, improve glycemic  control and prevent progression to Type 2 diabetes.   Other depression/emotional eating Heather Hayes has had issues with stress/emotional eating. Currently this is moderately controlled. Overall mood is stable. Anxiety related to work.  Medication(s): Other: Lexapro 10 mg daily. No side effects with Lexapro.   Plan: Continue Other: Lexapro 10 mg daily.  Continue to work on emotional eating strategies.   ATTESTASTION STATEMENTS:  Reviewed by clinician on day of visit: allergies, medications, problem list, medical history, surgical history, family history, social history, and previous encounter notes.   I have personally spent 40 minutes total time today in preparation, patient care, nutritional counseling and documentation for this visit, including the following: review of clinical lab tests; review of medical tests/procedures/services.      Heather Ricco, PA-C

## 2022-12-30 DIAGNOSIS — G4733 Obstructive sleep apnea (adult) (pediatric): Secondary | ICD-10-CM | POA: Diagnosis not present

## 2023-01-11 ENCOUNTER — Telehealth (INDEPENDENT_AMBULATORY_CARE_PROVIDER_SITE_OTHER): Payer: BC Managed Care – PPO | Admitting: Family Medicine

## 2023-01-11 DIAGNOSIS — G4733 Obstructive sleep apnea (adult) (pediatric): Secondary | ICD-10-CM | POA: Diagnosis not present

## 2023-01-11 NOTE — Progress Notes (Signed)
TeleHealth Visit:  This visit was completed with telemedicine (audio/video) technology. Heather Hayes has verbally consented to this TeleHealth visit. The patient is located at home, the provider is located at home. The participants in this visit include the listed provider and patient. The visit was conducted today via MyChart video.  OBESITY Heather Hayes is here to discuss her progress with her obesity treatment plan along with follow-up of her obesity related diagnoses.   Today's visit was # 48 Starting weight: 191 lbs Starting date: 05/15/19 Weight at last in office visit: 189 lbs on 12/13/22 Total weight loss: 2 lbs at last in office visit on 12/13/22. Today's reported weight (01/12/23):  191 lbs  Nutrition Plan: the Category 2 plan  Current exercise:  walking 15-30 minutes 3-4 times per week.   Interim History:  She had a recent steroid shot in her knee for a meniscus tear- notes increased hunger. She struggles with hunger/cravings mid afternoon and sometimes after dinner. She is on plan and breakfast and lunch.  Assessment/Plan:  1. Prediabetes Last A1c was 5.7 (in the prediabetic range for the first time). Medication(s): Metformin 500 mg once daily breakfast Polyphagia:Yes She believes she may have coverage for Rybelsus. Unknown if she has coverage without having DM. Lab Results  Component Value Date   HGBA1C 5.7 10/20/2022   HGBA1C 5.5 11/09/2021   HGBA1C 5.5 05/26/2021   HGBA1C 5.6 09/29/2020   HGBA1C 5.5 01/22/2020   Lab Results  Component Value Date   INSULIN 6.4 11/09/2021   INSULIN 6.7 05/26/2021   INSULIN 6.5 09/29/2020   INSULIN 8.0 05/20/2020   INSULIN 7.5 01/22/2020    Plan: Start Rybelsus 3 mg daily before breakfast Stop metformin if she starts Rybelsus.  2. Obstructive Sleep Apnea Heather Hayes has a diagnosis of sleep apnea. She reports that she is using a CPAP regularly. She feels better rested since starting it a few months ago.   Plan: Continue CPAP  therapy.  3. Generalized Obesity: Current BMI 33  Heather Hayes is currently in the action stage of change. As such, her goal is to continue with weight loss efforts.  She has agreed to the Category 2 plan.  Exercise goals: as is  Behavioral modification strategies: increasing lean protein intake and decreasing simple carbohydrates .  Heather Hayes has agreed to follow-up with our clinic in 4 weeks.  No orders of the defined types were placed in this encounter.   There are no discontinued medications.   Meds ordered this encounter  Medications   Semaglutide (RYBELSUS) 3 MG TABS    Sig: Take 1 tablet (3 mg total) by mouth daily before breakfast.    Dispense:  30 tablet    Refill:  0    Order Specific Question:   Supervising Provider    Answer:   Glennis Brink [[id number]]      Objective:   VITALS: Per patient if applicable, see vitals. GENERAL: Alert and in no acute distress. CARDIOPULMONARY: No increased WOB. Speaking in clear sentences.  PSYCH: Pleasant and cooperative. Speech normal rate and rhythm. Affect is appropriate. Insight and judgement are appropriate. Attention is focused, linear, and appropriate.  NEURO: Oriented as arrived to appointment on time with no prompting.   Attestation Statements:   Reviewed by clinician on day of visit: allergies, medications, problem list, medical history, surgical history, family history, social history, and previous encounter notes.  This was prepared with the assistance of Engineer, civil (consulting).  Occasional wrong-word or sound-a-like substitutions may have occurred due to the  inherent limitations of voice recognition software.

## 2023-01-12 ENCOUNTER — Encounter (INDEPENDENT_AMBULATORY_CARE_PROVIDER_SITE_OTHER): Payer: Self-pay | Admitting: Family Medicine

## 2023-01-12 ENCOUNTER — Telehealth (INDEPENDENT_AMBULATORY_CARE_PROVIDER_SITE_OTHER): Payer: BC Managed Care – PPO | Admitting: Family Medicine

## 2023-01-12 ENCOUNTER — Telehealth: Payer: Self-pay

## 2023-01-12 DIAGNOSIS — R7303 Prediabetes: Secondary | ICD-10-CM

## 2023-01-12 DIAGNOSIS — E669 Obesity, unspecified: Secondary | ICD-10-CM | POA: Diagnosis not present

## 2023-01-12 DIAGNOSIS — Z6833 Body mass index (BMI) 33.0-33.9, adult: Secondary | ICD-10-CM

## 2023-01-12 DIAGNOSIS — G4733 Obstructive sleep apnea (adult) (pediatric): Secondary | ICD-10-CM | POA: Diagnosis not present

## 2023-01-12 MED ORDER — RYBELSUS 3 MG PO TABS: 3.0000 mg | ORAL_TABLET | Freq: Every day | ORAL | 0 refills | Status: DC

## 2023-01-12 NOTE — Telephone Encounter (Signed)
PA submitted through Cover My Meds for Rybelsus. Awaiting insurance determination. Key: Heather Hayes

## 2023-01-13 ENCOUNTER — Other Ambulatory Visit: Payer: Self-pay | Admitting: Nurse Practitioner

## 2023-01-17 NOTE — Telephone Encounter (Signed)
PA for Rybelsus came back with the following message: Your PA has been resolved, no additional PA is required. For further inquiries please contact the number on the back of the member prescription card.

## 2023-01-26 DIAGNOSIS — G4733 Obstructive sleep apnea (adult) (pediatric): Secondary | ICD-10-CM | POA: Diagnosis not present

## 2023-01-30 DIAGNOSIS — G4733 Obstructive sleep apnea (adult) (pediatric): Secondary | ICD-10-CM | POA: Diagnosis not present

## 2023-02-10 DIAGNOSIS — G4733 Obstructive sleep apnea (adult) (pediatric): Secondary | ICD-10-CM | POA: Diagnosis not present

## 2023-02-14 ENCOUNTER — Ambulatory Visit (INDEPENDENT_AMBULATORY_CARE_PROVIDER_SITE_OTHER): Payer: BC Managed Care – PPO | Admitting: Physician Assistant

## 2023-02-20 ENCOUNTER — Other Ambulatory Visit (INDEPENDENT_AMBULATORY_CARE_PROVIDER_SITE_OTHER): Payer: Self-pay | Admitting: Family Medicine

## 2023-02-20 DIAGNOSIS — F3289 Other specified depressive episodes: Secondary | ICD-10-CM

## 2023-03-01 DIAGNOSIS — G4733 Obstructive sleep apnea (adult) (pediatric): Secondary | ICD-10-CM | POA: Diagnosis not present

## 2023-03-10 NOTE — Progress Notes (Signed)
TeleHealth Visit:  This visit was completed with telemedicine (audio/video) technology. Heather Hayes has verbally consented to this TeleHealth visit. The patient is located at home, the provider is located at home. The participants in this visit include the listed provider and patient. The visit was conducted today via MyChart video.  OBESITY Heather Hayes is here to discuss her progress with her obesity treatment plan along with follow-up of her obesity related diagnoses.   Today's visit was # 49 Starting weight: 191 lbs Starting date: 05/15/19 Weight at last in office visit: 189 lbs on 12/13/22 Total weight loss: 2 lbs at last in office visit on 12/13/22. Reported weight last virtual visit on 01/12/2023: 191 pounds. Today's reported weight (03/14/23):  195 lbs  Nutrition Plan: the Category 2 plan   Current exercise:  none  Interim History:  She was on vacation last week at [facility]. She says she "ate terrible." She has gained about 4 pounds. She fell at work the first of June and hurt her right knee. She sees ortho this week. She has swelling in right lower leg as well as soreness.  X-ray shows a possible hematoma. She is recommitting to the meal plan starting today.  She has done well so far and has her lunch and dinner planned. Assessment/Plan:  1. Prediabetes Last A1c was 5.7.  Medication(s): Metformin 500 mg once daily breakfast and Rybelsus 3 mg daily before breakfast. We started Rybelsus her last visit.  She had mild nausea with it which subsided.  However she has been off of it for 3 weeks because she ran out of medication. She is not sure if it helped with her appetite.  She is taking correctly-before breakfast with a small amount of water and waiting 30 minutes to eat. Lab Results  Component Value Date   HGBA1C 5.7 10/20/2022   HGBA1C 5.5 11/09/2021   HGBA1C 5.5 05/26/2021   HGBA1C 5.6 09/29/2020   HGBA1C 5.5 01/22/2020   Lab Results  Component Value Date   INSULIN 6.4  11/09/2021   INSULIN 6.7 05/26/2021   INSULIN 6.5 09/29/2020   INSULIN 8.0 05/20/2020   INSULIN 7.5 01/22/2020    Plan: Continue Metformin 500 mg once daily breakfast and Rybelsus 3 mg daily before breakfast I will refill the Rybelsus.  Next visit will increase to 7 mg if she is tolerating it okay.   2. Other depression/emotional eating Heather Hayes has had issues with stress/emotional eating. Currently this is poorly controlled. Overall mood is stable. Medication(s): Escitalopram 10 mg daily  Plan: Continue and refill escitalopram 10 mg daily.   3. Generalized Obesity: Current BMI 33  Heather Hayes is currently in the action stage of change. As such, her goal is to continue with weight loss efforts.  She has agreed to the Category 2 plan.  Exercise goals: No exercise has been prescribed at this time.  Behavioral modification strategies: increasing lean protein intake, decreasing simple carbohydrates , and planning for success.  Heather Hayes has agreed to follow-up with our clinic in 3 weeks.  No orders of the defined types were placed in this encounter.   Medications Discontinued During This Encounter  Medication Reason   Semaglutide (RYBELSUS) 3 MG TABS Duplicate   escitalopram (LEXAPRO) 10 MG tablet Reorder     Meds ordered this encounter  Medications   Semaglutide (RYBELSUS) 3 MG TABS    Sig: Take 1 tablet (3 mg total) by mouth daily.    Dispense:  30 tablet    Refill:  0  Order Specific Question:   Supervising Provider    Answer:   Glennis Brink [[id number]]   escitalopram (LEXAPRO) 10 MG tablet    Sig: Take 1 tablet (10 mg total) by mouth daily.    Dispense:  90 tablet    Refill:  0    Order Specific Question:   Supervising Provider    Answer:   Glennis Brink [[id number]]      Objective:   VITALS: Per patient if applicable, see vitals. GENERAL: Alert and in no acute distress. CARDIOPULMONARY: No increased WOB. Speaking in clear sentences.  PSYCH: Pleasant and cooperative. Speech  normal rate and rhythm. Affect is appropriate. Insight and judgement are appropriate. Attention is focused, linear, and appropriate.  NEURO: Oriented as arrived to appointment on time with no prompting.   Attestation Statements:   Reviewed by clinician on day of visit: allergies, medications, problem list, medical history, surgical history, family history, social history, and previous encounter notes.  This was prepared with the assistance of Engineer, civil (consulting).  Occasional wrong-word or sound-a-like substitutions may have occurred due to the inherent limitations of voice recognition software.

## 2023-03-13 DIAGNOSIS — G4733 Obstructive sleep apnea (adult) (pediatric): Secondary | ICD-10-CM | POA: Diagnosis not present

## 2023-03-14 ENCOUNTER — Telehealth (INDEPENDENT_AMBULATORY_CARE_PROVIDER_SITE_OTHER): Payer: BC Managed Care – PPO | Admitting: Family Medicine

## 2023-03-14 ENCOUNTER — Encounter (INDEPENDENT_AMBULATORY_CARE_PROVIDER_SITE_OTHER): Payer: Self-pay | Admitting: Family Medicine

## 2023-03-14 DIAGNOSIS — Z6833 Body mass index (BMI) 33.0-33.9, adult: Secondary | ICD-10-CM

## 2023-03-14 DIAGNOSIS — R7303 Prediabetes: Secondary | ICD-10-CM | POA: Insufficient documentation

## 2023-03-14 DIAGNOSIS — E669 Obesity, unspecified: Secondary | ICD-10-CM

## 2023-03-14 DIAGNOSIS — F3289 Other specified depressive episodes: Secondary | ICD-10-CM | POA: Diagnosis not present

## 2023-03-14 MED ORDER — RYBELSUS 3 MG PO TABS
3.0000 mg | ORAL_TABLET | Freq: Every day | ORAL | 0 refills | Status: DC
Start: 2023-03-14 — End: 2023-04-06

## 2023-03-14 MED ORDER — ESCITALOPRAM OXALATE 10 MG PO TABS
10.0000 mg | ORAL_TABLET | Freq: Every day | ORAL | 0 refills | Status: AC
Start: 2023-03-14 — End: 2023-06-12

## 2023-04-06 ENCOUNTER — Encounter (INDEPENDENT_AMBULATORY_CARE_PROVIDER_SITE_OTHER): Payer: Self-pay | Admitting: Family Medicine

## 2023-04-06 ENCOUNTER — Telehealth (INDEPENDENT_AMBULATORY_CARE_PROVIDER_SITE_OTHER): Payer: BC Managed Care – PPO | Admitting: Family Medicine

## 2023-04-06 DIAGNOSIS — E669 Obesity, unspecified: Secondary | ICD-10-CM

## 2023-04-06 DIAGNOSIS — Z6833 Body mass index (BMI) 33.0-33.9, adult: Secondary | ICD-10-CM

## 2023-04-06 DIAGNOSIS — R7303 Prediabetes: Secondary | ICD-10-CM | POA: Diagnosis not present

## 2023-04-06 DIAGNOSIS — G4733 Obstructive sleep apnea (adult) (pediatric): Secondary | ICD-10-CM | POA: Diagnosis not present

## 2023-04-06 MED ORDER — OZEMPIC (0.25 OR 0.5 MG/DOSE) 2 MG/3ML ~~LOC~~ SOPN
0.2500 mg | PEN_INJECTOR | SUBCUTANEOUS | 0 refills | Status: AC
Start: 2023-04-06 — End: ?

## 2023-04-06 NOTE — Progress Notes (Signed)
TeleHealth Visit:  This visit was completed with telemedicine (audio/video) technology. Heather Hayes has verbally consented to this TeleHealth visit. The patient is located at home, the provider is located at home. The participants in this visit include the listed provider and patient. The visit was conducted today via MyChart video.  OBESITY Heather Hayes is here to discuss her progress with her obesity treatment plan along with follow-up of her obesity related diagnoses.   Today's visit was # 50 Starting weight: 191 lbs Starting date: 06/01/19 Weight at last in office visit: 189 lbs on 12/13/22 Total weight loss: 2 lbs at last in office visit on 12/13/22. Reported weight (03/14/23):  195 lbs Today's reported weight (04/06/23):  193 lbs  Nutrition Plan: the Category 2 plan - 65-70% adherence.  Current exercise:  walks her dogs.  Interim History:  She is having an MRI of her knee today. She injured her knee at work the first of June. She still has knee pain. She is avoiding stairs. She is following her meal plan a bit better. She has lost a few lbs.  has helped with her appetite and cravings She feels that the Rybelsus has helped with her appetite and cravings somewhat.  Assessment/Plan:  1. Prediabetes Last A1c was 5.7.  Medication(s): Rybelsus 3 mg daily before breakfast Polyphagia:Yes. Notes the Rybesus is helping a little bit.  Since her insurance is covering Rybelsus we discussed trying to get Ozempic covered and she is on board with this.  Her daughter who is also a patient at our clinic recently started taking [facility] which seems to be helping her. Lab Results  Component Value Date   HGBA1C 5.7 10/20/2022   HGBA1C 5.5 11/09/2021   HGBA1C 5.5 05/26/2021   HGBA1C 5.6 09/29/2020   HGBA1C 5.5 01/22/2020   Lab Results  Component Value Date   INSULIN 6.4 11/09/2021   INSULIN 6.7 05/26/2021   INSULIN 6.5 09/29/2020   INSULIN 8.0 05/20/2020   INSULIN 7.5 01/22/2020    Plan: New  prescription for Ozempic 0.25 mg weekly. December denies personal or family history of thyroid cancer, history of pancreatitis, or current cholelithiasis. Keviona was informed of the most common side effects (nausea, constipation, diarrhea). If unable to get coverage we will increase her dose of Rybelsus to 7 mg.  2. Obstructive Sleep Apnea Trinh has a diagnosis of sleep apnea. She reports that she is using  CPAP regularly. She feels better rested.  Plan: Continue CPAP therapy.  3. Generalized Obesity: Current BMI 33 Jaron is currently in the action stage of change. As such, her goal is to continue with weight loss efforts.  She has agreed to the Category 2 plan.  Exercise goals:  as is  Behavioral modification strategies: increasing lean protein intake, decreasing simple carbohydrates , and planning for success.  Sekina has agreed to follow-up with our clinic in 4 weeks.  No orders of the defined types were placed in this encounter.   Medications Discontinued During This Encounter  Medication Reason   metFORMIN (GLUCOPHAGE) 500 MG tablet Discontinued by provider   Semaglutide (RYBELSUS) 3 MG TABS Duplicate     Meds ordered this encounter  Medications   Semaglutide,0.25 or 0.5MG /DOS, (OZEMPIC, 0.25 OR 0.5 MG/DOSE,) 2 MG/3ML SOPN    Sig: Inject 0.25 mg into the skin once a week.    Dispense:  3 mL    Refill:  0    Order Specific Question:   Supervising Provider    Answer:   Glennis Brink [  [id number]]      Objective:   VITALS: Per patient if applicable, see vitals. GENERAL: Alert and in no acute distress. CARDIOPULMONARY: No increased WOB. Speaking in clear sentences.  PSYCH: Pleasant and cooperative. Speech normal rate and rhythm. Affect is appropriate. Insight and judgement are appropriate. Attention is focused, linear, and appropriate.  NEURO: Oriented as arrived to appointment on time with no prompting.   Attestation Statements:   Reviewed by clinician on day of visit: allergies,  medications, problem list, medical history, surgical history, family history, social history, and previous encounter notes.  This was prepared with the assistance of Engineer, civil (consulting).  Occasional wrong-word or sound-a-like substitutions may have occurred due to the inherent limitations of voice recognition software.

## 2023-04-08 ENCOUNTER — Ambulatory Visit
Admission: RE | Admit: 2023-04-08 | Discharge: 2023-04-08 | Disposition: A | Discharge status: Home / Self Care | Payer: BC Managed Care – PPO | Source: Ambulatory Visit | Attending: Nurse Practitioner | Admitting: Nurse Practitioner

## 2023-04-08 DIAGNOSIS — M8588 Other specified disorders of bone density and structure, other site: Secondary | ICD-10-CM | POA: Diagnosis not present

## 2023-04-08 DIAGNOSIS — N958 Other specified menopausal and perimenopausal disorders: Secondary | ICD-10-CM | POA: Diagnosis not present

## 2023-04-08 DIAGNOSIS — C50411 Malignant neoplasm of upper-outer quadrant of right female breast: Secondary | ICD-10-CM

## 2023-04-08 DIAGNOSIS — E349 Endocrine disorder, unspecified: Secondary | ICD-10-CM | POA: Diagnosis not present

## 2023-04-13 DIAGNOSIS — G4733 Obstructive sleep apnea (adult) (pediatric): Secondary | ICD-10-CM | POA: Diagnosis not present

## 2023-04-27 NOTE — Progress Notes (Unsigned)
Patient Care Team: Noberto Retort, MD as PCP - General (Family Medicine) Pershing Proud, RN as Oncology Nurse Navigator Donnelly Angelica, RN as Oncology Nurse Navigator Glenna Fellows, MD (Inactive) as Consulting Physician (General Surgery) Malachy Mood, MD as Consulting Physician (Hematology) Antony Blackbird, MD as Consulting Physician (Radiation Oncology) Pollyann Samples, NP as Nurse Practitioner (Nurse Practitioner)   CHIEF COMPLAINT: Follow up right breast cancer   Oncology History Overview Note  Cancer Staging Malignant neoplasm of upper-outer quadrant of right breast in female, estrogen receptor positive [facility]) Staging form: Breast, AJCC 8th Edition - Clinical stage from 10/02/2018: Stage IA (cT1b, cN0, cM0, G2, ER+, PR+, HER2-) - Signed by Malachy Mood, MD on 10/10/2018 - Pathologic stage from 10/27/2018: Stage IA (pT1b, pN0, cM0, G1, ER+, PR+, HER2-) - Signed by Malachy Mood, MD on 02/26/2019     Malignant neoplasm of upper-outer quadrant of right breast in female, estrogen receptor positive (HCC)  09/27/2018 Mammogram   [facility] Mammogram 09/27/18  IMPRESSION: 1. Suspicious right breast mass measures 7 x 7 x 6 mm with associated vascularity at the 12 o'clock position 7 cm from the nipple. 2. No suspicious right axillary lymphadenopathy.   10/02/2018 Cancer Staging   Staging form: Breast, AJCC 8th Edition - Clinical stage from 10/02/2018: Stage IA (cT1b, cN0, cM0, G2, ER+, PR+, HER2-) - Signed by Malachy Mood, MD on 10/10/2018   10/02/2018 Initial Biopsy   Diagnosis 10/02/18 Breast, right, needle core biopsy, 12 o'clock - INVASIVE DUCTAL CARCINOMA.   10/02/2018 Receptors her2   Results: IMMUNOHISTOCHEMICAL AND MORPHOMETRIC ANALYSIS PERFORMED MANUALLY The tumor cells are NEGATIVE for Her2 (1+). Estrogen Receptor: 95%, POSITIVE, STRONG STAINING INTENSITY Progesterone Receptor: 95%, POSITIVE, STRONG STAINING INTENSITY Proliferation Marker Ki67: 5%   10/04/2018 Initial Diagnosis    Malignant neoplasm of upper-outer quadrant of right breast in female, estrogen receptor positive (HCC)   10/27/2018 Surgery   RIGHT BREAST LUMPECTOMY WITH RADIOACTIVE SEED AND SENTINEL LYMPH NODE BIOPSY by Dr Johna Sheriff   10/27/2018 Pathology Results   Diagnosis 10/27/18 1. Breast, lumpectomy, Right - INVASIVE DUCTAL CARCINOMA WITH CALCIFICATIONS, GRADE I/III, SPANNING 0.7 CM. - THE SURGICAL RESECTION MARGIN ARE NEGATIVE FOR CARCINOMA. - SEE ONCOLOGY TABLE BELOW. 2. Lymph node, sentinel, biopsy, Right - THERE IS NO EVIDENCE OF CARCINOMA IN 1 OF 1 LYMPH NODE (0/1). 3. Lymph node, sentinel, biopsy, Right - THERE IS NO EVIDENCE OF CARCINOMA IN 1 OF 1 LYMPH NODE (0/1).    - 01/29/2019 Radiation Therapy   Radiation therapy at [facility] on 01/29/19.    10/27/2018 Cancer Staging   Staging form: Breast, AJCC 8th Edition - Pathologic stage from 10/27/2018: Stage IA (pT1b, pN0, cM0, G1, ER+, PR+, HER2-) - Signed by Malachy Mood, MD on 02/26/2019   03/2019 -  Anti-estrogen oral therapy   Anastrozole 1mg  daily starting in 03/2019    07/09/2019 Survivorship   SCP delivered by Santiago Glad, NP       CURRENT THERAPY: Antiestrogen therapy with anastrozole on 03/2019, ultimately switched to exemestane 05/2021 due to worsening joint pain and low libido (stopped 04/2022). Changed to Tamoxifen 06/2022 and back to Anastrozole 10/2022  INTERVAL HISTORY Ms. Robitaille returns for follow up as scheduled. Last seen by me 10/26/22 and changed tamoxifen back to anastrozole. Tolerating well but just doesn't like any of the anti-estrogens she has tried. Denies breast concerns such as new lump/mass, nipple discharge/inversion, or skin change.  She fell at work (shoe stuck on floor lost footing) and tore right  meniscus. Had 1 episode of leg swelling, and a couple times toes have "drawn in" like a cramp that eventually resolves. One of those times she had pain in the left calf.  Today she has no complaints of paresthesias, leg  swelling, calf pain, cough, chest pain, or dyspnea.  ROS  All other systems reviewed and negative  Past Medical History:  Diagnosis Date   Anxiety    Arthritis    Back pain    Breast cancer (HCC)    Bursitis    Cancer (HCC)    right breast cancer 09-2018   Constipation    GERD (gastroesophageal reflux disease)    some heart burn from Arimidex   History of radiation therapy    completed 01-29-2019   Hyperlipidemia    Joint pain    Personal history of radiation therapy    Rheumatic fever    Swallowing difficulty      Past Surgical History:  Procedure Laterality Date   BREAST LUMPECTOMY Right 10/27/2018   BREAST LUMPECTOMY WITH RADIOACTIVE SEED AND SENTINEL LYMPH NODE BIOPSY Right 10/27/2018   Procedure: RIGHT BREAST LUMPECTOMY WITH RADIOACTIVE SEED AND SENTINEL LYMPH NODE BIOPSY;  Surgeon: Glenna Fellows, MD;  Location: [facility];  Service: General;  Laterality: Right;   CERVICAL SPINE SURGERY  2009   COLONOSCOPY     ENDOMETRIAL ABLATION     POLYPECTOMY     TUBAL LIGATION     tube ligation        Outpatient Encounter Medications as of 04/28/2023  Medication Sig   anastrozole (ARIMIDEX) 1 MG tablet TAKE 1 TABLET BY MOUTH EVERY DAY   cholecalciferol (VITAMIN D3) 25 MCG (1000 UNIT) tablet 1 tablet Orally Once a day   clonazePAM (KLONOPIN) 0.5 MG tablet Take 0.5 mg by mouth 2 (two) times daily as needed for anxiety.   escitalopram (LEXAPRO) 10 MG tablet Take 1 tablet (10 mg total) by mouth daily.   meloxicam (MOBIC) 15 MG tablet Take 15 mg by mouth daily.   Semaglutide,0.25 or 0.5MG /DOS, (OZEMPIC, 0.25 OR 0.5 MG/DOSE,) 2 MG/3ML SOPN Inject 0.25 mg into the skin once a week.   No facility-administered encounter medications on file as of 04/28/2023.     Today's Vitals   04/28/23 1004  BP: (!) 145/81  Pulse: 62  Resp: 17  Temp: 98.1 F (36.7 C)  TempSrc: Oral  SpO2: 100%  Weight: 194 lb 6.4 oz (88.2 kg)   Body mass index is 34.44 kg/m.   PHYSICAL  EXAM GENERAL:alert, no distress and comfortable SKIN: no rash  EYES: sclera clear NECK: without mass LYMPH:  no palpable cervical or supraclavicular lymphadenopathy  LUNGS:  breathing effort HEART:  no lower extremity edema ABDOMEN: abdomen soft, non-tender and normal bowel sounds NEURO: alert & oriented x 3 with fluent speech, no focal motor/sensory deficits Breast exam: No nipple discharge or inversion.  S/p right lumpectomy and radiation, incisions completely healed.  Palpable subq nodule in the right upper/12:00 breast is unchanged.  No other palpable mass or nodularity in either breast or axilla that I could appreciate   CBC    Component Value Date/Time   WBC 5.8 04/28/2023 0932   WBC 6.3 09/06/2007 1520   RBC 4.14 04/28/2023 0932   HGB 12.7 04/28/2023 0932   HGB 13.7 09/29/2020 0813   HCT 39.0 04/28/2023 0932   HCT 40.8 09/29/2020 0813   PLT 249 04/28/2023 0932   PLT 275 09/29/2020 0813   MCV 94.2 04/28/2023 0932  MCV 92 09/29/2020 0813   MCH 30.7 04/28/2023 0932   MCHC 32.6 04/28/2023 0932   RDW 12.8 04/28/2023 0932   RDW 12.2 09/29/2020 0813   LYMPHSABS 1.6 04/28/2023 0932   LYMPHSABS 1.3 09/29/2020 0813   MONOABS 0.4 04/28/2023 0932   EOSABS 0.1 04/28/2023 0932   EOSABS 0.1 09/29/2020 0813   BASOSABS 0.1 04/28/2023 0932   BASOSABS 0.0 09/29/2020 0813     CMP     Component Value Date/Time   NA 138 04/28/2023 0932   NA 142 05/26/2021 0803   K 4.2 04/28/2023 0932   CL 105 04/28/2023 0932   CO2 27 04/28/2023 0932   GLUCOSE 82 04/28/2023 0932   BUN 18 04/28/2023 0932   BUN 15 05/26/2021 0803   CREATININE 0.75 04/28/2023 0932   CALCIUM 9.4 04/28/2023 0932   PROT 7.2 04/28/2023 0932   PROT 7.2 05/26/2021 0803   ALBUMIN 4.0 04/28/2023 0932   ALBUMIN 4.4 05/26/2021 0803   AST 15 04/28/2023 0932   ALT 15 04/28/2023 0932   ALKPHOS 78 04/28/2023 0932   BILITOT 0.6 04/28/2023 0932   GFRNONAA >60 04/28/2023 0932   GFRAA 106 05/20/2020 0930   GFRAA >60  04/25/2020 0920     ASSESSMENT & PLAN:Fedora R Deheer is a 62 y.o. female with      Malignant neoplasm of upper-outer quadrant of right breast, Stage IA, p (T1b,N0,M0), ER/PR+, HER2-, Grade II -Diagnosed in 10/2018, s/p right lumpectomy and radiation.  Given low-grade strongly ER positive disease, Oncotype was not recommended  -She tried anastrozole first from 03/22/2019 - 05/21/2021, switched to exemestane due to worsening joint pain and low libido -Joint pain improved initially on exemestane but has worsened lately, along with fatigue, weight gain, and anxiety (related to other factors as well and present prior to childbearing) -Due to the negative impact on her quality of life, Exemestane was stopped 04/2022 and she began tamoxifen in 06/2022  -Due to worsening hot flashes, weight gain and metabolic changes, she went back to Anastrozole 10/2022.  Tolerating well -Ms. Rumford is clinically doing well.  Exam is benign, labs are unremarkable.  Last mammogram 11/2022 is negative.  Overall no clinical concern for recurrence -Continue breast cancer surveillance and anastrozole, to complete 5 years of AI 03/2024 -Follow-up in 6 months, or sooner if needed   2. Bone Health  -Her 10/25/17 and 10/08/2020 DEXA scans were normal  -She is aware of AI's can reduce her bone density. -Continue calcium and vitamin D 3-5 times a week and weightbearing exercise -I reviewed her recent DEXA 04/08/2023 which showed mild osteopenia, lowest T-score -1.2, with low FRAX score.  I recommend to continue calcium, vitamin D, and weightbearing exercise  3.  Genetic testing 2019, negative   4.  Arthritis and bursitis of her hips -Previously stable on AI, controlled with meloxicam -Worsened on anastrozole, improved initially on exemestane then worsened after 1 year -Joint pain slightly better on tamoxifen then worsened, back on anastrozole -She fell at work, tore right meniscus. Following with ortho. I recommend to discuss  changes of the distal extremities with them       PLAN: -Last mammogram, recent DEXA, and today's labs reviewed -Continue breast cancer surveillance and anastrozole (complete 03/2024) -Follow-up in 6 months, or sooner if needed  Orders Placed This Encounter  Procedures   MM 3D SCREENING MAMMOGRAM BILATERAL BREAST    Standing Status:   Future    Standing Expiration Date:   04/27/2024    Order  Specific Question:   Reason for Exam (SYMPTOM  OR DIAGNOSIS REQUIRED)    Answer:   R breast cancer 2020    Order Specific Question:   Preferred imaging location?    Answer:   [facility]      All questions were answered. The patient knows to call the clinic with any problems, questions or concerns. No barriers to learning were detected. I spent 20 minutes counseling the patient face to face. The total time spent in the appointment was 30 minutes and more than 50% was on counseling, review of test results, and coordination of care.   Santiago Glad, NP-C 04/28/2023

## 2023-04-28 ENCOUNTER — Inpatient Hospital Stay: Payer: BC Managed Care – PPO | Attending: Hematology

## 2023-04-28 ENCOUNTER — Other Ambulatory Visit: Payer: Self-pay

## 2023-04-28 ENCOUNTER — Inpatient Hospital Stay (HOSPITAL_BASED_OUTPATIENT_CLINIC_OR_DEPARTMENT_OTHER): Payer: BC Managed Care – PPO | Admitting: Nurse Practitioner

## 2023-04-28 ENCOUNTER — Encounter: Payer: Self-pay | Admitting: Nurse Practitioner

## 2023-04-28 VITALS — BP 145/81 | HR 62 | Temp 98.1°F | Resp 17 | Wt 194.4 lb

## 2023-04-28 DIAGNOSIS — Z170 Estrogen receptor positive status [ER+]: Secondary | ICD-10-CM | POA: Insufficient documentation

## 2023-04-28 DIAGNOSIS — C50411 Malignant neoplasm of upper-outer quadrant of right female breast: Secondary | ICD-10-CM | POA: Insufficient documentation

## 2023-04-28 DIAGNOSIS — Z79811 Long term (current) use of aromatase inhibitors: Secondary | ICD-10-CM | POA: Insufficient documentation

## 2023-04-28 LAB — CBC WITH DIFFERENTIAL (CANCER CENTER ONLY)
Abs Immature Granulocytes: 0.02 10*3/uL (ref 0.00–0.07)
Basophils Absolute: 0.1 10*3/uL (ref 0.0–0.1)
Basophils Relative: 1 %
Eosinophils Absolute: 0.1 10*3/uL (ref 0.0–0.5)
Eosinophils Relative: 2 %
HCT: 39 % (ref 36.0–46.0)
Hemoglobin: 12.7 g/dL (ref 12.0–15.0)
Immature Granulocytes: 0 %
Lymphocytes Relative: 28 %
Lymphs Abs: 1.6 10*3/uL (ref 0.7–4.0)
MCH: 30.7 pg (ref 26.0–34.0)
MCHC: 32.6 g/dL (ref 30.0–36.0)
MCV: 94.2 fL (ref 80.0–100.0)
Monocytes Absolute: 0.4 10*3/uL (ref 0.1–1.0)
Monocytes Relative: 7 %
Neutro Abs: 3.6 10*3/uL (ref 1.7–7.7)
Neutrophils Relative %: 62 %
Platelet Count: 249 10*3/uL (ref 150–400)
RBC: 4.14 MIL/uL (ref 3.87–5.11)
RDW: 12.8 % (ref 11.5–15.5)
WBC Count: 5.8 10*3/uL (ref 4.0–10.5)
nRBC: 0 % (ref 0.0–0.2)

## 2023-04-28 LAB — CMP (CANCER CENTER ONLY)
ALT: 15 U/L (ref 0–44)
AST: 15 U/L (ref 15–41)
Albumin: 4 g/dL (ref 3.5–5.0)
Alkaline Phosphatase: 78 U/L (ref 38–126)
Anion gap: 6 (ref 5–15)
BUN: 18 mg/dL (ref 8–23)
CO2: 27 mmol/L (ref 22–32)
Calcium: 9.4 mg/dL (ref 8.9–10.3)
Chloride: 105 mmol/L (ref 98–111)
Creatinine: 0.75 mg/dL (ref 0.44–1.00)
GFR, Estimated: >60 mL/min (ref >60)
Glucose, Bld: 82 mg/dL (ref 70–99)
Potassium: 4.2 mmol/L (ref 3.5–5.1)
Sodium: 138 mmol/L (ref 135–145)
Total Bilirubin: 0.6 mg/dL (ref 0.3–1.2)
Total Protein: 7.2 g/dL (ref 6.5–8.1)

## 2023-05-04 ENCOUNTER — Ambulatory Visit (INDEPENDENT_AMBULATORY_CARE_PROVIDER_SITE_OTHER): Payer: BC Managed Care – PPO | Admitting: Physician Assistant

## 2023-05-04 ENCOUNTER — Encounter (INDEPENDENT_AMBULATORY_CARE_PROVIDER_SITE_OTHER): Payer: Self-pay | Admitting: Physician Assistant

## 2023-05-04 VITALS — BP 109/72 | HR 68 | Temp 97.8°F | Ht 63.0 in | Wt 191.0 lb

## 2023-05-04 DIAGNOSIS — F3289 Other specified depressive episodes: Secondary | ICD-10-CM

## 2023-05-04 DIAGNOSIS — R7303 Prediabetes: Secondary | ICD-10-CM

## 2023-05-04 DIAGNOSIS — K5904 Chronic idiopathic constipation: Secondary | ICD-10-CM

## 2023-05-04 DIAGNOSIS — Z6833 Body mass index (BMI) 33.0-33.9, adult: Secondary | ICD-10-CM

## 2023-05-04 DIAGNOSIS — M25561 Pain in right knee: Secondary | ICD-10-CM | POA: Insufficient documentation

## 2023-05-04 DIAGNOSIS — F5089 Other specified eating disorder: Secondary | ICD-10-CM | POA: Diagnosis not present

## 2023-05-04 DIAGNOSIS — E669 Obesity, unspecified: Secondary | ICD-10-CM | POA: Diagnosis not present

## 2023-05-04 DIAGNOSIS — K5900 Constipation, unspecified: Secondary | ICD-10-CM | POA: Diagnosis not present

## 2023-05-04 DIAGNOSIS — G4733 Obstructive sleep apnea (adult) (pediatric): Secondary | ICD-10-CM | POA: Diagnosis not present

## 2023-05-04 MED ORDER — OZEMPIC (0.25 OR 0.5 MG/DOSE) 2 MG/3ML ~~LOC~~ SOPN
0.5000 mg | PEN_INJECTOR | SUBCUTANEOUS | 0 refills | Status: DC
Start: 2023-05-04 — End: 2023-05-22

## 2023-05-04 MED ORDER — ESCITALOPRAM OXALATE 10 MG PO TABS
10.0000 mg | ORAL_TABLET | Freq: Every day | ORAL | 0 refills | Status: DC
Start: 2023-05-04 — End: 2023-08-09

## 2023-05-04 NOTE — Progress Notes (Signed)
.smr  Office: [phone]  /  Fax: [phone]  WEIGHT SUMMARY AND BIOMETRICS  Vitals Temp: 97.8 F (36.6 C) BP: 109/72 Pulse Rate: 68 SpO2: 98 %   Anthropometric Measurements Height: 5\' 3"  (1.6 m) Weight: 191 lb (86.6 kg) BMI (Calculated): 33.84 Weight at Last Visit: 189 lb Weight Lost Since Last Visit: 0 Weight Gained Since Last Visit: 2 lb Starting Weight: 191 lb Total Weight Loss (lbs): 0 lb (0 kg) Peak Weight: 196 lb   Body Composition  Body Fat %: 39.2 % Fat Mass (lbs): 75 lbs Muscle Mass (lbs): 110.6 lbs Total Body Water (lbs): 75 lbs Visceral Fat Rating : 11   Other Clinical Data Fasting: no Labs: no Today's Visit #: 51 Starting Date: 05/15/19     HPI  Chief Complaint: OBESITY  Heather Hayes is here to discuss her progress with her obesity treatment plan. She is on the the Category 2 Plan and states she is following her eating plan approximately 50-65 % of the time. She states she is exercising 0 minutes 0 times per week.   Interval History:  Since last office visit she is up 2 lbs.   The patient is a 62 year old female with a history of obesity, prediabetes, vitamin D deficiency, and emotional eating behaviors. She recently suffered a torn meniscus of her right knee and is may need surgery.  She reports that her current dose of Ozempic 0.25mg  has helped suppress her appetite and she has not experienced any adverse effects.  She also takes Lexapro and is considering changing the timing of this medication to the morning.  The patient struggles with constipation and takes Miralax as needed. She is considering increasing her fiber intake and we discussed adding psyllium husk capsules 2-4 capsules daily to maintain regularity.   She is planning a cruise in November.  Pharmacotherapy: Ozempic 0.25 mg weekly for the past 3 weeks. Denies mass in neck, dysphagia, dyspepsia, persistent hoarseness, abdominal pain, or N/V/Constipation or diarrhea. Has annual eye  exam. Mood is stable.  Has some chronic constipation issues predating use of Ozempic.   TREATMENT PLAN FOR OBESITY:  Recommended Dietary Goals  Ellena is currently in the action stage of change. As such, her goal is to continue weight management plan. She has agreed to the Category 2 Plan.  Behavioral Intervention  We discussed the following Behavioral Modification Strategies today: increasing lean protein intake, decreasing simple carbohydrates , increasing vegetables, increasing lower glycemic fruits, increasing fiber rich foods, avoiding skipping meals, increasing water intake, emotional eating strategies and understanding the difference between hunger signals and cravings, work on managing stress, creating time for self-care and relaxation measures, continue to practice mindfulness when eating, and planning for success.  Additional resources provided today: NA  Recommended Physical Activity Goals  Cherron has been advised to work up to 150 minutes of moderate intensity aerobic activity a week and strengthening exercises 2-3 times per week for cardiovascular health, weight loss maintenance and preservation of muscle mass.   She has agreed to Continue current level of physical activity  She has a torn meniscus and may need surgery and this is limiting her activity level currently.    Pharmacotherapy We discussed various medication options to help Enjoli with her weight loss efforts and we both agreed to increase Ozempic to 0.5 mg weekly.    Return in about 4 weeks (around 06/01/2023).Marland Kitchen She was informed of the importance of frequent follow up visits to maximize her success with intensive lifestyle modifications for her multiple  health conditions.  PHYSICAL EXAM:  Blood pressure 109/72, pulse 68, temperature 97.8 F (36.6 C), height 5\' 3"  (1.6 m), weight 191 lb (86.6 kg), SpO2 98%. Body mass index is 33.83 kg/m.  General: She is overweight, cooperative, alert, well developed, and in no  acute distress. PSYCH: Has normal mood, affect and thought process.   Cardiovascular: HR 60's BP 109/72 Lungs: Normal breathing effort, no conversational dyspnea. Neuro: no focal deficits.   DIAGNOSTIC DATA REVIEWED:  BMET    Component Value Date/Time   NA 138 04/28/2023 0932   NA 142 05/26/2021 0803   K 4.2 04/28/2023 0932   CL 105 04/28/2023 0932   CO2 27 04/28/2023 0932   GLUCOSE 82 04/28/2023 0932   BUN 18 04/28/2023 0932   BUN 15 05/26/2021 0803   CREATININE 0.75 04/28/2023 0932   CALCIUM 9.4 04/28/2023 0932   GFRNONAA >60 04/28/2023 0932   GFRAA 106 05/20/2020 0930   GFRAA >60 04/25/2020 0920   Lab Results  Component Value Date   HGBA1C 5.7 10/20/2022   HGBA1C 5.4 05/15/2019   Lab Results  Component Value Date   INSULIN 6.4 11/09/2021   INSULIN 14.9 05/15/2019   Lab Results  Component Value Date   TSH 1.30 10/20/2022   CBC    Component Value Date/Time   WBC 5.8 04/28/2023 0932   WBC 6.3 09/06/2007 1520   RBC 4.14 04/28/2023 0932   HGB 12.7 04/28/2023 0932   HGB 13.7 09/29/2020 0813   HCT 39.0 04/28/2023 0932   HCT 40.8 09/29/2020 0813   PLT 249 04/28/2023 0932   PLT 275 09/29/2020 0813   MCV 94.2 04/28/2023 0932   MCV 92 09/29/2020 0813   MCH 30.7 04/28/2023 0932   MCHC 32.6 04/28/2023 0932   RDW 12.8 04/28/2023 0932   RDW 12.2 09/29/2020 0813   Iron Studies No results found for: "IRON", "TIBC", "FERRITIN", "IRONPCTSAT" Lipid Panel     Component Value Date/Time   CHOL 209 (A) 10/20/2022 0000   CHOL 219 (H) 11/09/2021 0832   TRIG 262 (A) 10/20/2022 0000   HDL 3 (A) 10/20/2022 0000   HDL 77 11/09/2021 0832   CHOLHDL 2.8 05/20/2020 0930   LDLCALC 93 10/20/2022 0000   LDLCALC 121 (H) 11/09/2021 0832   Hepatic Function Panel     Component Value Date/Time   PROT 7.2 04/28/2023 0932   PROT 7.2 05/26/2021 0803   ALBUMIN 4.0 04/28/2023 0932   ALBUMIN 4.4 05/26/2021 0803   AST 15 04/28/2023 0932   ALT 15 04/28/2023 0932   ALKPHOS 78  04/28/2023 0932   BILITOT 0.6 04/28/2023 0932      Component Value Date/Time   TSH 1.30 10/20/2022 0000   TSH 1.290 11/09/2021 0832   Nutritional Lab Results  Component Value Date   VD25OH 81.0 11/09/2021   VD25OH 86.4 05/26/2021   VD25OH 77.6 09/29/2020    ASSOCIATED CONDITIONS ADDRESSED TODAY  ASSESSMENT AND PLAN  Problem List Items Addressed This Visit     Vitamin D deficiency   Generalized obesity   Relevant Medications   Semaglutide,0.25 or 0.5MG /DOS, (OZEMPIC, 0.25 OR 0.5 MG/DOSE,) 2 MG/3ML SOPN   Eating disorder   Depression   Relevant Medications   escitalopram (LEXAPRO) 10 MG tablet   Prediabetes - Primary   Relevant Medications   Semaglutide,0.25 or 0.5MG /DOS, (OZEMPIC, 0.25 OR 0.5 MG/DOSE,) 2 MG/3ML SOPN    Prediabetes Patient is on Ozempic 0.25mg  with noted appetite suppression and no significant side effects. -Increase/Refill Ozempic to  0.5mg  weekly -Continue efforts to increase water intake and dietary fiber. -Consider adding psyllium husk capsules for additional fiber intake.  Constipation Lelani notes constipation.   She reports is chronic issue for her and has not worsened with Ozempic.  She has been taking miralax intermittently. . Constipation is moderately controlled.   Plan: Increase fiber and water. Can start psyllium husk capsules 2-4 capsules daily.  Add MiraLAX once daily. Monitor closely with titration of Ozempic.   Other depression/emotional eating Zaila has had issues with stress/emotional eating. Currently this is well controlled. Overall mood is stable. Medication(s): Other: Lexapro 10 mg daily. No side effects.   Plan: Continue and refill Other: Lexapro 10 mg daily.  Continue to work on emotional eating strategies.  Torn Meniscus/Knee injury Patient has a work-related injury and might need arthroscopic surgery for removal. -Continue current management and follow up with orthopedic surgeon on 05/24/2023.  Follow-up in 4 weeks  on 06/08/2023.  ATTESTASTION STATEMENTS:  Reviewed by clinician on day of visit: allergies, medications, problem list, medical history, surgical history, family history, social history, and previous encounter notes.   I have personally spent 40 minutes total time today in preparation, patient care, nutritional counseling and documentation for this visit, including the following: review of clinical lab tests; review of medical tests/procedures/services.      Harsh Trulock, PA-C

## 2023-05-13 DIAGNOSIS — G4733 Obstructive sleep apnea (adult) (pediatric): Secondary | ICD-10-CM | POA: Diagnosis not present

## 2023-05-20 DIAGNOSIS — N3000 Acute cystitis without hematuria: Secondary | ICD-10-CM | POA: Diagnosis not present

## 2023-05-20 DIAGNOSIS — R110 Nausea: Secondary | ICD-10-CM | POA: Diagnosis not present

## 2023-05-20 DIAGNOSIS — R1031 Right lower quadrant pain: Secondary | ICD-10-CM | POA: Diagnosis not present

## 2023-05-20 DIAGNOSIS — R319 Hematuria, unspecified: Secondary | ICD-10-CM | POA: Diagnosis not present

## 2023-05-22 ENCOUNTER — Other Ambulatory Visit: Payer: Self-pay

## 2023-05-22 ENCOUNTER — Inpatient Hospital Stay (HOSPITAL_COMMUNITY)
Admission: EM | Admit: 2023-05-22 | Discharge: 2023-05-23 | DRG: 345 | Disposition: A | Discharge status: Home / Self Care | Payer: BC Managed Care – PPO | Attending: Internal Medicine | Admitting: Internal Medicine

## 2023-05-22 ENCOUNTER — Emergency Department (HOSPITAL_COMMUNITY): Payer: BC Managed Care – PPO | Admitting: Certified Registered Nurse Anesthetist

## 2023-05-22 ENCOUNTER — Encounter (HOSPITAL_COMMUNITY): Payer: Self-pay

## 2023-05-22 ENCOUNTER — Encounter (HOSPITAL_COMMUNITY): Payer: Self-pay | Admitting: *Deleted

## 2023-05-22 ENCOUNTER — Encounter (HOSPITAL_COMMUNITY)
Admission: EM | Disposition: A | Discharge status: Home / Self Care | Payer: Self-pay | Source: Home / Self Care | Attending: Internal Medicine

## 2023-05-22 ENCOUNTER — Emergency Department (HOSPITAL_COMMUNITY): Payer: BC Managed Care – PPO

## 2023-05-22 DIAGNOSIS — Z803 Family history of malignant neoplasm of breast: Secondary | ICD-10-CM | POA: Diagnosis not present

## 2023-05-22 DIAGNOSIS — K219 Gastro-esophageal reflux disease without esophagitis: Secondary | ICD-10-CM | POA: Diagnosis present

## 2023-05-22 DIAGNOSIS — F419 Anxiety disorder, unspecified: Secondary | ICD-10-CM | POA: Diagnosis not present

## 2023-05-22 DIAGNOSIS — Z853 Personal history of malignant neoplasm of breast: Secondary | ICD-10-CM

## 2023-05-22 DIAGNOSIS — E785 Hyperlipidemia, unspecified: Secondary | ICD-10-CM | POA: Diagnosis not present

## 2023-05-22 DIAGNOSIS — D259 Leiomyoma of uterus, unspecified: Secondary | ICD-10-CM | POA: Diagnosis not present

## 2023-05-22 DIAGNOSIS — D72829 Elevated white blood cell count, unspecified: Secondary | ICD-10-CM | POA: Diagnosis not present

## 2023-05-22 DIAGNOSIS — Z79811 Long term (current) use of aromatase inhibitors: Secondary | ICD-10-CM

## 2023-05-22 DIAGNOSIS — Z923 Personal history of irradiation: Secondary | ICD-10-CM | POA: Diagnosis not present

## 2023-05-22 DIAGNOSIS — N739 Female pelvic inflammatory disease, unspecified: Secondary | ICD-10-CM | POA: Diagnosis not present

## 2023-05-22 DIAGNOSIS — R630 Anorexia: Secondary | ICD-10-CM | POA: Diagnosis not present

## 2023-05-22 DIAGNOSIS — G4730 Sleep apnea, unspecified: Secondary | ICD-10-CM | POA: Diagnosis present

## 2023-05-22 DIAGNOSIS — N3289 Other specified disorders of bladder: Secondary | ICD-10-CM | POA: Diagnosis not present

## 2023-05-22 DIAGNOSIS — Z6833 Body mass index (BMI) 33.0-33.9, adult: Secondary | ICD-10-CM

## 2023-05-22 DIAGNOSIS — Z791 Long term (current) use of non-steroidal anti-inflammatories (NSAID): Secondary | ICD-10-CM | POA: Diagnosis not present

## 2023-05-22 DIAGNOSIS — K611 Rectal abscess: Principal | ICD-10-CM | POA: Diagnosis present

## 2023-05-22 DIAGNOSIS — R7303 Prediabetes: Secondary | ICD-10-CM | POA: Diagnosis not present

## 2023-05-22 DIAGNOSIS — Z79899 Other long term (current) drug therapy: Secondary | ICD-10-CM

## 2023-05-22 DIAGNOSIS — E669 Obesity, unspecified: Secondary | ICD-10-CM | POA: Diagnosis present

## 2023-05-22 DIAGNOSIS — Z8349 Family history of other endocrine, nutritional and metabolic diseases: Secondary | ICD-10-CM | POA: Diagnosis not present

## 2023-05-22 DIAGNOSIS — D649 Anemia, unspecified: Secondary | ICD-10-CM | POA: Diagnosis not present

## 2023-05-22 DIAGNOSIS — B999 Unspecified infectious disease: Secondary | ICD-10-CM | POA: Diagnosis present

## 2023-05-22 DIAGNOSIS — Z8249 Family history of ischemic heart disease and other diseases of the circulatory system: Secondary | ICD-10-CM

## 2023-05-22 DIAGNOSIS — Z833 Family history of diabetes mellitus: Secondary | ICD-10-CM | POA: Diagnosis not present

## 2023-05-22 DIAGNOSIS — R109 Unspecified abdominal pain: Secondary | ICD-10-CM | POA: Diagnosis not present

## 2023-05-22 DIAGNOSIS — M1990 Unspecified osteoarthritis, unspecified site: Secondary | ICD-10-CM | POA: Diagnosis present

## 2023-05-22 DIAGNOSIS — C50919 Malignant neoplasm of unspecified site of unspecified female breast: Secondary | ICD-10-CM | POA: Diagnosis not present

## 2023-05-22 DIAGNOSIS — Z79890 Hormone replacement therapy (postmenopausal): Secondary | ICD-10-CM

## 2023-05-22 DIAGNOSIS — L0390 Cellulitis, unspecified: Secondary | ICD-10-CM

## 2023-05-22 DIAGNOSIS — Z888 Allergy status to other drugs, medicaments and biological substances status: Secondary | ICD-10-CM | POA: Diagnosis not present

## 2023-05-22 DIAGNOSIS — Z8041 Family history of malignant neoplasm of ovary: Secondary | ICD-10-CM

## 2023-05-22 DIAGNOSIS — D62 Acute posthemorrhagic anemia: Secondary | ICD-10-CM | POA: Diagnosis not present

## 2023-05-22 DIAGNOSIS — R509 Fever, unspecified: Secondary | ICD-10-CM | POA: Diagnosis not present

## 2023-05-22 DIAGNOSIS — R10813 Right lower quadrant abdominal tenderness: Secondary | ICD-10-CM | POA: Diagnosis not present

## 2023-05-22 HISTORY — DX: Sleep apnea, unspecified: G47.30

## 2023-05-22 HISTORY — PX: INCISION AND DRAINAGE PERIRECTAL ABSCESS: SHX1804

## 2023-05-22 LAB — CBC WITH DIFFERENTIAL/PLATELET
Abs Immature Granulocytes: 0.2 10*3/uL — ABNORMAL HIGH (ref 0.00–0.07)
Basophils Absolute: 0.1 10*3/uL (ref 0.0–0.1)
Basophils Relative: 0 %
Eosinophils Absolute: 0.1 10*3/uL (ref 0.0–0.5)
Eosinophils Relative: 0 %
HCT: 34.4 % — ABNORMAL LOW (ref 36.0–46.0)
Hemoglobin: 11.2 g/dL — ABNORMAL LOW (ref 12.0–15.0)
Immature Granulocytes: 1 %
Lymphocytes Relative: 9 %
Lymphs Abs: 1.2 10*3/uL (ref 0.7–4.0)
MCH: 30.2 pg (ref 26.0–34.0)
MCHC: 32.6 g/dL (ref 30.0–36.0)
MCV: 92.7 fL (ref 80.0–100.0)
Monocytes Absolute: 1 10*3/uL (ref 0.1–1.0)
Monocytes Relative: 7 %
Neutro Abs: 11.6 10*3/uL — ABNORMAL HIGH (ref 1.7–7.7)
Neutrophils Relative %: 83 %
Platelets: 266 10*3/uL (ref 150–400)
RBC: 3.71 MIL/uL — ABNORMAL LOW (ref 3.87–5.11)
RDW: 13.2 % (ref 11.5–15.5)
WBC: 14.1 10*3/uL — ABNORMAL HIGH (ref 4.0–10.5)
nRBC: 0 % (ref 0.0–0.2)

## 2023-05-22 LAB — COMPREHENSIVE METABOLIC PANEL
ALT: 24 U/L (ref 0–44)
AST: 27 U/L (ref 15–41)
Albumin: 2.4 g/dL — ABNORMAL LOW (ref 3.5–5.0)
Alkaline Phosphatase: 83 U/L (ref 38–126)
Anion gap: 10 (ref 5–15)
BUN: 10 mg/dL (ref 8–23)
CO2: 23 mmol/L (ref 22–32)
Calcium: 8.3 mg/dL — ABNORMAL LOW (ref 8.9–10.3)
Chloride: 101 mmol/L (ref 98–111)
Creatinine, Ser: 0.66 mg/dL (ref 0.44–1.00)
GFR, Estimated: >60 mL/min (ref >60)
Glucose, Bld: 99 mg/dL (ref 70–99)
Potassium: 4.3 mmol/L (ref 3.5–5.1)
Sodium: 134 mmol/L — ABNORMAL LOW (ref 135–145)
Total Bilirubin: 1.2 mg/dL (ref 0.3–1.2)
Total Protein: 6.3 g/dL — ABNORMAL LOW (ref 6.5–8.1)

## 2023-05-22 LAB — I-STAT CG4 LACTIC ACID, ED: Lactic Acid, Venous: 1.1 mmol/L (ref 0.5–1.9)

## 2023-05-22 SURGERY — EXAM UNDER ANESTHESIA
Anesthesia: General | Site: Perineum

## 2023-05-22 MED ORDER — FENTANYL CITRATE (PF) 250 MCG/5ML IJ SOLN
INTRAMUSCULAR | Status: AC
  Filled 2023-05-22: qty 5

## 2023-05-22 MED ORDER — CHLORHEXIDINE GLUCONATE 0.12 % MT SOLN: 15.0000 mL | Freq: Once | OROMUCOSAL | Status: AC

## 2023-05-22 MED ORDER — FENTANYL CITRATE (PF) 100 MCG/2ML IJ SOLN: 25.0000 ug | INTRAMUSCULAR | Status: DC | PRN

## 2023-05-22 MED ORDER — SODIUM CHLORIDE 0.9 % IV SOLN: INTRAVENOUS | Status: DC | PRN

## 2023-05-22 MED ORDER — KETOROLAC TROMETHAMINE 30 MG/ML IJ SOLN
INTRAMUSCULAR | Status: DC | PRN
  Administered 2023-05-22: 5 mg via INTRAVENOUS

## 2023-05-22 MED ORDER — BUPIVACAINE-EPINEPHRINE 0.25% -1:200000 IJ SOLN
INTRAMUSCULAR | Status: DC | PRN
Start: 2023-05-22 — End: 2023-05-22
  Administered 2023-05-22: 30 mL

## 2023-05-22 MED ORDER — ACETAMINOPHEN 10 MG/ML IV SOLN
INTRAVENOUS | Status: DC | PRN
  Administered 2023-05-22: 1000 mg via INTRAVENOUS

## 2023-05-22 MED ORDER — MIDAZOLAM HCL 2 MG/2ML IJ SOLN
INTRAMUSCULAR | Status: AC
Start: 2023-05-22 — End: ?
  Filled 2023-05-22: qty 2

## 2023-05-22 MED ORDER — AMISULPRIDE (ANTIEMETIC) 5 MG/2ML IV SOLN: 10.0000 mg | Freq: Once | INTRAVENOUS | Status: DC | PRN

## 2023-05-22 MED ORDER — ONDANSETRON HCL 4 MG/2ML IJ SOLN
INTRAMUSCULAR | Status: DC | PRN
  Administered 2023-05-22: 4 mg via INTRAVENOUS

## 2023-05-22 MED ORDER — 0.9 % SODIUM CHLORIDE (POUR BTL) OPTIME
TOPICAL | Status: DC | PRN
  Administered 2023-05-22: 1000 mL

## 2023-05-22 MED ORDER — BUPIVACAINE-EPINEPHRINE (PF) 0.25% -1:200000 IJ SOLN
INTRAMUSCULAR | Status: AC
  Filled 2023-05-22: qty 30

## 2023-05-22 MED ORDER — PIPERACILLIN-TAZOBACTAM 3.375 G IVPB
3.3750 g | Freq: Three times a day (TID) | INTRAVENOUS | Status: DC
Start: 2023-05-22 — End: 2023-05-23
  Administered 2023-05-22 – 2023-05-23 (×3): 3.375 g via INTRAVENOUS
  Filled 2023-05-22 (×3): qty 50

## 2023-05-22 MED ORDER — CHLORHEXIDINE GLUCONATE 0.12 % MT SOLN
OROMUCOSAL | Status: AC
  Administered 2023-05-22: 15 mL via OROMUCOSAL
  Filled 2023-05-22: qty 15

## 2023-05-22 MED ORDER — ORAL CARE MOUTH RINSE: 15.0000 mL | Freq: Once | OROMUCOSAL | Status: AC

## 2023-05-22 MED ORDER — FENTANYL CITRATE (PF) 250 MCG/5ML IJ SOLN
INTRAMUSCULAR | Status: DC | PRN
  Administered 2023-05-22 (×3): 50 ug via INTRAVENOUS

## 2023-05-22 MED ORDER — LIDOCAINE 2% (20 MG/ML) 5 ML SYRINGE
INTRAMUSCULAR | Status: DC | PRN
  Administered 2023-05-22: 60 mg via INTRAVENOUS

## 2023-05-22 MED ORDER — PROPOFOL 10 MG/ML IV BOLUS
INTRAVENOUS | Status: DC | PRN
  Administered 2023-05-22: 200 mg via INTRAVENOUS

## 2023-05-22 MED ORDER — SODIUM CHLORIDE 0.9% FLUSH
10.0000 mL | Freq: Once | INTRAVENOUS | Status: AC
  Administered 2023-05-22: 10 mL via INTRAVENOUS

## 2023-05-22 MED ORDER — DEXAMETHASONE SODIUM PHOSPHATE 10 MG/ML IJ SOLN
INTRAMUSCULAR | Status: DC | PRN
  Administered 2023-05-22: 10 mg via INTRAVENOUS

## 2023-05-22 SURGICAL SUPPLY — 48 items
BAG COUNTER SPONGE SURGICOUNT (BAG) ×1 IMPLANT
BAG SPNG CNTER NS LX DISP (BAG) ×1
BLADE SURG 15 STRL LF DISP TIS (BLADE) ×1 IMPLANT
BLADE SURG 15 STRL SS (BLADE) ×1
BRIEF MESH DISP LRG (UNDERPADS AND DIAPERS) ×1 IMPLANT
CANISTER SUCT 3000ML PPV (MISCELLANEOUS) ×1 IMPLANT
CNTNR URN SCR LID CUP LEK RST (MISCELLANEOUS) IMPLANT
CONT SPEC 4OZ STRL OR WHT (MISCELLANEOUS) ×1
COVER SURGICAL LIGHT HANDLE (MISCELLANEOUS) ×1 IMPLANT
DISSECTOR SURG LIGASURE 21 (MISCELLANEOUS) IMPLANT
ELECT CAUTERY BLADE 6.4 (BLADE) ×1 IMPLANT
ELECT REM PT RETURN 9FT ADLT (ELECTROSURGICAL)
ELECTRODE REM PT RTRN 9FT ADLT (ELECTROSURGICAL) IMPLANT
GAUZE PACKING IODOFORM 1/2INX (GAUZE/BANDAGES/DRESSINGS) IMPLANT
GAUZE PAD ABD 8X10 STRL (GAUZE/BANDAGES/DRESSINGS) ×1 IMPLANT
GAUZE SPONGE 4X4 12PLY STRL (GAUZE/BANDAGES/DRESSINGS) ×1 IMPLANT
GLOVE BIO SURGEON STRL SZ7.5 (GLOVE) ×1 IMPLANT
GLOVE INDICATOR 8.0 STRL GRN (GLOVE) ×1 IMPLANT
GOWN STRL REUS W/ TWL LRG LVL3 (GOWN DISPOSABLE) ×1 IMPLANT
GOWN STRL REUS W/ TWL XL LVL3 (GOWN DISPOSABLE) ×1 IMPLANT
GOWN STRL REUS W/TWL LRG LVL3 (GOWN DISPOSABLE) ×1
GOWN STRL REUS W/TWL XL LVL3 (GOWN DISPOSABLE) ×1
KIT BASIN OR (CUSTOM PROCEDURE TRAY) ×1 IMPLANT
KIT TURNOVER KIT B (KITS) ×1 IMPLANT
NDL 18GX1X1/2 (RX/OR ONLY) (NEEDLE) IMPLANT
NDL HYPO 22X1.5 SAFETY MO (MISCELLANEOUS) IMPLANT
NDL HYPO 25GX1X1/2 BEV (NEEDLE) ×1 IMPLANT
NEEDLE 18GX1X1/2 (RX/OR ONLY) (NEEDLE) IMPLANT
NEEDLE HYPO 22X1.5 SAFETY MO (MISCELLANEOUS) ×1 IMPLANT
NEEDLE HYPO 25GX1X1/2 BEV (NEEDLE) IMPLANT
NS IRRIG 1000ML POUR BTL (IV SOLUTION) ×1 IMPLANT
PACK LITHOTOMY IV (CUSTOM PROCEDURE TRAY) ×1 IMPLANT
PAD ARMBOARD 7.5X6 YLW CONV (MISCELLANEOUS) ×1 IMPLANT
PENCIL BUTTON HOLSTER BLD 10FT (ELECTRODE) ×1 IMPLANT
PENCIL SMOKE EVACUATOR (MISCELLANEOUS) ×1 IMPLANT
SPONGE T-LAP 18X18 ~~LOC~~+RFID (SPONGE) ×1 IMPLANT
SPONGE T-LAP 4X18 ~~LOC~~+RFID (SPONGE) ×1 IMPLANT
SURGILUBE 2OZ TUBE FLIPTOP (MISCELLANEOUS) ×1 IMPLANT
SUT CHROMIC 3 0 SH 27 (SUTURE) ×1 IMPLANT
SWAB COLLECTION DEVICE MRSA (MISCELLANEOUS) IMPLANT
SWAB CULTURE ESWAB REG 1ML (MISCELLANEOUS) IMPLANT
SYR 20ML LL LF (SYRINGE) IMPLANT
SYR CONTROL 10ML LL (SYRINGE) ×1 IMPLANT
TOWEL GREEN STERILE (TOWEL DISPOSABLE) ×1 IMPLANT
TOWEL GREEN STERILE FF (TOWEL DISPOSABLE) ×1 IMPLANT
TUBE CONNECTING 12X1/4 (SUCTIONS) ×1 IMPLANT
UNDERPAD 30X36 HEAVY ABSORB (UNDERPADS AND DIAPERS) ×1 IMPLANT
YANKAUER SUCT BULB TIP NO VENT (SUCTIONS) ×1 IMPLANT

## 2023-05-22 NOTE — Transfer of Care (Signed)
Immediate Anesthesia Transfer of Care Note  Patient: Heather Hayes  Procedure(s) Performed: ANORECTAL EXAM UNDER ANESTHESIA (Perineum) IRRIGATION AND DEBRIDEMENT PERIRECTAL ABSCESS AND DEBRIDEMENT OF INVOLVED TISSUES (Perineum)  Patient Location: PACU  Anesthesia Type:General  Level of Consciousness: awake, alert , and oriented  Airway & Oxygen Therapy: Patient Spontanous Breathing  Post-op Assessment: Report given to RN and Post -op Vital signs reviewed and stable  Post vital signs: Reviewed and stable  Last Vitals:  Vitals Value Taken Time  BP    Temp    Pulse    Resp    SpO2      Last Pain:  Vitals:   05/22/23 1700  TempSrc: Oral  PainSc:          Complications: No notable events documented.

## 2023-05-22 NOTE — ED Provider Notes (Addendum)
[place] EMERGENCY DEPARTMENT AT [facility] Provider Note   CSN: [id number] Arrival date & time: 05/22/23  1438     History  Chief Complaint  Patient presents with   Wound Infection    Heather Hayes is a 62 y.o. female.  Patient transferred here with concern for necrotizing infection in her left perirectal area.  She started having some pain in her rectal area about 2 days ago started clindamycin got worse went to [facility] and had CT scan done that showed phlegmon numbness changes around the left perirectal area with concern for maybe necrotizing process.  She was started on vancomycin and Zosyn sent here for evaluation with general surgery.  She states pain is well-controlled.  She has had fever.  She has history of anxiety but denies any history of diabetes.  History of breast cancer in the past.  The history is provided by the patient.       Home Medications Prior to Admission medications   Medication Sig Start Date End Date Taking? Authorizing Provider  anastrozole (ARIMIDEX) 1 MG tablet TAKE 1 TABLET BY MOUTH EVERY DAY 01/13/23  Yes Pollyann Samples, NP  cholecalciferol (VITAMIN D3) 25 MCG (1000 UNIT) tablet Take 1,000 Units by mouth daily.   Yes [provider]  clindamycin (CLEOCIN) 300 MG capsule Take 300 mg by mouth 3 (three) times daily. 05/20/23  Yes [provider]  clonazePAM (KLONOPIN) 0.5 MG tablet Take 0.5 mg by mouth 2 (two) times daily as needed for anxiety.   Yes [provider]  escitalopram (LEXAPRO) 10 MG tablet Take 1 tablet (10 mg total) by mouth daily. 05/04/23 08/02/23 Yes Rayburn, Fanny Bien, PA-C  meloxicam (MOBIC) 15 MG tablet Take 15 mg by mouth daily.   Yes [provider]      Allergies    Compazine [prochlorperazine edisylate]    Review of Systems   Review of Systems  Physical Exam Updated Vital Signs BP 123/67 (BP Location: Left Arm)   Pulse 82   Temp 99.8 F (37.7 C) (Oral)    Resp 14   Ht 5\' 3"  (1.6 m)   Wt 86.6 kg   SpO2 99%   BMI 33.83 kg/m  Physical Exam Vitals and nursing note reviewed.  Constitutional:      General: She is not in acute distress.    Appearance: She is well-developed. She is not ill-appearing.  HENT:     Head: Normocephalic and atraumatic.     Nose: Nose normal.     Mouth/Throat:     Mouth: Mucous membranes are moist.  Eyes:     Conjunctiva/sclera: Conjunctivae normal.     Pupils: Pupils are equal, round, and reactive to light.  Cardiovascular:     Rate and Rhythm: Normal rate and regular rhythm.     Pulses: Normal pulses.     Heart sounds: No murmur heard. Pulmonary:     Effort: Pulmonary effort is normal. No respiratory distress.     Breath sounds: Normal breath sounds.  Abdominal:     Palpations: Abdomen is soft.     Tenderness: There is no abdominal tenderness.  Musculoskeletal:        General: No swelling.     Cervical back: Neck supple.  Skin:    General: Skin is warm and dry.     Capillary Refill: Capillary refill takes less than 2 seconds.     Comments: There is some pain and tenderness and discoloration in the left  perirectal area but no obvious crepitus or major fluctuance in the perirectal area, seems fairly localized  Neurological:     General: No focal deficit present.     Mental Status: She is alert.  Psychiatric:        Mood and Affect: Mood normal.     ED Results / Procedures / Treatments   Labs (all labs ordered are listed, but only abnormal results are displayed) Labs Reviewed  CBC WITH DIFFERENTIAL/PLATELET - Abnormal; Notable for the following components:      Result Value   WBC 14.1 (*)    RBC 3.71 (*)    Hemoglobin 11.2 (*)    HCT 34.4 (*)    Neutro Abs 11.6 (*)    Abs Immature Granulocytes 0.20 (*)    All other components within normal limits  COMPREHENSIVE METABOLIC PANEL  I-STAT CG4 LACTIC ACID, ED    EKG None  Radiology No results found.  Procedures Procedures     Medications Ordered in ED Medications - No data to display  ED Course/ Medical Decision Making/ A&P                                 Medical Decision Making Amount and/or Complexity of Data Reviewed Labs: ordered. Radiology: ordered.  Risk Decision regarding hospitalization.   Heather Hayes is here as outside hospital transfer for concern for necrotizing cellulitis in the perirectal area.  History of breast cancer in remission, anxiety.  Per review of [facility] chart there is concern for may be necrotizing infectious process in the left perirectal area.  Patient had fever, white count of 17 but lab work otherwise unremarkable.  Will repeat basic labs here and get a lactic acid.  She is already received vancomycin and Zosyn.  Dr. Hillery Hunter with general surgery is already aware of this patient as he excepted in transfer as well as ED team.  I have messaged him to make him aware that patient is here.  I am trying to get [facility] to push CT images over into our system so he can review.  Overall anticipate admission to surgical service.  Clinically she does have some discoloration in the perirectal area but no obvious crepitus or fluctuance.  Seems localized at this time.  Patient to be admitted to surgery service.  Hemodynamically stable throughout my care.  Unable to get images crossed over quick enough and talked with general surgery and we will just get a CT pelvis without contrast to help with his surgical planning. Patient to OR.  This chart was dictated using voice recognition software.  Despite best efforts to proofread,  errors can occur which can change the documentation meaning.     Final Clinical Impression(s) / ED Diagnoses Final diagnoses:  Perirectal abscess  Necrotizing cellulitis    Rx / DC Orders ED Discharge Orders     None         Virgina Norfolk, DO 05/22/23 1526    Virgina Norfolk, DO 05/22/23 1537

## 2023-05-22 NOTE — Anesthesia Preprocedure Evaluation (Addendum)
Anesthesia Evaluation  Patient identified by MRN, date of birth, ID band Patient awake    Reviewed: Allergy & Precautions, NPO status , Patient's Chart, lab work & pertinent test results  Airway Mallampati: II  TM Distance: >3 FB Neck ROM: Full    Dental  (+) Dental Advisory Given   Pulmonary sleep apnea    breath sounds clear to auscultation       Cardiovascular negative cardio ROS  Rhythm:Regular Rate:Normal     Neuro/Psych negative neurological ROS     GI/Hepatic Neg liver ROS,GERD  ,,  Endo/Other  negative endocrine ROS    Renal/GU negative Renal ROS     Musculoskeletal  (+) Arthritis ,    Abdominal   Peds  Hematology  (+) Blood dyscrasia, anemia   Anesthesia Other Findings   Reproductive/Obstetrics                             Anesthesia Physical Anesthesia Plan  ASA: 2  Anesthesia Plan: General   Post-op Pain Management: Toradol IV (intra-op)* and Ofirmev IV (intra-op)*   Induction: Intravenous  PONV Risk Score and Plan: 3 and Dexamethasone, Ondansetron, Midazolam and Treatment may vary due to age or medical condition  Airway Management Planned: Oral ETT and LMA  Additional Equipment:   Intra-op Plan:   Post-operative Plan: Extubation in OR  Informed Consent: I have reviewed the patients History and Physical, chart, labs and discussed the procedure including the risks, benefits and alternatives for the proposed anesthesia with the patient or authorized representative who has indicated his/her understanding and acceptance.     Dental advisory given  Plan Discussed with: CRNA  Anesthesia Plan Comments:         Anesthesia Quick Evaluation

## 2023-05-22 NOTE — Care Plan (Signed)
Surgery Plan of Care  Patient is now s/p I&D of perirectal abscess. There were no signs of necrotizing infection on exam.  PLAN: - Admit to medicine - Continue zosyn.  Can discontinue vanc and clinda.  Cultures sent - Okay for a diet - Packing will come out on POD 1 - Remainder of care per primary team - Surgery will follow up in AM  Patient's husband updated.  Melody Haver, MD   General Surgeon [facility], [place]

## 2023-05-22 NOTE — Anesthesia Postprocedure Evaluation (Signed)
Anesthesia Post Note  Patient: Heather Hayes  Procedure(s) Performed: ANORECTAL EXAM UNDER ANESTHESIA (Perineum) IRRIGATION AND DEBRIDEMENT PERIRECTAL ABSCESS AND DEBRIDEMENT OF INVOLVED TISSUES (Perineum)     Patient location during evaluation: PACU Anesthesia Type: General Level of consciousness: awake and alert Pain management: pain level controlled Vital Signs Assessment: post-procedure vital signs reviewed and stable Respiratory status: spontaneous breathing, nonlabored ventilation, respiratory function stable and patient connected to nasal cannula oxygen Cardiovascular status: blood pressure returned to baseline and stable Postop Assessment: no apparent nausea or vomiting Anesthetic complications: no  No notable events documented.  Last Vitals:  Vitals:   05/22/23 1900 05/22/23 1930  BP: (!) 102/58 100/62  Pulse: 86 84  Resp: (!) 25 (!) 21  Temp:    SpO2: 94% 95%    Last Pain:  Vitals:   05/22/23 1930  TempSrc:   PainSc: 0-No pain                 Kennieth Rad

## 2023-05-22 NOTE — Consult Note (Signed)
Heather Hayes [birth year]  [id number].    Requesting MD: Lockie Mola Chief Complaint/Reason for Consult: ?Nec Fasc  HPI:  62 y/o F w/ a hx of breast cancer s/p lumpectomy (currently on hormonal therapy), arthritis, and pre-diabetes who presents from an OSH with concern for a perirectal abscess and possible necrotizing infection.  She reports that she first developed pain on Wednesday. Over the next few days she was febrile to 102 and experienced general malaise.  She presented to an OSH ED where CT report is read as gas tracking from the wound w/ concern for necrotizing infection (images not available).  WBC 14. She was afebrile upon arrival.   ROS: Review of Systems  Constitutional:  Positive for fever and malaise/fatigue.  HENT: Negative.    Eyes: Negative.   Respiratory: Negative.    Cardiovascular: Negative.   Gastrointestinal: Negative.   Genitourinary: Negative.   Musculoskeletal: Negative.   Skin:        Perirectal lesion  Neurological: Negative.   Endo/Heme/Allergies: Negative.   Psychiatric/Behavioral: Negative.      Family History  Problem Relation Age of Onset   Breast cancer Mother 60   Diabetes Mother    Hypertension Mother    Hyperlipidemia Mother    Obesity Mother    Diabetes Father    Hypertension Father    Hyperlipidemia Father    Ovarian cancer Maternal Grandmother    Colon cancer Neg Hx    Esophageal cancer Neg Hx    Rectal cancer Neg Hx    Stomach cancer Neg Hx     Past Medical History:  Diagnosis Date   Anxiety    Arthritis    Back pain    Breast cancer (HCC)    Bursitis    Cancer (HCC)    right breast cancer 09-2018   Constipation    GERD (gastroesophageal reflux disease)    some heart burn from Arimidex   History of radiation therapy    completed 01-29-2019   Hyperlipidemia    Joint pain    Personal history of radiation therapy    Rheumatic fever    Swallowing difficulty     Past Surgical History:  Procedure Laterality Date    BREAST LUMPECTOMY Right 10/27/2018   BREAST LUMPECTOMY WITH RADIOACTIVE SEED AND SENTINEL LYMPH NODE BIOPSY Right 10/27/2018   Procedure: RIGHT BREAST LUMPECTOMY WITH RADIOACTIVE SEED AND SENTINEL LYMPH NODE BIOPSY;  Surgeon: Glenna Fellows, MD;  Location:  SURGERY CENTER;  Service: General;  Laterality: Right;   CERVICAL SPINE SURGERY  2009   COLONOSCOPY     ENDOMETRIAL ABLATION     POLYPECTOMY     TUBAL LIGATION     tube ligation       Social History:  reports that she has never smoked. She has never used smokeless tobacco. She reports current alcohol use of about 1.0 - 2.0 standard drink of alcohol per week. She reports that she does not use drugs.  Allergies:  Allergies  Allergen Reactions   Compazine [Prochlorperazine Edisylate] Other (See Comments)    Muscle contractures     (Not in a hospital admission)   Physical Exam: Blood pressure 123/67, pulse 82, temperature 99.8 F (37.7 C), temperature source Oral, resp. rate 14, height 5\' 3"  (1.6 m), weight 86.6 kg, SpO2 99%. Gen: female resting in bed, NAD Resp: equal chest rise Abd: soft, non-distended Perirectal: Erythema and induration along the left gluteal cleft without clear fluctuance, no crepitus, no drainage Neuro: moving all  extremities  Results for orders placed or performed during the hospital encounter of 05/22/23 (from the past 48 hour(s))  CBC with Differential     Status: Abnormal   Collection Time: 05/22/23  3:12 PM  Result Value Ref Range   WBC 14.1 (H) 4.0 - 10.5 K/uL   RBC 3.71 (L) 3.87 - 5.11 MIL/uL   Hemoglobin 11.2 (L) 12.0 - 15.0 g/dL   HCT 16.1 (L) 09.6 - 04.5 %   MCV 92.7 80.0 - 100.0 fL   MCH 30.2 26.0 - 34.0 pg   MCHC 32.6 30.0 - 36.0 g/dL   RDW 40.9 81.1 - 91.4 %   Platelets 266 150 - 400 K/uL   nRBC 0.0 0.0 - 0.2 %   Neutrophils Relative % 83 %   Neutro Abs 11.6 (H) 1.7 - 7.7 K/uL   Lymphocytes Relative 9 %   Lymphs Abs 1.2 0.7 - 4.0 K/uL   Monocytes Relative 7 %    Monocytes Absolute 1.0 0.1 - 1.0 K/uL   Eosinophils Relative 0 %   Eosinophils Absolute 0.1 0.0 - 0.5 K/uL   Basophils Relative 0 %   Basophils Absolute 0.1 0.0 - 0.1 K/uL   Immature Granulocytes 1 %   Abs Immature Granulocytes 0.20 (H) 0.00 - 0.07 K/uL    Comment: Performed at [facility], [address], [place], [place] [address]  I-Stat CG4 Lactic Acid     Status: None   Collection Time: 05/22/23  3:36 PM  Result Value Ref Range   Lactic Acid, Venous 1.1 0.5 - 1.9 mmol/L   No results found.  Assessment/Plan 62 y/o F presenting with a perirectal abscess with concern for nec fasc.   - Reportedly received vanc, zosyn, and clinda at OSH.  Will continue for now - I had to repeat CT here since images not available and it does show air tracking from the gluteus toward the ischial muscles - Will proceed to OR for debridement  - Admit to medicine   FEN - NPO VTE - Okay for DVT ppx ID - vanc, zosyn, clinda Admit - medicine  I reviewed last 24 h vitals and pain scores, last 24 h labs and trends, and last 24 h imaging results.  Tacy Learn Surgery 05/22/2023, 3:43 PM Please see Amion for pager number during day hours 7:00am-4:30pm or 7:00am -11:30am on weekends

## 2023-05-22 NOTE — ED Notes (Signed)
[facility]'s Imaging Department was contacted to request that the images be shared with [facility].  [facility] in Imaging stated that they will push the images.

## 2023-05-22 NOTE — ED Triage Notes (Signed)
Pt here via [facility] from [facility] for surgical consult r/t peri-rectal abscess and abnormal labs.  Sent here for possible necrotizing infection.  Took oral clindamycin from UC.  Given zosyn and vanc at [facility].  VS stable.  Pt ambulatory and ao x 4.  Lactic 1.1 Wbc 17

## 2023-05-22 NOTE — Anesthesia Procedure Notes (Signed)
Procedure Name: LMA Insertion Date/Time: 05/22/2023 5:37 PM  Performed by: Alwyn Ren, CRNAPre-anesthesia Checklist: Timeout performed, Patient identified, Emergency Drugs available, Suction available and Patient being monitored Patient Re-evaluated:Patient Re-evaluated prior to induction Oxygen Delivery Method: Circle system utilized Preoxygenation: Pre-oxygenation with 100% oxygen Induction Type: IV induction LMA Size: 4.0 Number of attempts: 2

## 2023-05-22 NOTE — Progress Notes (Signed)
Pharmacy Antibiotic Note  Heather Hayes is a 62 y.o. female admitted on 05/22/2023 with  perirectal abscess .  Pharmacy has been consulted for Zosyn dosing. Pt transferred from [facility] where she received Vanc/Zosyn/Clinda in ED. Had been taking Clinda 2 days pta. Pt transferred from [facility] where she received Vanc/Zosyn/Clinda.   S/p I&D of perirectal abscess 10/20. CCS okay to continue Zosyn only at this point - no signs of necrotizing infection on exam.   Plan: Zosyn 3.375gm IV q8h Will f/u renal function, micro data, and pt's clinical condition  Height: 5\' 3"  (160 cm) Weight: 86.6 kg (191 lb) IBW/kg (Calculated) : 52.4  Temp (24hrs), Avg:99.4 F (37.4 C), Min:99.1 F (37.3 C), Max:99.8 F (37.7 C)  Recent Labs  Lab 05/22/23 1512 05/22/23 1536  WBC 14.1*  --   CREATININE 0.66  --   LATICACIDVEN  --  1.1    Estimated Creatinine Clearance: 76.1 mL/min (by C-G formula based on SCr of 0.66 mg/dL).    Allergies  Allergen Reactions   Compazine [Prochlorperazine Edisylate] Other (See Comments)    Muscle contractures     Antimicrobials this admission: 10/20  Zosyn >>   Microbiology results: 10/20 perirectal tissue:  Thank you for allowing pharmacy to be a part of this patient's care.  Christoper Fabian, PharmD, BCPS Please see amion for complete clinical pharmacist phone list 05/22/2023 6:59 PM

## 2023-05-22 NOTE — Op Note (Signed)
Preoperative diagnosis: perirectal abscess  Postoperative diagnosis: same   Procedure:  Incision and drainage of perirectal abscess Anorectal exam under anesthesia   Surgeon: Melody Haver, M.D.  Asst: None  Anesthesia: General  Indications for procedure: Heather Hayes is a 62 y.o. year old female who presented with perirectal pain and fevers for several days.  Labs and imaging were concerning for perirectal abscess with possible necrotizing fasciitis.  I recommended that we proceed to the OR for source control.  I addressed all of her questions and written consent was obtained.  Description of procedure: The patient was brought into the operative suite. Anesthesia was administered with General endotracheal anesthesia. WHO checklist was applied. The patient was then placed in lithotomy position. The area was prepped and draped in the usual sterile fashion.  There was an area of induration identified along the left gluteus.  I used a finder needle to probe the area and was able to find a pocket of purulent material. I then used a #15 blade to excise the area of greatest fluctuance and electrocautery to extend the incision down to the pocket.  The cavity probed deep and toward the rectum, consistent with what was seen on the CT. The fluid was sent for culture.  I broke up all loculations and irrigated the wound.  There was obvious purulence but there was bleeding and no sign of necrotic tissue to suggest a necrotizing infection.  An ellipse of tissue was excised to allow for ongoing drainage.  I then proceeded with an anorectal exam.  There were some external skin tags but the anatomy was otherwise normal.  A retractor was inserted into the anus.  There was no obvious fistula or other pathology identified.  A local block was performed using 0.25% marcaine with epinephrine.  The wound was evaluated and appeared hemostatic.  I packed the cavity with iodoform.  A dressing was applied.   The sponge  and instrument counts were reported as correct x 2. The patient was awoken by anesthesia and transported to PACU in stable condition.   Findings: Perirectal abscess without sign of necrotizing infection or other pathology  Specimen: Left gluteal abscess  Blood loss: Minimal   Local anesthesia:  30 ml marcaine   Complications: None  Melody Haver, M.D. General Surgery [facility], [place]

## 2023-05-23 ENCOUNTER — Other Ambulatory Visit (HOSPITAL_COMMUNITY): Payer: Self-pay

## 2023-05-23 ENCOUNTER — Encounter (HOSPITAL_COMMUNITY): Payer: Self-pay | Admitting: General Surgery

## 2023-05-23 DIAGNOSIS — R7303 Prediabetes: Secondary | ICD-10-CM | POA: Diagnosis not present

## 2023-05-23 DIAGNOSIS — D649 Anemia, unspecified: Secondary | ICD-10-CM | POA: Diagnosis not present

## 2023-05-23 DIAGNOSIS — K611 Rectal abscess: Secondary | ICD-10-CM | POA: Diagnosis not present

## 2023-05-23 DIAGNOSIS — F419 Anxiety disorder, unspecified: Secondary | ICD-10-CM

## 2023-05-23 DIAGNOSIS — D72829 Elevated white blood cell count, unspecified: Secondary | ICD-10-CM | POA: Diagnosis not present

## 2023-05-23 DIAGNOSIS — E669 Obesity, unspecified: Secondary | ICD-10-CM

## 2023-05-23 DIAGNOSIS — Z853 Personal history of malignant neoplasm of breast: Secondary | ICD-10-CM

## 2023-05-23 LAB — MAGNESIUM: Magnesium: 2.3 mg/dL (ref 1.7–2.4)

## 2023-05-23 LAB — COMPREHENSIVE METABOLIC PANEL
ALT: 29 U/L (ref 0–44)
AST: 21 U/L (ref 15–41)
Albumin: 2.3 g/dL — ABNORMAL LOW (ref 3.5–5.0)
Alkaline Phosphatase: 86 U/L (ref 38–126)
Anion gap: 7 (ref 5–15)
BUN: 15 mg/dL (ref 8–23)
CO2: 20 mmol/L — ABNORMAL LOW (ref 22–32)
Calcium: 8.1 mg/dL — ABNORMAL LOW (ref 8.9–10.3)
Chloride: 110 mmol/L (ref 98–111)
Creatinine, Ser: 0.66 mg/dL (ref 0.44–1.00)
GFR, Estimated: >60 mL/min (ref >60)
Glucose, Bld: 176 mg/dL — ABNORMAL HIGH (ref 70–99)
Potassium: 4.1 mmol/L (ref 3.5–5.1)
Sodium: 137 mmol/L (ref 135–145)
Total Bilirubin: 0.6 mg/dL (ref 0.3–1.2)
Total Protein: 6.2 g/dL — ABNORMAL LOW (ref 6.5–8.1)

## 2023-05-23 LAB — CBC WITH DIFFERENTIAL/PLATELET
Abs Immature Granulocytes: 0.27 10*3/uL — ABNORMAL HIGH (ref 0.00–0.07)
Basophils Absolute: 0 10*3/uL (ref 0.0–0.1)
Basophils Relative: 0 %
Eosinophils Absolute: 0 10*3/uL (ref 0.0–0.5)
Eosinophils Relative: 0 %
HCT: 34.2 % — ABNORMAL LOW (ref 36.0–46.0)
Hemoglobin: 11.4 g/dL — ABNORMAL LOW (ref 12.0–15.0)
Immature Granulocytes: 2 %
Lymphocytes Relative: 7 %
Lymphs Abs: 1 10*3/uL (ref 0.7–4.0)
MCH: 31.1 pg (ref 26.0–34.0)
MCHC: 33.3 g/dL (ref 30.0–36.0)
MCV: 93.2 fL (ref 80.0–100.0)
Monocytes Absolute: 0.3 10*3/uL (ref 0.1–1.0)
Monocytes Relative: 2 %
Neutro Abs: 12.6 10*3/uL — ABNORMAL HIGH (ref 1.7–7.7)
Neutrophils Relative %: 89 %
Platelets: 315 10*3/uL (ref 150–400)
RBC: 3.67 MIL/uL — ABNORMAL LOW (ref 3.87–5.11)
RDW: 13.4 % (ref 11.5–15.5)
WBC: 14.3 10*3/uL — ABNORMAL HIGH (ref 4.0–10.5)
nRBC: 0 % (ref 0.0–0.2)

## 2023-05-23 MED ORDER — ACETAMINOPHEN 650 MG RE SUPP: 650.0000 mg | Freq: Four times a day (QID) | RECTAL | Status: DC | PRN

## 2023-05-23 MED ORDER — NALOXONE HCL 0.4 MG/ML IJ SOLN: 0.4000 mg | INTRAMUSCULAR | Status: DC | PRN

## 2023-05-23 MED ORDER — FENTANYL CITRATE PF 50 MCG/ML IJ SOSY: 50.0000 ug | PREFILLED_SYRINGE | INTRAMUSCULAR | Status: DC | PRN

## 2023-05-23 MED ORDER — HYDROCODONE-ACETAMINOPHEN 5-325 MG PO TABS
1.0000 | ORAL_TABLET | Freq: Four times a day (QID) | ORAL | Status: DC | PRN
  Administered 2023-05-23: 1 via ORAL
  Filled 2023-05-23: qty 1

## 2023-05-23 MED ORDER — HYDROCODONE-ACETAMINOPHEN 5-325 MG PO TABS
1.0000 | ORAL_TABLET | Freq: Four times a day (QID) | ORAL | 0 refills | Status: DC | PRN
  Filled 2023-05-23: qty 10, 3d supply, fill #0

## 2023-05-23 MED ORDER — ANASTROZOLE 1 MG PO TABS
1.0000 mg | ORAL_TABLET | Freq: Every day | ORAL | Status: DC
  Administered 2023-05-23: 1 mg via ORAL
  Filled 2023-05-23: qty 1

## 2023-05-23 MED ORDER — ENOXAPARIN SODIUM 40 MG/0.4ML IJ SOSY
40.0000 mg | PREFILLED_SYRINGE | INTRAMUSCULAR | Status: DC
  Administered 2023-05-23: 40 mg via SUBCUTANEOUS
  Filled 2023-05-23: qty 0.4

## 2023-05-23 MED ORDER — AMOXICILLIN-POT CLAVULANATE 875-125 MG PO TABS
1.0000 | ORAL_TABLET | Freq: Two times a day (BID) | ORAL | 0 refills | Status: AC
  Filled 2023-05-23: qty 10, 5d supply, fill #0

## 2023-05-23 MED ORDER — ACETAMINOPHEN 325 MG PO TABS
650.0000 mg | ORAL_TABLET | Freq: Four times a day (QID) | ORAL | Status: DC | PRN
  Administered 2023-05-23: 650 mg via ORAL
  Filled 2023-05-23: qty 2

## 2023-05-23 MED ORDER — ESCITALOPRAM OXALATE 10 MG PO TABS
10.0000 mg | ORAL_TABLET | Freq: Every day | ORAL | Status: DC
Start: 2023-05-23 — End: 2023-05-23
  Administered 2023-05-23: 10 mg via ORAL
  Filled 2023-05-23: qty 1

## 2023-05-23 MED ORDER — LACTATED RINGERS IV SOLN: INTRAVENOUS | Status: AC

## 2023-05-23 MED ORDER — ONDANSETRON HCL 4 MG/2ML IJ SOLN: 4.0000 mg | Freq: Four times a day (QID) | INTRAMUSCULAR | Status: DC | PRN

## 2023-05-23 MED ORDER — MELATONIN 3 MG PO TABS: 3.0000 mg | ORAL_TABLET | Freq: Every evening | ORAL | Status: DC | PRN

## 2023-05-23 MED ORDER — CLONAZEPAM 0.5 MG PO TABS: 0.5000 mg | ORAL_TABLET | Freq: Two times a day (BID) | ORAL | Status: DC | PRN

## 2023-05-23 NOTE — H&P (Signed)
History and Physical    Patient: Heather Hayes NWG:[id number] DOB: 05-04-1961 DOA: 05/22/2023 DOS: the patient was seen and examined on 05/23/2023 PCP: Noberto Retort, MD  Patient coming from: Transfer from [facility]  Chief Complaint:  Chief Complaint  Patient presents with   Wound Infection   HPI: Heather Hayes is a 62 y.o. female with medical history significant of hyperlipidemia, prediabetes, breast cancer s/p lumpectomy, arthritis, anxiety, and obesity who presented as a transfer from [facility] for concern of left perirectal abscess.  She started feeling unwell with decreased appetite about 5 days ago.  She initially did not have any rectal pain and had tested herself for COVID due to her symptoms, but the test was negative.  She had gone to urgent care 3 days ago as she was not feeling any better.  The checked her for a urinary tract infection which was negative, but had prescribed her clindamycin.  Over the next couple days patient reported having fevers up to 102.4 F at home prior to arrival and development of pain with sitting.  She had gone to [facility] yesterday because of her symptoms where CT noted concern for possible necrotizing process.  She was given vancomycin and Zosyn prior to being transferred here for evaluation by general surgery.  In the emergency department patient was noted to be afebrile with tachypnea, but all other vital signs relatively maintained.  Labs from 10/20 significant for WBC 14.1, hemoglobin 11.2, sodium 134, albumin 2.4, and lactic acid 1.1.  CT scan of the abdomen pelvis noted persistent changes in the left medial buttock and extending into the ipsilateral ischio-anal fossa with multiple gas locules and stranding edema consistent with necrotizing infection and likely developing abscess.  General surgery had been consulted and took the patient for surgery for debridement.  During the procedure there were no findings consistent with  necrotizing infection.  Interoperative cultures were sent.  Antibiotics were narrowed down to Zosyn.  TRH called to admit.  Review of Systems: As mentioned in the history of present illness. All other systems reviewed and are negative. Past Medical History:  Diagnosis Date   Anxiety    Arthritis    Back pain    Breast cancer (HCC)    Bursitis    Cancer (HCC)    right breast cancer 09-2018   Constipation    GERD (gastroesophageal reflux disease)    some heart burn from Arimidex   History of radiation therapy    completed 01-29-2019   Hyperlipidemia    Joint pain    Personal history of radiation therapy    Rheumatic fever    Sleep apnea    Swallowing difficulty    Past Surgical History:  Procedure Laterality Date   BREAST LUMPECTOMY Right 10/27/2018   BREAST LUMPECTOMY WITH RADIOACTIVE SEED AND SENTINEL LYMPH NODE BIOPSY Right 10/27/2018   Procedure: RIGHT BREAST LUMPECTOMY WITH RADIOACTIVE SEED AND SENTINEL LYMPH NODE BIOPSY;  Surgeon: Glenna Fellows, MD;  Location: [facility];  Service: General;  Laterality: Right;   CERVICAL SPINE SURGERY  2009   COLONOSCOPY     ENDOMETRIAL ABLATION     INCISION AND DRAINAGE PERIRECTAL ABSCESS N/A 05/22/2023   Procedure: IRRIGATION AND DEBRIDEMENT PERIRECTAL ABSCESS AND DEBRIDEMENT OF INVOLVED TISSUES;  Surgeon: Moise Boring, MD;  Location: [facility] OR;  Service: General;  Laterality: N/A;   POLYPECTOMY     TUBAL LIGATION     tube ligation      Social History:  reports that she has never smoked. She has never used smokeless tobacco. She reports current alcohol use of about 1.0 - 2.0 standard drink of alcohol per week. She reports that she does not use drugs.  Allergies  Allergen Reactions   Compazine [Prochlorperazine Edisylate] Other (See Comments)    Muscle contractures     Family History  Problem Relation Age of Onset   Breast cancer Mother 77   Diabetes Mother    Hypertension Mother    Hyperlipidemia Mother     Obesity Mother    Diabetes Father    Hypertension Father    Hyperlipidemia Father    Ovarian cancer Maternal Grandmother    Colon cancer Neg Hx    Esophageal cancer Neg Hx    Rectal cancer Neg Hx    Stomach cancer Neg Hx     Prior to Admission medications   Medication Sig Start Date End Date Taking? Authorizing Provider  anastrozole (ARIMIDEX) 1 MG tablet TAKE 1 TABLET BY MOUTH EVERY DAY 01/13/23  Yes Pollyann Samples, NP  cholecalciferol (VITAMIN D3) 25 MCG (1000 UNIT) tablet Take 1,000 Units by mouth daily.   Yes [provider]  clindamycin (CLEOCIN) 300 MG capsule Take 300 mg by mouth 3 (three) times daily. 05/20/23  Yes [provider]  clonazePAM (KLONOPIN) 0.5 MG tablet Take 0.5 mg by mouth 2 (two) times daily as needed for anxiety.   Yes [provider]  escitalopram (LEXAPRO) 10 MG tablet Take 1 tablet (10 mg total) by mouth daily. 05/04/23 08/02/23 Yes Rayburn, Fanny Bien, PA-C  meloxicam (MOBIC) 15 MG tablet Take 15 mg by mouth daily.   Yes [provider]    Physical Exam: Vitals:   05/22/23 2000 05/22/23 2036 05/22/23 2319 05/23/23 0416  BP: 103/63 94/63 108/66 104/64  Pulse: 84 74 (!) 56 (!) 59  Resp: (!) 23 18    Temp: 98.7 F (37.1 C) 97.6 F (36.4 C)  97.6 F (36.4 C)  TempSrc:      SpO2: 94% 95% 98% 98%  Weight:      Height:        Constitutional: Older female currently in no acute distress. eyes: PERRL, lids and conjunctivae normal ENMT: Mucous membranes are moist.  Normal dentition.  Neck: normal, supple, no masses, no thyromegaly Respiratory: clear to auscultation bilaterally, no wheezing, no crackles. Normal respiratory effort. No accessory muscle use.  Cardiovascular: Regular rate and rhythm, no murmurs / rubs / gallops. No extremity edema. 2+ pedal pulses.  Abdomen: no tenderness, no masses palpated.   Bowel sounds positive.  Musculoskeletal: no clubbing / cyanosis. No joint deformity upper and lower  extremities. Good ROM, no contractures. Normal muscle tone.  Skin: Deferred inspection of postoperative wound. Neurologic: CN 2-12 grossly intact. Strength 5/5 in all 4.  Psychiatric: Normal judgment and insight. Alert and oriented x 3. Normal mood.   Data Reviewed:  Reviewed labs, imaging, and pertinent records as documented  Assessment and Plan:  Perirectal abscess Acute.  Patient presented with complaints of left-sided rectal pain transfered from [facility].  She had been given clindamycin, vancomycin, and Zosyn.  CT imaging concerning for left perirectal abscess with concern for necrotizing fasciitis.  General surgery to the patient for debridement yesterday evening.  Gram stain noted moderate WBCs present with both PMN and mononuclear, moderate gram-negative rods, and few gram-positive cocci.  General surgery had narrowed antibiotics down to Zosyn. -Admit to a MedSurg bed -Follow-up cultures -Continue antibiotics of Zosyn -Hydrocodone/fentanyl  IV as needed for moderate to severe pain respectively -Wound care  Leukocytosis Acute.  WBC was elevated at 17 at the side hospital.  Labs obtained yesterday noted WBC trending down at 14.1 with elevated neutrophil count.  Lactic acid was reassuring at 1.1.  Suspect secondary to above. -Continue to monitor  Normocytic anemia Acute.  Hemoglobin 11.2 g/dL.  Baseline hemoglobin previously noted to be around 12 -13 g/dL.  Suspect likely secondary to postoperative blood loss. -Recheck CBC  Prediabetes On admission glucose noted to be within normal limits at 99.  Last hemoglobin A1c was 5.7. -Continue outpatient follow-up  Anxiety -Continue Lexapro and Klonopin as needed  History of breast cancer Patient with prior history of right breast cancer status post lumpectomy -Continue anastrozole  Obesity BMI 33.83 kg/m -Continue outpatient follow-up with primary care regards to weight loss  DVT prophylaxis: Lovenox Advance Care  Planning:   Code Status: Full Code    Consults: General Surgery  Family Communication: Husband updated at bedside  Severity of Illness: The appropriate patient status for this patient is INPATIENT. Inpatient status is judged to be reasonable and necessary in order to provide the required intensity of service to ensure the patient's safety. The patient's presenting symptoms, physical exam findings, and initial radiographic and laboratory data in the context of their chronic comorbidities is felt to place them at high risk for further clinical deterioration. Furthermore, it is not anticipated that the patient will be medically stable for discharge from the hospital within 2 midnights of admission.   * I certify that at the point of admission it is my clinical judgment that the patient will require inpatient hospital care spanning beyond 2 midnights from the point of admission due to high intensity of service, high risk for further deterioration and high frequency of surveillance required.*  Author: Clydie Braun, MD 05/23/2023 7:05 AM  For on call review [email or web address].

## 2023-05-23 NOTE — Progress Notes (Signed)
[facility] Progress Note  1 Day Post-Op  Subjective: CC:  Pain controlled. Sitting up about to eat breakfast. 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 Her son is at the bedside.   Objective: Vital signs in last 24 hours: Temp:  [97.6 F (36.4 C)-99.8 F (37.7 C)] 97.6 F (36.4 C) (10/21 0416) Pulse Rate:  [56-87] 59 (10/21 0416) Resp:  [14-25] 18 (10/20 2036) BP: (94-123)/(55-70) 104/64 (10/21 0416) SpO2:  [94 %-100 %] 98 % (10/21 0416) Weight:  [86.6 kg] 86.6 kg (10/20 1449)    Intake/Output from previous day: 10/20 0701 - 10/21 0700 In: 722.6 [P.O.:120; I.V.:552.4; IV Piggyback:50.2] Out: 25 [Blood:25] Intake/Output this shift: No intake/output data recorded.  PE: Gen:  Alert, NAD, pleasant Pulm:  Normal effort ORA Abd: Soft, non-tender GU: there is a 3x2 cm skin opening without surrounding cellulitis or induration 1/4" iodoform packing was removed and the wound bed appeared healthy and pink, no bleeding.  Skin: warm and dry Psych: A&Ox3   Lab Results:  Recent Labs    05/22/23 1512  WBC 14.1*  HGB 11.2*  HCT 34.4*  PLT 266   BMET Recent Labs    05/22/23 1512  NA 134*  K 4.3  CL 101  CO2 23  GLUCOSE 99  BUN 10  CREATININE 0.66  CALCIUM 8.3*   PT/INR No results for input(s): "LABPROT", "INR" in the last 72 hours. CMP     Component Value Date/Time   NA 134 (L) 05/22/2023 1512   NA 142 05/26/2021 0803   K 4.3 05/22/2023 1512   CL 101 05/22/2023 1512   CO2 23 05/22/2023 1512   GLUCOSE 99 05/22/2023 1512   BUN 10 05/22/2023 1512   BUN 15 05/26/2021 0803   CREATININE 0.66 05/22/2023 1512   CREATININE 0.75 04/28/2023 0932   CALCIUM 8.3 (L) 05/22/2023 1512   PROT 6.3 (L) 05/22/2023 1512   PROT 7.2 05/26/2021 0803   ALBUMIN 2.4 (L) 05/22/2023 1512   ALBUMIN 4.4 05/26/2021 0803   AST 27 05/22/2023 1512   AST 15 04/28/2023 0932   ALT 24 05/22/2023 1512   ALT 15 04/28/2023 0932   ALKPHOS 83 05/22/2023 1512   BILITOT 1.2 05/22/2023 1512    BILITOT 0.6 04/28/2023 0932   GFRNONAA >60 05/22/2023 1512   GFRNONAA >60 04/28/2023 0932   GFRAA 106 05/20/2020 0930   GFRAA >60 04/25/2020 0920   Lipase  No results found for: "LIPASE"     Studies/Results: CT PELVIS WO CONTRAST  Result Date: 05/22/2023 CLINICAL DATA:  Evaluate for possible perirectal abscess and necrotizing fasciitis. EXAM: CT PELVIS WITHOUT CONTRAST TECHNIQUE: Multidetector CT imaging of the pelvis was performed following the standard protocol without intravenous contrast. RADIATION DOSE REDUCTION: This exam was performed according to the departmental dose-optimization program which includes automated exposure control, adjustment of the mA and/or kV according to patient size and/or use of iterative reconstruction technique. COMPARISON:  By report from CT from [facility] earlier today. FINDINGS: Urinary Tract: Bladder is well distended with contrast enhanced urine consistent with the recent CT examination. Bilateral excretion of contrast from the kidneys is noted. Bowel: Visualized large and small bowel show no acute abnormality. The appendix is within normal limits. Vascular/Lymphatic: Atherosclerotic calcifications are noted. No aneurysmal dilatation is seen. No significant lymphadenopathy is noted. Reproductive: The uterus is well visualized. 6 cm right-sided uterine fibroid is again identified and stable. No adnexal mass is noted. Other: No ascites is noted. Considerable perirectal stranding is noted which extends  inferiorly in the medial aspect of the left buttock and anteriorly along the ischio-anal fossa on the left similar to that seen on the prior exam. Multiple locules of air and strandy edema are noted. Some fullness is noted in the midline adjacent to the anus suspicious for loculated fluid collection although not well visualized due to the lack of contrast administration. This corresponds to the findings seen on the prior CT examination. Musculoskeletal: No  suspicious bone lesions identified. IMPRESSION: Persistent changes in the left medial buttock and extending into the ipsilateral ischio-anal fossa with multiple gas locules and stranding edema consistent with necrotizing infection and likely developing abscess. Uterine fibroid change Electronically Signed   By: Alcide Clever M.D.   On: 05/22/2023 17:33    Anti-infectives: Anti-infectives (From admission, onward)    Start     Dose/Rate Route Frequency Ordered Stop   05/22/23 2030  piperacillin-tazobactam (ZOSYN) IVPB 3.375 g        3.375 g 12.5 mL/hr over 240 Minutes Intravenous Every 8 hours 05/22/23 1904          Assessment/Plan Left perirectal abscess  S/p I&D, anorectal exam under anesthesia 10/20 Dr. Keenan Bachelor  POD#1 AFVSS Cx pending, GS w/ GNR, GPC Stable for discharge from a surgical perspective. Would recommend 5 days of augmentin at discharge. I will arrange post-operative follow up with Dr. Hillery Hunter in 2-3 weeks. Work note and pain Rx provided.   LOS: 1 day   I reviewed nursing notes, hospitalist notes, last 24 h vitals and pain scores, last 48 h intake and output, last 24 h labs and trends, and last 24 h imaging results.  This care required straight-forward level of medical decision making.   Hosie Spangle, PA-C [facility] Please see Amion for pager number during day hours 7:00am-4:30pm

## 2023-05-23 NOTE — Plan of Care (Signed)

## 2023-05-23 NOTE — Progress Notes (Signed)
   05/23/23 1431  Mobility  Activity Ambulated independently in hallway;Ambulated independently in room  Level of Assistance Independent  Distance Ambulated (ft) 550 ft  Activity Response Tolerated fair  Mobility Referral Yes  $Mobility charge 1 Mobility  Mobility Specialist Start Time (ACUTE ONLY) 1231  Mobility Specialist Stop Time (ACUTE ONLY) 1259  Mobility Specialist Time Calculation (min) (ACUTE ONLY) 28 min   Mobility Specialist: Progress Note  Pre-Mobility: HR 57, BP 108/66 Post-Mobility: HR 54, BP 114/67  Pt agreeable to mobility session - received in bed. Ambulated independently using RW. C/o slight dizziness or "fuzzy." Returned to bed with all needs met - call bell within reach.   Barnie Mort, BS Mobility Specialist Please contact via SecureChat or Rehab office at [phone].

## 2023-05-23 NOTE — Discharge Summary (Signed)
Physician Discharge Summary   Patient: Heather Hayes MRN: [id number] DOB: 19-Sep-1960  Admit date:     05/22/2023  Discharge date: 05/23/23  Discharge Physician: Clydie Braun   PCP: Noberto Retort, MD   Recommendations at discharge:   Please follow-up with general surgery Follow-up pending cultures Discharge Diagnoses: Principal Problem:   Perirectal abscess Active Problems:   Leukocytosis   Normocytic anemia   Prediabetes   Anxiety   History of breast cancer   Obesity (BMI 30-39.9)   Hospital Course: Heather Hayes is a 62 y.o. female with medical history significant of hyperlipidemia, prediabetes, breast cancer s/p lumpectomy, arthritis, anxiety, and obesity who presented as a transfer from [facility] for concern of left perirectal abscess.  She started feeling unwell with decreased appetite about 5 days ago.  She initially did not have any rectal pain and had tested herself for COVID due to her symptoms, but the test was negative.  She had gone to urgent care 3 days ago as she was not feeling any better.  The checked her for a urinary tract infection which was negative, but had prescribed her clindamycin.  Over the next couple days patient reported having fevers up to 102.4 F at home prior to arrival and development of pain with sitting.  She had gone to [facility] yesterday because of her symptoms where CT noted concern for possible necrotizing process.  She was given vancomycin and Zosyn prior to being transferred here for evaluation by general surgery.   In the emergency department patient was noted to be afebrile with tachypnea, but all other vital signs relatively maintained.  Labs from 10/20 significant for WBC 14.1, hemoglobin 11.2, sodium 134, albumin 2.4, and lactic acid 1.1.  CT scan of the abdomen pelvis noted persistent changes in the left medial buttock and extending into the ipsilateral ischio-anal fossa with multiple gas locules and stranding edema  consistent with necrotizing infection and likely developing abscess.  General surgery had been consulted and took the patient for surgery for debridement.  During the procedure there were no findings consistent with necrotizing infection.  Interoperative cultures were sent.  Antibiotics were narrowed down to Zosyn.  TRH called to admit.    Assessment and Plan: Perirectal abscess Acute.  Patient presented with complaints of left-sided rectal pain transfered from [facility].  She had been given clindamycin, vancomycin, and Zosyn.  CT imaging concerning for left perirectal abscess with concern for necrotizing fasciitis.  General surgery took the patient for debridement 10/20.  Gram stain noted moderate WBCs present with both PMN and mononuclear, moderate gram-negative rods, and few gram-positive cocci.  General surgery had narrowed antibiotics down to Zosyn.  General surgery felt patient was stable for discharge and to transition antibiotics to Augmentin to complete 5-day course.  Recommended no packing of the wound and for her to continue taking sitz bath's.  Leukocytosis Acute.  WBC 14.1->14.3.  Suspect secondary to above  Normocytic anemia Acute.  Hemoglobin 11.2 g/dL.  Baseline hemoglobin previously noted to be around 12 -13 g/dL.  Suspect likely secondary to postoperative blood loss.  Repeat hemoglobin was 11.4. -Recommend patient follow-up in outpatient setting with primary care provider for repeat labs    Prediabetes On admission glucose noted to be within normal limits at 99.  Last hemoglobin A1c was 5.7. -Continue outpatient follow-up with PCP   Anxiety -Continue Lexapro and Klonopin as needed   History of breast cancer Patient with prior history of right breast cancer  status post lumpectomy -Continue anastrozole   Obesity BMI 33.83 kg/m -Continue outpatient follow-up with primary care regards to weight loss      Consultants: General surgery Procedures performed: I&D  10/20 Disposition: Home Diet recommendation:  Regular diet DISCHARGE MEDICATION: Allergies as of 05/23/2023       Reactions   Compazine [prochlorperazine Edisylate] Other (See Comments)   Muscle contractures        Medication List     STOP taking these medications    clindamycin 300 MG capsule Commonly known as: CLEOCIN       TAKE these medications    amoxicillin-clavulanate 875-125 MG tablet Commonly known as: AUGMENTIN Take 1 tablet by mouth 2 (two) times daily for 5 days.   anastrozole 1 MG tablet Commonly known as: ARIMIDEX TAKE 1 TABLET BY MOUTH EVERY DAY   cholecalciferol 25 MCG (1000 UNIT) tablet Commonly known as: VITAMIN D3 Take 1,000 Units by mouth daily.   clonazePAM 0.5 MG tablet Commonly known as: KLONOPIN Take 0.5 mg by mouth 2 (two) times daily as needed for anxiety.   escitalopram 10 MG tablet Commonly known as: Lexapro Take 1 tablet (10 mg total) by mouth daily.   HYDROcodone-acetaminophen 5-325 MG tablet Commonly known as: NORCO/VICODIN Take 1 tablet by mouth every 6 (six) hours as needed for moderate pain (pain score 4-6) (pain not relived by tylenol/advil).   meloxicam 15 MG tablet Commonly known as: MOBIC Take 15 mg by mouth daily.        Follow-up Information     Moise Boring, MD Follow up.   Specialty: General Surgery Why: our office is scheduling you for post-operative follow up. please call to confirm appointment date/time. Contact information: [address] [place] [address] [phone]         Noberto Retort, MD. Call in 1 week(s).   Specialty: Family Medicine Why: As needed Contact information: [address] [place] [address] [phone]                Discharge Exam: Ceasar Mons Weights   05/22/23 1449  Weight: 86.6 kg     Constitutional:older female currently no acute distress Eyes: PERRL, lids and conjunctivae normal ENMT: Mucous membranes are  moist  Neck: normal, supple,   Respiratory: clear to auscultation bilaterally, no wheezing, no crackles. Normal respiratory effort. No accessory muscle use.  Cardiovascular: Regular rate and rhythm, no murmurs / rubs / gallops. No extremity edema. 2+ pedal pulses.   Abdomen: no tenderness, no masses palpated. Bowel sounds positive.  Musculoskeletal: no clubbing / cyanosis. No joint deformity upper and lower extremities. Good ROM, no contractures. Normal muscle tone.  Skin: no rashes, lesions, ulcers. No induration Neurologic: CN 2-12 grossly intact. Sensation intact, DTR normal. Strength 5/5 in all 4.  Psychiatric: Normal judgment and insight. Alert and oriented x 3. Normal mood.    Condition at discharge: good  The results of significant diagnostics from this hospitalization (including imaging, microbiology, ancillary and laboratory) are listed below for reference.   Imaging Studies: CT PELVIS WO CONTRAST  Result Date: 05/22/2023 CLINICAL DATA:  Evaluate for possible perirectal abscess and necrotizing fasciitis. EXAM: CT PELVIS WITHOUT CONTRAST TECHNIQUE: Multidetector CT imaging of the pelvis was performed following the standard protocol without intravenous contrast. RADIATION DOSE REDUCTION: This exam was performed according to the departmental dose-optimization program which includes automated exposure control, adjustment of the mA and/or kV according to patient size and/or use of iterative reconstruction technique.  COMPARISON:  By report from CT from [facility] earlier today. FINDINGS: Urinary Tract: Bladder is well distended with contrast enhanced urine consistent with the recent CT examination. Bilateral excretion of contrast from the kidneys is noted. Bowel: Visualized large and small bowel show no acute abnormality. The appendix is within normal limits. Vascular/Lymphatic: Atherosclerotic calcifications are noted. No aneurysmal dilatation is seen. No significant lymphadenopathy is  noted. Reproductive: The uterus is well visualized. 6 cm right-sided uterine fibroid is again identified and stable. No adnexal mass is noted. Other: No ascites is noted. Considerable perirectal stranding is noted which extends inferiorly in the medial aspect of the left buttock and anteriorly along the ischio-anal fossa on the left similar to that seen on the prior exam. Multiple locules of air and strandy edema are noted. Some fullness is noted in the midline adjacent to the anus suspicious for loculated fluid collection although not well visualized due to the lack of contrast administration. This corresponds to the findings seen on the prior CT examination. Musculoskeletal: No suspicious bone lesions identified. IMPRESSION: Persistent changes in the left medial buttock and extending into the ipsilateral ischio-anal fossa with multiple gas locules and stranding edema consistent with necrotizing infection and likely developing abscess. Uterine fibroid change Electronically Signed   By: Alcide Clever M.D.   On: 05/22/2023 17:33    Microbiology: Results for orders placed or performed during the hospital encounter of 05/22/23  Aerobic/Anaerobic Culture w Gram Stain (surgical/deep wound)     Status: None (Preliminary result)   Collection Time: 05/22/23  6:04 PM   Specimen: Path fluid; Tissue  Result Value Ref Range Status   Specimen Description ABSCESS  Final   Special Requests left gluteal  Final   Gram Stain   Final    MODERATE WBC PRESENT,BOTH PMN AND MONONUCLEAR MODERATE GRAM NEGATIVE RODS FEW GRAM POSITIVE COCCI    Culture   Final    MODERATE GRAM NEGATIVE RODS CULTURE REINCUBATED FOR BETTER GROWTH Performed at [facility], [address], [place], [place] [address]    Report Status PENDING  Incomplete    Labs: CBC: Recent Labs  Lab 05/22/23 1512 05/23/23 0918  WBC 14.1* 14.3*  NEUTROABS 11.6* 12.6*  HGB 11.2* 11.4*  HCT 34.4* 34.2*  MCV 92.7 93.2  PLT 266 315   Basic  Metabolic Panel: Recent Labs  Lab 05/22/23 1512 05/23/23 0918  NA 134* 137  K 4.3 4.1  CL 101 110  CO2 23 20*  GLUCOSE 99 176*  BUN 10 15  CREATININE 0.66 0.66  CALCIUM 8.3* 8.1*  MG  --  2.3   Liver Function Tests: Recent Labs  Lab 05/22/23 1512 05/23/23 0918  AST 27 21  ALT 24 29  ALKPHOS 83 86  BILITOT 1.2 0.6  PROT 6.3* 6.2*  ALBUMIN 2.4* 2.3*   CBG: No results for input(s): "GLUCAP" in the last 168 hours.  Discharge time spent: less 30 minutes.  Signed: Clydie Braun, MD [facility] 05/23/2023

## 2023-05-23 NOTE — Progress Notes (Signed)
  Carryover admission to the Day Admitter.   This is a 62 year old female who is being admitted postoperatively to [facility] with perirectal abscess.  This evening, she underwent I&D/debridement under general anesthesia with Dr.Metzger of general surgery.   I have placed an order for inpatient admission to MedSurg for further evaluation management of the above.  I have placed some additional preliminary admit orders via the adult multi-morbid admission order set. I have also ordered continuation of existing order for Zosyn.  Have also ordered prn IV fentanyl, prn IV Zofran, as well as lactated Ringer's at 75 cc/h x 8 hours.  I have also ordered morning labs in the form of CMP, CBC, and serum magnesium level.  As the patient is postop, also placed order for incentive spirometry.    Newton Pigg, DO Hospitalist

## 2023-05-27 LAB — AEROBIC/ANAEROBIC CULTURE W GRAM STAIN (SURGICAL/DEEP WOUND)

## 2023-06-04 DIAGNOSIS — G4733 Obstructive sleep apnea (adult) (pediatric): Secondary | ICD-10-CM | POA: Diagnosis not present

## 2023-06-08 ENCOUNTER — Ambulatory Visit (INDEPENDENT_AMBULATORY_CARE_PROVIDER_SITE_OTHER): Payer: BC Managed Care – PPO | Admitting: Physician Assistant

## 2023-06-09 ENCOUNTER — Encounter (INDEPENDENT_AMBULATORY_CARE_PROVIDER_SITE_OTHER): Payer: Self-pay | Admitting: Physician Assistant

## 2023-07-04 ENCOUNTER — Encounter (INDEPENDENT_AMBULATORY_CARE_PROVIDER_SITE_OTHER): Payer: Self-pay | Admitting: Physician Assistant

## 2023-07-04 ENCOUNTER — Ambulatory Visit (INDEPENDENT_AMBULATORY_CARE_PROVIDER_SITE_OTHER): Payer: BC Managed Care – PPO | Admitting: Physician Assistant

## 2023-07-04 VITALS — BP 119/75 | HR 58 | Temp 98.5°F | Ht 63.0 in | Wt 193.0 lb

## 2023-07-04 DIAGNOSIS — R7303 Prediabetes: Secondary | ICD-10-CM | POA: Diagnosis not present

## 2023-07-04 DIAGNOSIS — E7849 Other hyperlipidemia: Secondary | ICD-10-CM

## 2023-07-04 DIAGNOSIS — Z8719 Personal history of other diseases of the digestive system: Secondary | ICD-10-CM

## 2023-07-04 DIAGNOSIS — E669 Obesity, unspecified: Secondary | ICD-10-CM

## 2023-07-04 DIAGNOSIS — G4733 Obstructive sleep apnea (adult) (pediatric): Secondary | ICD-10-CM | POA: Diagnosis not present

## 2023-07-04 DIAGNOSIS — Z6834 Body mass index (BMI) 34.0-34.9, adult: Secondary | ICD-10-CM

## 2023-07-04 DIAGNOSIS — E559 Vitamin D deficiency, unspecified: Secondary | ICD-10-CM | POA: Diagnosis not present

## 2023-07-04 DIAGNOSIS — E785 Hyperlipidemia, unspecified: Secondary | ICD-10-CM

## 2023-07-04 DIAGNOSIS — K5904 Chronic idiopathic constipation: Secondary | ICD-10-CM | POA: Diagnosis not present

## 2023-07-04 DIAGNOSIS — R5383 Other fatigue: Secondary | ICD-10-CM | POA: Diagnosis not present

## 2023-07-04 DIAGNOSIS — F3289 Other specified depressive episodes: Secondary | ICD-10-CM

## 2023-07-04 NOTE — Progress Notes (Signed)
SUBJECTIVE:  Chief Complaint: Obesity  Interim History: The patient, a 62 year old female with a history of prediabetes, obesity, emotional eating behaviors, and chronic idiopathic constipation, presents for a follow-up visit regarding her obesity treatment plan.  The patient recently underwent drainage for a perirectal abscess and has been healing well under the care of her surgeon. The patient reports that the incision site is nearly healed and has stopped draining.  Previously, the patient was on Ozempic for her prediabetes, but this was discontinued due to complications with her perirectal abscess and significant constipation issues. The patient reports feeling better since discontinuing the medication, which had caused her to feel nauseated.  The patient's gastrointestinal system was significantly disrupted following her recent medical procedures, resulting in an increase in bowel movements. However, the patient reports that her bowel movements have since normalized. She has been taking a probiotic, Restora, which she believes has helped with her constipation.  The patient has been focusing on improving her diet, incorporating more proteins and planning her meals better. She reports feeling like she is getting back on track with her health. The patient has also been engaging in physical activity, including walking her new dog.  Heather Hayes is here to discuss her progress with her obesity treatment plan. She is on the Category 2 Plan and states she is following her eating plan approximately 50 % of the time. She states she is exercising walking dogs- 5000-7000 30 minutes 7 times per week.  Pharmacotherapy: Previously on Rybelsus and then briefly on Ozempic- Developed peri- rectal abscess and all medications stopped and she would like to remain off of medications for now.   Fasting labs were obtained today.  She was informed we would discuss her lab results at her next visit unless there is a  critical issue that needs to be addressed sooner. She agreed to keep her next visit at the agreed upon time to discuss these results.   OBJECTIVE: Visit Diagnoses: Problem List Items Addressed This Visit     Vitamin D deficiency   Relevant Orders   VITAMIN D 25 Hydroxy (Vit-D Deficiency, Fractures)   Generalized obesity   Depression   Prediabetes - Primary   Relevant Orders   CMP14+EGFR   Hemoglobin A1c   Insulin, random   Other Visit Diagnoses     Chronic idiopathic constipation       Other hyperlipidemia       Relevant Orders   Lipid Panel With LDL/HDL Ratio   Other fatigue       Relevant Orders   Vitamin B12   CBC with Differential/Platelet   TSH        Obesity 62 year old with obesity, prediabetes, and emotional eating behaviors. Previously on Ozempic and Rybelsus, discontinued due to side effects. Currently not on any medication for weight management. Reports feeling better, focusing on dietary changes and increased protein intake to aid in wound healing. Emphasized the role of family support in managing emotional eating. - Encourage high protein diet to facilitate wound healing.  - Consider protein supplements such as Premier or Core Life - Recommend extra vitamin C (500 mg twice daily) and zinc (once daily) - Discuss strategies to manage emotional eating and involve family support  Perirectal Abscess (Post-Drainage) Perirectal abscess drained on 05/22/2023. Healing slower than expected, but wound is now scabbing and drainage has decreased. Discussed the importance of high protein intake and supplementation with vitamin C and zinc for wound healing. - Follow up with surgeon Dr. Hillery Hunter in 8  weeks from last visit - if needed if wound not healing.  - Encourage high protein diet - Consider extra vitamin C and zinc supplementation  Chronic Idiopathic Constipation Chronic idiopathic constipation, exacerbated by recent antibiotic use. Currently taking Restora probiotic,  which has helped alleviate symptoms. - Continue Restora probiotic Continue to monitor closely.   Prediabetes Last A1c was 5.7- not at goal, Insulin 6.4- nearing goal of < 5  Medication(s): None Polyphagia:No She is getting back on track with her nutrition plan and exercise.  Lab Results  Component Value Date   HGBA1C 5.7 10/20/2022   HGBA1C 5.5 11/09/2021   HGBA1C 5.5 05/26/2021   HGBA1C 5.6 09/29/2020   HGBA1C 5.5 01/22/2020   Lab Results  Component Value Date   INSULIN 6.4 11/09/2021   INSULIN 6.7 05/26/2021   INSULIN 6.5 09/29/2020   INSULIN 8.0 05/20/2020   INSULIN 7.5 01/22/2020    Plan:  Continue working on nutrition plan to decrease simple carbohydrates, increase lean proteins and exercise to promote weight loss, improve glycemic control and prevent progression to Type 2 diabetes.  Recheck fasting A1c, insulin level and CMET  Vitamin D Deficiency Vitamin D is at goal of 50.  Most recent vitamin D level was 81.0. She is on OTC vitamin D3 1000 IU daily. Lab Results  Component Value Date   VD25OH 81.0 11/09/2021   VD25OH 86.4 05/26/2021   VD25OH 77.6 09/29/2020    Plan: Continue OTC vitamin D3 1000 IU daily Low vitamin D levels can be associated with adiposity and may result in leptin resistance and weight gain. Also associated with fatigue. Currently on vitamin D supplementation without any adverse effects.  Recheck Vitamin D level today and adjust supplementation accordingly to optimize supplementation/avoid over supplementation.   Hyperlipidemia LDL is at goal. ? HDL level / Elevated triglycerides.  Medication(s): None Cardiovascular risk factors: dyslipidemia and obesity (BMI >= 30 kg/m2)  Lab Results  Component Value Date   CHOL 209 (A) 10/20/2022   HDL 3 (A) 10/20/2022   LDLCALC 93 10/20/2022   TRIG 262 (A) 10/20/2022   CHOLHDL 2.8 05/20/2020   CHOLHDL 3.1 09/05/2019   Lab Results  Component Value Date   ALT 29 05/23/2023   AST 21 05/23/2023    ALKPHOS 86 05/23/2023   BILITOT 0.6 05/23/2023   The ASCVD Risk score (Arnett DK, et al., 2019) failed to calculate for the following reasons:   The valid HDL cholesterol range is 0.517 to 2.586 mmol/L  Plan: Continue to work on nutrition plan -decreasing simple carbohydrates, increasing lean proteins, decreasing saturated fats and cholesterol , avoiding trans fats and exercise as able to promote weight loss, improve lipids and decrease cardiovascular risks. Recheck fasting lipid panel today.  May benefit from statin therapy.    Eating disorder/emotional eating Heather Hayes has had issues with stress/emotional eating. Currently this is moderately controlled. Overall mood is stable. Medication(s): Other: Lexapro 10 mg daily  Plan: Continue Other: lexapro 10 mg daily Continue emotional eating behavior strategies.   Other fatigue: Endorses getting energry back after recent hospitalization for peri rectal abscess overall, but still some fatigue.  Plan:  Recheck CBC, B 12 and Vitamin D and TSH levels.    General Health Maintenance Due for routine lab work and screenings. Discussed the importance of monitoring various health parameters given the history of prediabetes and recent health issues. - Order CBC, liver function tests, kidney function tests, electrolytes panel, HbA1c, fasting insulin, lipid panel, thyroid function tests, and recheck vitamin D  levels  Follow-up - Schedule follow-up appointment after lab results are available.  Vitals Temp: 98.5 F (36.9 C) BP: 119/75 Pulse Rate: (!) 58 SpO2: 100 %   Anthropometric Measurements Height: 5\' 3"  (1.6 m) Weight: 193 lb (87.5 kg) BMI (Calculated): 34.2 Weight at Last Visit: 191 lb Weight Lost Since Last Visit: 0 Weight Gained Since Last Visit: 2 lb Starting Weight: 191 lb Total Weight Loss (lbs): 0 lb (0 kg) Peak Weight: 196 lb   Body Composition  Body Fat %: 44.9 % Fat Mass (lbs): 87 lbs Muscle Mass (lbs): 101.4  lbs Total Body Water (lbs): 72.4 lbs Visceral Fat Rating : 13   Other Clinical Data Fasting: yes Labs: no Today's Visit #: 52 Starting Date: 05/15/19     ASSESSMENT AND PLAN:  Diet: Nijae is currently in the action stage of change. As such, her goal is to continue with weight loss efforts. She has agreed to Category 2 Plan.  Exercise: Adiyah has been instructed to work up to a goal of 150 minutes of combined cardio and strengthening exercise per week for weight loss and overall health benefits.   Behavior Modification:  We discussed the following Behavioral Modification Strategies today: increasing lean protein intake, decreasing simple carbohydrates, increasing vegetables, increase H2O intake, increase high fiber foods, meal planning and cooking strategies, emotional eating strategies , avoiding temptations, and planning for success. We discussed various medication options to help Amme with her weight loss efforts and we both agreed to continue to work on nutritional and behavioral strategies to promote weight loss.  She would like to try to proceed without medication at this point. .  Return in about 4 weeks (around 08/01/2023).Marland Kitchen She was informed of the importance of frequent follow up visits to maximize her success with intensive lifestyle modifications for her multiple health conditions.  Attestation Statements:   Reviewed by clinician on day of visit: allergies, medications, problem list, medical history, surgical history, family history, social history, and previous encounter notes.   Time spent on visit including pre-visit chart review and post-visit care and charting was 42 minutes.    Heather Reierson, PA-C

## 2023-07-06 DIAGNOSIS — Z01419 Encounter for gynecological examination (general) (routine) without abnormal findings: Secondary | ICD-10-CM | POA: Diagnosis not present

## 2023-07-06 DIAGNOSIS — Z6835 Body mass index (BMI) 35.0-35.9, adult: Secondary | ICD-10-CM | POA: Diagnosis not present

## 2023-07-06 LAB — CBC WITH DIFFERENTIAL/PLATELET
Basophils Absolute: 0.1 10*3/uL (ref 0.0–0.2)
Basos: 1 %
EOS (ABSOLUTE): 0.1 10*3/uL (ref 0.0–0.4)
Eos: 2 %
Hematocrit: 39.3 % (ref 34.0–46.6)
Hemoglobin: 12.6 g/dL (ref 11.1–15.9)
Immature Grans (Abs): 0 10*3/uL (ref 0.0–0.1)
Immature Granulocytes: 1 %
Lymphocytes Absolute: 1.7 10*3/uL (ref 0.7–3.1)
Lymphs: 27 %
MCH: 31 pg (ref 26.6–33.0)
MCHC: 32.1 g/dL (ref 31.5–35.7)
MCV: 97 fL (ref 79–97)
Monocytes Absolute: 0.4 10*3/uL (ref 0.1–0.9)
Monocytes: 6 %
Neutrophils Absolute: 3.8 10*3/uL (ref 1.4–7.0)
Neutrophils: 63 %
Platelets: 322 10*3/uL (ref 150–450)
RBC: 4.07 x10E6/uL (ref 3.77–5.28)
RDW: 13.6 % (ref 11.7–15.4)
WBC: 6 10*3/uL (ref 3.4–10.8)

## 2023-07-06 LAB — CMP14+EGFR
ALT: 18 [IU]/L (ref 0–32)
AST: 19 [IU]/L (ref 0–40)
Albumin: 4.3 g/dL (ref 3.9–4.9)
Alkaline Phosphatase: 112 [IU]/L (ref 44–121)
BUN/Creatinine Ratio: 24 (ref 12–28)
BUN: 16 mg/dL (ref 8–27)
Bilirubin Total: 0.4 mg/dL (ref 0.0–1.2)
CO2: 21 mmol/L (ref 20–29)
Calcium: 9.5 mg/dL (ref 8.7–10.3)
Chloride: 100 mmol/L (ref 96–106)
Creatinine, Ser: 0.66 mg/dL (ref 0.57–1.00)
Globulin, Total: 2.8 g/dL (ref 1.5–4.5)
Glucose: 98 mg/dL (ref 70–99)
Potassium: 4.7 mmol/L (ref 3.5–5.2)
Sodium: 140 mmol/L (ref 134–144)
Total Protein: 7.1 g/dL (ref 6.0–8.5)
eGFR: 99 mL/min/{1.73_m2} (ref >59)

## 2023-07-06 LAB — VITAMIN D 25 HYDROXY (VIT D DEFICIENCY, FRACTURES): Vit D, 25-Hydroxy: 39.6 ng/mL (ref 30.0–100.0)

## 2023-07-06 LAB — LIPID PANEL WITH LDL/HDL RATIO
Cholesterol, Total: 244 mg/dL — ABNORMAL HIGH (ref 100–199)
HDL: 103 mg/dL (ref >39)
LDL Chol Calc (NIH): 127 mg/dL — ABNORMAL HIGH (ref 0–99)
LDL/HDL Ratio: 1.2 {ratio} (ref 0.0–3.2)
Triglycerides: 85 mg/dL (ref 0–149)
VLDL Cholesterol Cal: 14 mg/dL (ref 5–40)

## 2023-07-06 LAB — HEMOGLOBIN A1C
Est. average glucose Bld gHb Est-mCnc: 120 mg/dL
Hgb A1c MFr Bld: 5.8 % — ABNORMAL HIGH (ref 4.8–5.6)

## 2023-07-06 LAB — INSULIN, RANDOM: INSULIN: 10.6 u[IU]/mL (ref 2.6–24.9)

## 2023-07-06 LAB — VITAMIN B12: Vitamin B-12: 430 pg/mL (ref 232–1245)

## 2023-07-06 LAB — TSH: TSH: 0.769 u[IU]/mL (ref 0.450–4.500)

## 2023-07-25 DIAGNOSIS — D485 Neoplasm of uncertain behavior of skin: Secondary | ICD-10-CM | POA: Diagnosis not present

## 2023-07-25 DIAGNOSIS — L814 Other melanin hyperpigmentation: Secondary | ICD-10-CM | POA: Diagnosis not present

## 2023-07-25 DIAGNOSIS — L578 Other skin changes due to chronic exposure to nonionizing radiation: Secondary | ICD-10-CM | POA: Diagnosis not present

## 2023-07-25 DIAGNOSIS — L820 Inflamed seborrheic keratosis: Secondary | ICD-10-CM | POA: Diagnosis not present

## 2023-08-02 DIAGNOSIS — G4733 Obstructive sleep apnea (adult) (pediatric): Secondary | ICD-10-CM | POA: Diagnosis not present

## 2023-08-04 DIAGNOSIS — G4733 Obstructive sleep apnea (adult) (pediatric): Secondary | ICD-10-CM | POA: Diagnosis not present

## 2023-08-08 NOTE — Progress Notes (Signed)
 SUBJECTIVE: Discussed the use of AI scribe software for clinical note transcription with the patient, who gave verbal consent to proceed.  Chief Complaint: Obesity  Interim History: She is up 2 lbs since her last visit.  Bio impedence scale reviewed with the patient:  Muscle mass + 1.6 lbs Adipose mass + 0.2 lbs Total body water - 0.2 lbs  Heather Hayes is a 63 yo female with a history of prediabetes, hyperlipidemia, vitamin D  deficiency, and emotional eating, presents for a follow-up visit regarding her obesity treatment plan. She recently underwent surgical drainage of a perirectal abscess and has been under the care of a surgical team for this issue. The patient reports that the surgical site has healed well, with only minor residual scabbing. She also mentions a sensation of a 'tunnel' under the skin near the scar, but this was evaluated by the surgeon and deemed not concerning.  The patient acknowledges a recent weight gain, which she attributes to holiday indulgences and increased stress at work due to temporarily covering a colleague's responsibilities. She expresses a desire to refocus on her weight loss plan and has been making efforts to increase her physical activity, including walking and using a stationary bike when possible. However, she also notes challenges such as workplace temptations, cravings for sweet and salty foods, and evening snacking.  The patient has a strong family history of type 2 diabetes and is motivated to avoid this diagnosis. She has been monitoring her blood sugar levels and is aware of a recent increase in her A1C. She expresses a commitment to reducing simple carbohydrates and sugars in her diet to manage this. She also mentions a recent cessation of alcohol consumption, which she had been using to cope with work stress.  The patient is currently on Lexapro  and vitamin D  supplements. She reports a generally low energy level, which she attributes to her busy  lifestyle and ongoing health concerns. She also mentions having arthritis in her right knee and bursitis in her hips, which sometimes limit her physical activity. Despite these challenges, the patient remains committed to her weight loss goals and is actively seeking strategies to improve her health. Heather Hayes is here to discuss her progress with her obesity treatment plan. She is on the Category 2 Plan and states she is following her eating plan approximately 50 % of the time. She states she is exercising walking/beach walking .    OBJECTIVE: Visit Diagnoses: Problem List Items Addressed This Visit     Vitamin D  deficiency   Generalized obesity   Depression   Relevant Medications   escitalopram  (LEXAPRO ) 10 MG tablet   Prediabetes - Primary   Other Visit Diagnoses       Other hyperlipidemia         BMI 34.0-34.9,adult Current BMI 34.6         Obesity   Gained 0.2 pounds of adipose tissue but increased muscle mass by 1.6 pounds.  Increased physical activity, including walking. Managing workplace temptations and evening snacking. Emotional eating and significant work stress leading to increased alcohol consumption, but she has stopped drinking any alcohol at this point. Discussed risks of alcohol consumption, including extra calories and poor sleep quality. Benefits of physical activity and healthy snacking emphasized.  Alternatives like BariWise hot chocolate and Fairlife chocolate milk discussed for low-calorie, high-protein snacks.   - Encourage continued physical activity, including walking and using a stationary bike.   - Recommend planning healthy snacks and meals to avoid workplace temptations.   -  Advise against alcohol consumption for stress management.   - Suggest trying Bari-Wise hot chocolate or Fairlife chocolate milk for a low-calorie, high-protein snack.   - Follow up in 4 weeks.    Prediabetes   Lab Results  Component Value Date   HGBA1C 5.8 (H) 07/04/2023   HGBA1C 5.7  10/20/2022   HGBA1C 5.5 11/09/2021   Lab Results  Component Value Date   LDLCALC 127 (H) 07/04/2023   CREATININE 0.66 07/04/2023    A1c increased, likely due to recent infection and holiday eating. Insulin  level slightly elevated at 10.6. Family history of type 2 diabetes. Discussed risks of progression to diabetes and benefits of reducing simple carbohydrates and sugars.  - Recheck A1c in a few months.   - Advise on reducing simple carbohydrates and sugars.   Continue working on nutrition plan to decrease simple carbohydrates, increase lean proteins and exercise to promote weight loss, improve glycemic control and prevent progression to Type 2 diabetes.   Hyperlipidemia   Cholesterol levels highest she has been. Prefers to avoid statin therapy due to family history of statin intolerance. Discussed risks of high cholesterol and benefits of reducing red meat intake and opting for lean proteins. Alternatives like fish oil supplements discussed.   - Recommend reducing red meat intake and opting for lean proteins like ground turkey.   - Consider fish oil supplements.   - Monitor cholesterol levels closely.   Continue to work on nutrition plan -decreasing simple carbohydrates, increasing lean proteins, decreasing saturated fats and cholesterol , avoiding trans fats and exercise as able to promote weight loss, improve lipids and decrease cardiovascular risks. Patient wants to work on nutrition/exercise prior to considering starting statin therapy.   Other depression/emotional eating Heather Hayes has had issues with stress/emotional eating. Currently this is moderately controlled. Overall mood is stable. Medication(s): Other: Lexapro  10 mg daily. No side effects with Lexapro   Plan: Continue and refill Other: Lexapro  10 mg daily.  Continue working on emotional eating strategies.   Perirectal Abscess (Post-Operative)   Had perirectal abscess drained in the OR. Healing progressing well with minor  residual skin issues. Discussed anticipated outcome of complete healing and no further complications.   - Follow up with [facility] as needed.    Vitamin D  Deficiency   Currently taking OTC vitamin D  supplements 1000 units daily. Vitamin D  levels within normal range. Discussed benefits of continued supplementation.   - Continue current vitamin D  supplementation.   Low vitamin D  levels can be associated with adiposity and may result in leptin resistance and weight gain. Also associated with fatigue. Currently on vitamin D  supplementation without any adverse effects.   General Health Maintenance   63 years old. Recent blood work done. Electrolytes, kidney, and liver functions normal. White blood cell count, hemoglobin, hematocrit, and platelets within normal range. TSH normal. Discussed benefits of B12 supplementation and stress management techniques like bedtime yoga.   - Recommend B12 supplementation with 500 micrograms sublingual tablets.   - Encourage regular physical activity and stress management techniques like bedtime yoga.    Follow-up   - Schedule follow-up appointment in 4 weeks.  Vitals Temp: 97.9 F (36.6 C) BP: 121/79 Pulse Rate: (!) 58 SpO2: 97 %   Anthropometric Measurements Height: 5' 3 (1.6 m) Weight: 195 lb (88.5 kg) BMI (Calculated): 34.55 Weight at Last Visit: 193 lb Weight Lost Since Last Visit: 0 Weight Gained Since Last Visit: 2 lb Starting Weight: 191 lb Total Weight Loss (lbs): 0 lb (0  kg) Peak Weight: 196 lb   Body Composition  Body Fat %: 44.6 % Fat Mass (lbs): 87.2 lbs Muscle Mass (lbs): 103 lbs Total Body Water (lbs): 72.2 lbs Visceral Fat Rating : 13   Other Clinical Data Fasting: No Labs: No Today's Visit #: 72 Starting Date: 05/15/19     ASSESSMENT AND PLAN:  Diet: Heather Hayes is currently in the action stage of change. As such, her goal is to continue with weight loss efforts. She has agreed to Category 2  Plan.  Exercise: Heather Hayes has been instructed to work up to a goal of 150 minutes of combined cardio and strengthening exercise per week for weight loss and overall health benefits.   Behavior Modification:  We discussed the following Behavioral Modification Strategies today: increasing lean protein intake, decreasing simple carbohydrates, increasing vegetables, increase H2O intake, increase high fiber foods, meal planning and cooking strategies, better snacking choices, emotional eating strategies , and planning for success. We discussed various medication options to help Heather Hayes with her weight loss efforts and we both agreed to continue to work on nutritional and behavioral strategies to promote weight loss and continue Lexapro  for emotional eating behaviors. .  Return in about 4 weeks (around 09/06/2023).Heather Hayes She was informed of the importance of frequent follow up visits to maximize her success with intensive lifestyle modifications for her multiple health conditions.  Attestation Statements:   Reviewed by clinician on day of visit: allergies, medications, problem list, medical history, surgical history, family history, social history, and previous encounter notes.   Time spent on visit including pre-visit chart review and post-visit care and charting was 50 minutes.    Heather Held, PA-C

## 2023-08-09 ENCOUNTER — Encounter (INDEPENDENT_AMBULATORY_CARE_PROVIDER_SITE_OTHER): Payer: Self-pay | Admitting: Physician Assistant

## 2023-08-09 ENCOUNTER — Ambulatory Visit (INDEPENDENT_AMBULATORY_CARE_PROVIDER_SITE_OTHER): Payer: BC Managed Care – PPO | Admitting: Physician Assistant

## 2023-08-09 VITALS — BP 121/79 | HR 58 | Temp 97.9°F | Ht 63.0 in | Wt 195.0 lb

## 2023-08-09 DIAGNOSIS — R7303 Prediabetes: Secondary | ICD-10-CM | POA: Diagnosis not present

## 2023-08-09 DIAGNOSIS — F3289 Other specified depressive episodes: Secondary | ICD-10-CM

## 2023-08-09 DIAGNOSIS — E7849 Other hyperlipidemia: Secondary | ICD-10-CM

## 2023-08-09 DIAGNOSIS — F32A Depression, unspecified: Secondary | ICD-10-CM

## 2023-08-09 DIAGNOSIS — E559 Vitamin D deficiency, unspecified: Secondary | ICD-10-CM | POA: Diagnosis not present

## 2023-08-09 DIAGNOSIS — E669 Obesity, unspecified: Secondary | ICD-10-CM

## 2023-08-09 DIAGNOSIS — Z6834 Body mass index (BMI) 34.0-34.9, adult: Secondary | ICD-10-CM

## 2023-08-09 MED ORDER — ESCITALOPRAM OXALATE 10 MG PO TABS: 10.0000 mg | ORAL_TABLET | Freq: Every day | ORAL | 0 refills | Status: DC

## 2023-08-16 DIAGNOSIS — R051 Acute cough: Secondary | ICD-10-CM | POA: Diagnosis not present

## 2023-08-16 DIAGNOSIS — R0981 Nasal congestion: Secondary | ICD-10-CM | POA: Diagnosis not present

## 2023-08-24 ENCOUNTER — Other Ambulatory Visit: Payer: Self-pay | Admitting: Nurse Practitioner

## 2023-09-06 ENCOUNTER — Ambulatory Visit (INDEPENDENT_AMBULATORY_CARE_PROVIDER_SITE_OTHER): Payer: BC Managed Care – PPO | Admitting: Physician Assistant

## 2023-09-06 ENCOUNTER — Encounter (INDEPENDENT_AMBULATORY_CARE_PROVIDER_SITE_OTHER): Payer: Self-pay | Admitting: Physician Assistant

## 2023-09-06 VITALS — BP 129/72 | HR 60 | Temp 97.8°F | Ht 63.0 in | Wt 192.0 lb

## 2023-09-06 DIAGNOSIS — F5089 Other specified eating disorder: Secondary | ICD-10-CM

## 2023-09-06 DIAGNOSIS — E669 Obesity, unspecified: Secondary | ICD-10-CM

## 2023-09-06 DIAGNOSIS — M25561 Pain in right knee: Secondary | ICD-10-CM

## 2023-09-06 DIAGNOSIS — J069 Acute upper respiratory infection, unspecified: Secondary | ICD-10-CM

## 2023-09-06 DIAGNOSIS — R7303 Prediabetes: Secondary | ICD-10-CM | POA: Diagnosis not present

## 2023-09-06 DIAGNOSIS — F3289 Other specified depressive episodes: Secondary | ICD-10-CM

## 2023-09-06 DIAGNOSIS — Z6834 Body mass index (BMI) 34.0-34.9, adult: Secondary | ICD-10-CM

## 2023-09-06 NOTE — Progress Notes (Signed)
 SUBJECTIVE:  Chief Complaint: Obesity  Interim History: She is down 3 lbs since her last visit.   Heather Hayes is a 63 year old female who presents for follow-up of her obesity treatment plan.  She has lost three pounds since her last visit, attributed to adherence to her dietary plan. Her activity level is limited due to a torn meniscus in her knee. She is mindful of her diet, focusing on increasing protein intake and reducing sugar and carbohydrates. She avoids chocolate candies and candy bowls at work. She is a stress eater but is trying to stay on her dietary plan. She works and sometimes teaches classes on Mondays. She has a supportive family, including a husband who is also working on his food intake.  Her activity level has been limited due to a torn meniscus in her knee, for which she recently received a steroid injection. She feels 'steroidy' since the injection. She avoids stairs and treadmill use due to her knee injury. She inquired about walking outside and was told it is permissible. She has not yet tried chair-based exercises or yoga. She has not purchased a stationary bicycle or joined an aquatic exercise program due to time constraints.  In January, she experienced an upper respiratory and sinus infection, for which she was treated with a Z-Pak and prednisone. She had significant coughing during this time. The infection has resolved, but the steroid treatment may have contributed to her feeling of being 'steroidy'.  She has been taking vitamin B12, which dissolves under the tongue, as recommended. She ordered it online from [employer]. She is actively engaged in maintaining her health, including scheduling routine screenings. She has recently scheduled a mammogram for April and is considering local yoga classes to improve her physical health. Heather Hayes is here to discuss her progress with her obesity treatment plan. She is on the Category 2 Plan and states she is following her eating  plan approximately 80-85 % of the time. She states she is not exercising 0 minutes 0 times per week due to a torn meniscus.    OBJECTIVE: Visit Diagnoses: Problem List Items Addressed This Visit     Generalized obesity   Depression   Prediabetes - Primary   Acute pain of right knee/torn meniscus   Other Visit Diagnoses       Upper respiratory tract infection, unspecified type         BMI 34.0-34.9,adult Current BMI 34.0         Obesity Heather Hayes, a 63 year old female, has lost 3 pounds since her last visit. Her activity level is limited due to a torn meniscus, leading to a slight decrease in muscle mass. She adheres to a high-protein, low-carb diet, avoiding sugars and carbs. Stress affects her eating habits, but she is making healthier choices. - Continue high-protein, low-carb diet - Avoid chocolate candies and candy bowls at work - Recommend chair yoga or other low-impact exercises   Prediabetes Last A1c was 5.8- slightly up/ insulin  slightly up- not at goals .  Medication(s): None Polyphagia:No-   Lab Results  Component Value Date   HGBA1C 5.8 (H) 07/04/2023   HGBA1C 5.7 10/20/2022   HGBA1C 5.5 11/09/2021   HGBA1C 5.5 05/26/2021   HGBA1C 5.6 09/29/2020   Lab Results  Component Value Date   INSULIN  10.6 07/04/2023   INSULIN  6.4 11/09/2021   INSULIN  6.7 05/26/2021   INSULIN  6.5 09/29/2020   INSULIN  8.0 05/20/2020    Plan: Focusing on increasing protein and avoiding  sugar and simple carbohydrates over the past month with good success.   Continue working on nutrition plan to decrease simple carbohydrates, increase lean proteins and exercise to promote weight loss, improve glycemic control and prevent progression to Type 2 diabetes.  Will plan to recheck labs in 2-3 months.   Right knee pain/Torn Meniscus Her torn meniscus limits physical activity. A recent steroid injection provided some relief, but she experienced systemic effects. Discussed risks of  high-impact activities and benefits of low-impact exercises, including walking, stationary biking, and chair-based exercises. Considered aquatic exercises if feasible. - Avoid high-impact activities - Consider low-impact exercises like walking, stationary biking, or chair-based exercises - Discuss potential benefits of aquatic exercises  Upper Respiratory and Sinus Infection Treated with a Z-Pak and prednisone in January for an upper respiratory and sinus infection, which caused significant coughing and discomfort. Discussed potential for recurrence and importance of monitoring symptoms. - Monitor for recurrence of symptoms - Encourage hydration and rest   Mood disorder/emotional eating Heather Hayes has had issues with stress/emotional eating. Currently this is moderately controlled. Overall mood is stable. Medication(s): Other: Lexapro  10 mg daily. No Side effects.   Plan: Continue Other: Lexapro  10 mg daily.   General Health Maintenance Proactive about health maintenance. Scheduled a mammogram for April and takes vitamin B12 as recommended. - Continue vitamin B12 - Attend scheduled mammogram in April  Follow-up - Schedule follow-up appointment for March 6th at 8:30 AM.  Vitals Temp: 97.8 F (36.6 C) BP: 129/72 Pulse Rate: 60 SpO2: 99 %   Anthropometric Measurements Height: 5' 3 (1.6 m) Weight: 192 lb (87.1 kg) BMI (Calculated): 34.02 Weight at Last Visit: 195 lb Weight Lost Since Last Visit: 3 lb Weight Gained Since Last Visit: 0 Starting Weight: 191 lb Total Weight Loss (lbs): 3 lb (1.361 kg) Peak Weight: 196 lb   Body Composition  Body Fat %: 45.1 % Fat Mass (lbs): 86.6 lbs Muscle Mass (lbs): 100 lbs Total Body Water (lbs): 71.4 lbs Visceral Fat Rating : 13   Other Clinical Data Fasting: No Labs: No Today's Visit #: 54 Starting Date: 05/15/19     ASSESSMENT AND PLAN:  Diet: Heather Hayes is currently in the action stage of change. As such, her goal is to  continue with weight loss efforts. She has agreed to Category 2 Plan.  Exercise: Heather Hayes has been instructed  try a chair yoga program while limited with knee pain  for weight loss and overall health benefits.   Behavior Modification:  We discussed the following Behavioral Modification Strategies today: increasing lean protein intake, decreasing simple carbohydrates, increasing vegetables, increase H2O intake, increase high fiber foods, emotional eating strategies , avoiding temptations, and planning for success. We discussed various medication options to help Heather Hayes with her weight loss efforts and we both agreed to continue to work on nutritional and behavioral strategies to promote weight loss.  .  Return in about 4 weeks (around 10/04/2023).Heather Hayes She was informed of the importance of frequent follow up visits to maximize her success with intensive lifestyle modifications for her multiple health conditions.  Attestation Statements:   Reviewed by clinician on day of visit: allergies, medications, problem list, medical history, surgical history, family history, social history, and previous encounter notes.   Time spent on visit including pre-visit chart review and post-visit care and charting was 32 minutes.    Cadell Gabrielson, PA-C

## 2023-09-19 DIAGNOSIS — G4733 Obstructive sleep apnea (adult) (pediatric): Secondary | ICD-10-CM | POA: Diagnosis not present

## 2023-09-30 ENCOUNTER — Telehealth: Payer: BC Managed Care – PPO | Admitting: Primary Care

## 2023-09-30 DIAGNOSIS — G4733 Obstructive sleep apnea (adult) (pediatric): Secondary | ICD-10-CM | POA: Diagnosis not present

## 2023-09-30 MED ORDER — FAMOTIDINE 40 MG PO TABS: 40.0000 mg | ORAL_TABLET | Freq: Every day | ORAL | Status: DC

## 2023-09-30 NOTE — Progress Notes (Signed)
 Virtual Visit via Video Note  I connected with Heather Hayes on 09/30/23 at  2:00 PM EST by a video enabled telemedicine application and verified that I am speaking with the correct person using two identifiers.  Location: Patient: Home Provider: Office    I discussed the limitations of evaluation and management by telemedicine and the availability of in person appointments. The patient expressed understanding and agreed to proceed.  History of Present Illness: 63 year old female, never smoked.  Past medical history significant of OSA, sleep-related breathing disorder, history of breast cancer, insulin resistance, obesity.  Previous [facility] pulmonary encounter:  Her family has been worried about her snoring for years.  She stops breathing while asleep.  Her sister and daughter both use CPAP.  She is convinced her father had sleep apnea also.  Her son is a Soil scientist and advised her to have further assessment.  She falls asleep after dinner or while waiting for her husband to return from work.  She goes to sleep between 930 and 1030 pm.  She falls asleep in few minutes.  She wakes up some times to use the bathroom.  She gets out of bed between 515 and 525 am.  She feels tired in the morning.  She sometimes gets morning headaches.  She does not use anything to help her fall sleep.  She drinks coffee in the morning and later in the day.  She denies sleep walking, sleep talking, bruxism, or nightmares.  There is no history of restless legs.  She denies sleep hallucinations, sleep paralysis, or cataplexy.  The Epworth score is 6 out of 24.  06/25/2022 Patient presents today to review sleep study results. She was seen for sleep consult on 04/13/22 by Dr. Craige Cotta. She has symptoms or snoring and witnessed apnea. She has a family history of sleep apnea. Patient had a home sleep study on 06/14/2022 that showed severe obstructive sleep apnea, AHI 53.2/hour with SpO2 low 70%.  We reviewed treatment options  including weight loss, oral appliance, CPAP therapy or referral to ENT for possible surgical options.  She is open to trying CPAP.  Her main goal is to feel more rested and have more energy.  08/23/2022 - Interim hx  Patient presents today for CPAP compliance. Overall she is doing great. She is 100% compliant with CPAP use last 30 days. No issues with mask fir or pressure settings. More energized in the morning and evening when she gets home from work. Not dragging when she wakes up in the morning. She will still occasionally wake up at night d/t tubing. She is using CPAP nasal mask, wearing nasal pad underneath to prevent skin breakdown. DME is [employer], getting supplies sent regularly/changing mask monthly.  Airview download 07/21/22- 08/19/21 Usage 30/30 days; 100% > 4 hours Average usage 7 hours 6 mins Pressure 5-15cm h20 (12.8cm h20-95%) Airleaks 7L/min (95%) AHI 3.2   09/30/2023- Interim hx  Discussed the use of AI scribe software for clinical note transcription with the patient, who gave verbal consent to proceed.  History of Present Illness   Heather Hayes is a 63 year old female with severe obstructive sleep apnea who presents for a one-year follow-up.  Initially seen in September 2023 for a sleep consult due to symptoms of snoring and witnessed apnea, a home sleep study in November 2023 confirmed severe obstructive sleep apnea with an average of 53 apneic events per hour and a lowest oxygen saturation of 70%.  She has been compliant with CPAP  therapy, feeling more energized in the mornings and evenings. She uses a nasal mask with CPAP settings auto set between 5-15cm h20. Her apnea score is currently 1.4/hour, and she experiences minimal air leaks.  She experiences nocturnal symptoms of excessive salivation, queasiness, nausea, and some pain under the breastbone area, occurring 8 to 10 times since the beginning of the year. No diarrhea or vomiting is present, and the nausea subsides  quickly. There is a family history of gallbladder issues, including her mother, sister, and niece.  Recently, she had an upper respiratory infection with a sinus infection, which prevented her from using the CPAP for a few nights due to coughing. Despite this, she feels the CPAP is working well overall.      Airview download 08/29/23-09/27/23 Usage days 30/30 days; 27 days (90%) Pressure 5-15cm h20 (12.2cm h20-95%) Airleaks 2.5L/min (95%) AHI 1.4    Observations/Objective:  Appears well without overt respiratory symptoms  Assessment and Plan:  1. OSA (obstructive sleep apnea) (Primary)     Obstructive Sleep Apnea Patient is 90% compliant with CPAP therapy >4 hours over the last 30 days. Current pressure 5-15cm h20 with residual apnea score of 1.4/hour. Reports occasional waking due to nausea, not felt to be related to OSA. No changes in mask or machine settings needed. -Continue CPAP nightly for 4-6 hours. -Renew supplies through [employer] for the next year. -Eligible for new CPAP machine in November/December 2028.  Gastrointestinal Symptoms Reports nocturnal nausea, salivation, and occasional epigastric pain. No vomiting or diarrhea. Possible reflux or gallbladder issue. -Start Famotidine (Pepcid) 40mg  at bedtime. -Consider use of a wedge pillow.  -Follow up with nutritionist or gastroenterologist if symptoms persist.  Follow-up in one year.      Follow Up Instructions:  1 year    I discussed the assessment and treatment plan with the patient. The patient was provided an opportunity to ask questions and all were answered. The patient agreed with the plan and demonstrated an understanding of the instructions.   The patient was advised to call back or seek an in-person evaluation if the symptoms worsen or if the condition fails to improve as anticipated.  I provided 22 minutes of non-face-to-face time during this encounter.   Glenford Bayley, NP

## 2023-09-30 NOTE — Patient Instructions (Signed)
 -  OBSTRUCTIVE SLEEP APNEA: Obstructive sleep apnea is a condition where your airway becomes blocked during sleep, causing breathing pauses. You have been using your CPAP machine effectively, which has improved your symptoms. Continue using your CPAP machine nightly for 4-6 hours. We will renew your supplies through [employer] for the next year, and you will be eligible for a new CPAP machine in November/December 2028.  -GASTROINTESTINAL SYMPTOMS: Your symptoms of nighttime nausea, salivation, and occasional pain under the breastbone may be due to acid reflux or a gallbladder issue. We recommend starting Famotidine (Pepcid) 40mg  at bedtime and considering the use of a wedge pillow to elevate your head while sleeping. If your symptoms persist, please follow up with a nutritionist or gastroenterologist.  INSTRUCTIONS:  Please continue using your CPAP machine nightly for 4-6 hours and start taking Famotidine (Pepcid) 40mg  at bedtime. Consider using a wedge pillow to help with your gastrointestinal symptoms. If your symptoms persist, follow up with a nutritionist or gastroenterologist. We will see you again in one year for a follow-up appointment.

## 2023-10-06 ENCOUNTER — Telehealth (INDEPENDENT_AMBULATORY_CARE_PROVIDER_SITE_OTHER): Payer: BC Managed Care – PPO | Admitting: Physician Assistant

## 2023-10-06 ENCOUNTER — Encounter (INDEPENDENT_AMBULATORY_CARE_PROVIDER_SITE_OTHER): Payer: Self-pay | Admitting: Physician Assistant

## 2023-10-06 VITALS — Ht 63.0 in

## 2023-10-06 DIAGNOSIS — E785 Hyperlipidemia, unspecified: Secondary | ICD-10-CM | POA: Diagnosis not present

## 2023-10-06 DIAGNOSIS — E7849 Other hyperlipidemia: Secondary | ICD-10-CM

## 2023-10-06 DIAGNOSIS — R110 Nausea: Secondary | ICD-10-CM

## 2023-10-06 DIAGNOSIS — F3289 Other specified depressive episodes: Secondary | ICD-10-CM

## 2023-10-06 DIAGNOSIS — R7303 Prediabetes: Secondary | ICD-10-CM | POA: Diagnosis not present

## 2023-10-06 DIAGNOSIS — Z6833 Body mass index (BMI) 33.0-33.9, adult: Secondary | ICD-10-CM

## 2023-10-06 DIAGNOSIS — S83209A Unspecified tear of unspecified meniscus, current injury, unspecified knee, initial encounter: Secondary | ICD-10-CM | POA: Diagnosis not present

## 2023-10-06 DIAGNOSIS — J069 Acute upper respiratory infection, unspecified: Secondary | ICD-10-CM

## 2023-10-06 DIAGNOSIS — E559 Vitamin D deficiency, unspecified: Secondary | ICD-10-CM

## 2023-10-06 DIAGNOSIS — E669 Obesity, unspecified: Secondary | ICD-10-CM

## 2023-10-06 NOTE — Progress Notes (Signed)
 TeleHealth Visit:  This visit was completed with telemedicine (audio/video) technology. Heather Hayes has verbally consented to this TeleHealth visit. The patient is located at home, the provider is located at the [facility] office. The participants in this visit include the listed provider and patient. The visit was conducted today via MyChart video.  OBESITY Heather Hayes is here to discuss her progress with her obesity treatment plan along with follow-up of her obesity related diagnoses.   Today's visit was # 55 Starting weight: 191 lbs Starting date: 05/15/2019 Weight at last in office visit: 192 lbs on 09/06/23 Total weight loss: 0 lbs at last in office visit on 09/06/23. Today's reported weight (195 lb):  195 lbs  Nutrition Plan: the Category 2 plan - 50% adherence.  Current exercise: none has been sick with URI and also possible GI bug.    Dealing with torn meniscus in knee as well but trying to walk some- Did 6K steps yesterday.   Interim History:   She has a history of a torn meniscus in her knee, which has been symptomatic recently. She has received two steroid injections, with the last one administered in January. Despite this, she notes increased symptoms, which she attributes in part to her weight. She is actively trying to manage her sugar intake, acknowledging its role in inflammation.  She experienced significant nausea on Tuesday, suspecting it might be related to a norovirus outbreak at her workplace. She took her last Zofran, which may have helped alleviate the symptoms. She felt better the following day. Her daughter-in-law and granddaughter have also been affected by the virus, and there have been multiple cases in her office.  She describes episodes occurring about a dozen times this year where she feels like she is about to vomit, with increased salivation lasting one to two minutes before subsiding. These episodes typically occur while she is resting. No arm or jaw pain, but she  mentions "manageable pain under her breastbone." No overt chest pain, palpitations, or regular chest pressure, but she feels tired despite using her CPAP every night and achieving good sleep scores. She denies any other associated symptoms and is not currently having these symptoms.  She was advised to call her PCP today for further evaluation and we discussed that cardiac symptoms may mimic GI symptoms and that she should not ignore the symptoms and seek immediate evaluation for recurrence in the ER.    She acknowledges her high cholesterol, which she attributes to being postmenopausal and taking Anastrozole. Her job is mentally stressful, and she often skips proper breaks and meals, eating at her desk instead. Not eating all of the food on the plan., Protein intake is less than prescribed., Is exceeding snack calorie allotment, Not meeting protein goals., Water intake is inadequate., and Reports excessive cravings.  Upcoming: Anniversary in April and B Day in April  Pharmacotherapy: Heather Hayes is on None  Hunger is poorly controlled.  Cravings are poorly controlled.  Assessment/Plan:  Obesity Increased sugar cravings and decreased adherence to plan related to stress and recent illness as well as ongoing issues with a torn meniscus of her knee.  We discussed concentrating on getting adequate protein and increasing water intake to improve hunger/satiety and cravings. She does report better results when she consistently meets her protein goal.  We discussed strategies to avoid the office candy jar and other temptations.  She will follow up in the office in 4 weeks.   Intermittent nausea Intermittent episodes of nausea with salivation at rest. No associated  arm pain, jaw pain, or palpitations. "Mild pain under the breastbone." Symptoms may be stress-related or dietary. She is asymptomatic currently.  Cardiac evaluation recommended due to potential atypical presentation in women. - Discuss symptoms with  primary care physician, Dr. Tiburcio Pea - Consider earlier evaluation if symptoms persist or worsen - Seek immediate medical attention if symptoms recur  - Consideration for coronary calcium score to evaluate for cardiac issues  Torn meniscus Chronic torn meniscus in the knee with recent exacerbation. Previous treatment includes two steroid injections, the last one in January. Symptoms may be exacerbated by weight and dietary habits, particularly high sugar intake which can cause inflammation. - Monitor symptoms and consider further orthopedic evaluation if symptoms persist or worsen  Hyperlipidemia Elevated cholesterol levels, highest recorded. Contributing factors include postmenopausal status and use of Anastrozole. Dietary habits, including high sugar and carbohydrate intake, may contribute to condition. Emphasis on dietary modifications to manage cholesterol levels. - Focus on dietary modifications, emphasizing protein intake and reducing sugar and carbohydrate consumption - Stay hydrated and monitor cholesterol levels  Obstructive sleep apnea Obstructive sleep apnea managed with CPAP therapy. Reports good adherence to CPAP with adequate sleep duration and quality. Persistent fatigue noted, possibly related to stress and dietary habits. - Continue CPAP therapy - Monitor fatigue levels and consider lifestyle modifications to reduce stress  Generalized Obesity: Current BMI 33.8 Starting BMI  Pharmacotherapy Plan   Continue working on nutrition plan to decrease simple carbohydrates, increase lean proteins and exercise to promote weight loss, improve glycemic control and prevent progression to Type 2 diabetes.    Heather Hayes is currently in the action stage of change. As such, her goal is to continue with weight loss efforts.  She has agreed to the Category 2 plan.  Exercise goals: All adults should avoid inactivity. Some physical activity is better than none, and adults who participate in any  amount of physical activity gain some health benefits. Adults should also include muscle-strengthening activities that involve all major muscle groups on 2 or more days a week.  Behavioral modification strategies: increasing lean protein intake, decreasing simple carbohydrates , no meal skipping, decrease eating out, better snacking choices, planning for success, increasing vegetables, increasing lower sugar fruits, increasing fiber rich foods, decrease junk food, get rid of junk food in the home, decrease snacking , avoiding temptations, and mindful eating.  Heather Hayes has agreed to follow-up with our clinic in 4 weeks.  No orders of the defined types were placed in this encounter.   There are no discontinued medications.   No orders of the defined types were placed in this encounter.     Objective:   VITALS: Per patient if applicable, see vitals. GENERAL: Alert and in no acute distress. CARDIOPULMONARY: No increased WOB. Speaking in clear sentences.  PSYCH: Pleasant and cooperative. Speech normal rate and rhythm. Affect is appropriate. Insight and judgement are appropriate. Attention is focused, linear, and appropriate.  NEURO: Oriented as arrived to appointment on time with no prompting.   Attestation Statements:   Reviewed by clinician on day of visit: allergies, medications, problem list, medical history, surgical history, family history, social history, and previous encounter notes.   Time spent on visit including the items listed below was 35 minutes.  -preparing to see the patient (e.g., review of tests, history, previous notes) -obtaining and/or reviewing separately obtained history -counseling and educating the patient/family/caregiver -documenting clinical information in the electronic or other health record -ordering medications, tests, or procedures -independently interpreting results and communicating results  to the patient/ family/caregiver -referring and communicating with  other health care professionals  -care coordination   Heather Westergren,PA-C

## 2023-10-17 DIAGNOSIS — G4733 Obstructive sleep apnea (adult) (pediatric): Secondary | ICD-10-CM | POA: Diagnosis not present

## 2023-10-27 ENCOUNTER — Inpatient Hospital Stay: Payer: BC Managed Care – PPO | Attending: Hematology

## 2023-10-27 ENCOUNTER — Inpatient Hospital Stay: Payer: BC Managed Care – PPO | Admitting: Hematology

## 2023-10-27 ENCOUNTER — Encounter: Payer: Self-pay | Admitting: Hematology

## 2023-10-27 VITALS — BP 136/85 | HR 61 | Temp 98.1°F | Resp 17 | Wt 203.2 lb

## 2023-10-27 DIAGNOSIS — C50411 Malignant neoplasm of upper-outer quadrant of right female breast: Secondary | ICD-10-CM | POA: Diagnosis not present

## 2023-10-27 DIAGNOSIS — Z79811 Long term (current) use of aromatase inhibitors: Secondary | ICD-10-CM | POA: Diagnosis not present

## 2023-10-27 DIAGNOSIS — Z923 Personal history of irradiation: Secondary | ICD-10-CM | POA: Diagnosis not present

## 2023-10-27 DIAGNOSIS — Z1721 Progesterone receptor positive status: Secondary | ICD-10-CM | POA: Diagnosis not present

## 2023-10-27 DIAGNOSIS — Z170 Estrogen receptor positive status [ER+]: Secondary | ICD-10-CM

## 2023-10-27 DIAGNOSIS — M8580 Other specified disorders of bone density and structure, unspecified site: Secondary | ICD-10-CM | POA: Diagnosis not present

## 2023-10-27 DIAGNOSIS — Z1732 Human epidermal growth factor receptor 2 negative status: Secondary | ICD-10-CM | POA: Insufficient documentation

## 2023-10-27 DIAGNOSIS — Z79899 Other long term (current) drug therapy: Secondary | ICD-10-CM | POA: Diagnosis not present

## 2023-10-27 LAB — CMP (CANCER CENTER ONLY)
ALT: 13 U/L (ref 0–44)
AST: 15 U/L (ref 15–41)
Albumin: 4.1 g/dL (ref 3.5–5.0)
Alkaline Phosphatase: 86 U/L (ref 38–126)
Anion gap: 4 — ABNORMAL LOW (ref 5–15)
BUN: 22 mg/dL (ref 8–23)
CO2: 31 mmol/L (ref 22–32)
Calcium: 9.8 mg/dL (ref 8.9–10.3)
Chloride: 104 mmol/L (ref 98–111)
Creatinine: 0.76 mg/dL (ref 0.44–1.00)
GFR, Estimated: >60 mL/min (ref >60)
Glucose, Bld: 96 mg/dL (ref 70–99)
Potassium: 4.5 mmol/L (ref 3.5–5.1)
Sodium: 139 mmol/L (ref 135–145)
Total Bilirubin: 0.5 mg/dL (ref 0.0–1.2)
Total Protein: 7.3 g/dL (ref 6.5–8.1)

## 2023-10-27 LAB — CBC WITH DIFFERENTIAL (CANCER CENTER ONLY)
Abs Immature Granulocytes: 0.03 10*3/uL (ref 0.00–0.07)
Basophils Absolute: 0.1 10*3/uL (ref 0.0–0.1)
Basophils Relative: 1 %
Eosinophils Absolute: 0.3 10*3/uL (ref 0.0–0.5)
Eosinophils Relative: 5 %
HCT: 39.3 % (ref 36.0–46.0)
Hemoglobin: 12.6 g/dL (ref 12.0–15.0)
Immature Granulocytes: 1 %
Lymphocytes Relative: 27 %
Lymphs Abs: 1.6 10*3/uL (ref 0.7–4.0)
MCH: 30.2 pg (ref 26.0–34.0)
MCHC: 32.1 g/dL (ref 30.0–36.0)
MCV: 94.2 fL (ref 80.0–100.0)
Monocytes Absolute: 0.5 10*3/uL (ref 0.1–1.0)
Monocytes Relative: 8 %
Neutro Abs: 3.4 10*3/uL (ref 1.7–7.7)
Neutrophils Relative %: 58 %
Platelet Count: 241 10*3/uL (ref 150–400)
RBC: 4.17 MIL/uL (ref 3.87–5.11)
RDW: 13.1 % (ref 11.5–15.5)
WBC Count: 5.8 10*3/uL (ref 4.0–10.5)
nRBC: 0 % (ref 0.0–0.2)

## 2023-10-27 NOTE — Progress Notes (Signed)
 [facility]   [phone] Fax:([phone]   Clinic Follow up Note   Patient Care Team: Noberto Retort, MD as PCP - General (Family Medicine) Pershing Proud, RN as Oncology Nurse Navigator Donnelly Angelica, RN as Oncology Nurse Navigator Glenna Fellows, MD (Inactive) as Consulting Physician (General Surgery) Malachy Mood, MD as Consulting Physician (Hematology) Antony Blackbird, MD as Consulting Physician (Radiation Oncology) Pollyann Samples, NP as Nurse Practitioner (Nurse Practitioner)  Date of Service:  10/27/2023  CHIEF COMPLAINT: f/u of breast cancer  CURRENT THERAPY:  Adjuvant anastrozole  Oncology History   Malignant neoplasm of upper-outer quadrant of right breast in female, estrogen receptor positive (HCC) Stage IA, p (T1b,N0,M0), ER/PR+, HER2-, Grade II -Diagnosed in 10/2018, s/p right lumpectomy and radiation.  Given low-grade strongly ER positive disease, Oncotype was not recommended  -She tried anastrozole first from 03/22/2019 - 05/21/2021, switched to exemestane due to worsening joint pain and low libido -Joint pain improved initially on exemestane but has worsened lately, along with fatigue, weight gain, and anxiety (related to other factors as well and present prior to childbearing) -Due to the negative impact on her quality of life, Exemestane was stopped 04/2022 and she began tamoxifen in 06/2022  -Due to worsening hot flashes, weight gain and metabolic changes, she went back to Anastrozole 10/2022.  Tolerating well, plan to complete 5 year therapy in 03/2024   Assessment and Plan    Breast cancer She is on anastrozole, nearing the end of a five-year course, with completion expected in August. No new breast-related concerns or discomfort at the surgical site. Mammogram scheduled for April. Considering annual follow-up from year five to ten if not followed by a gynecologist or primary care physician. - Continue anastrozole until the end  of August - Ensure sufficient refills of anastrozole - Perform mammogram in April - Consider annual follow-up from year five to ten if not followed by gynecologist or primary care physician  Arthralgia Worsening joint pain, particularly in the lower back, hips, and knees, possibly exacerbated by anastrozole. Known partial torn meniscus and knee arthritis. Exercise, particularly stationary biking, is recommended to improve joint pain and energy levels. - Consider stationary bike exercises to improve joint pain and energy levels - Coordinate with orthopedic doctor for lubricant gel injection approval for knee  Osteopenia Osteopenia present but not at high risk for fracture. Bone density was last assessed last year and is due next year. Continues calcium and vitamin D supplementation. - Continue calcium and vitamin D supplementation  Intermittent nausea Episodes of nausea with mouth watering sensation, occurring about a dozen times this year. Symptoms are non-specific and could be related to acid reflux, gallbladder issues, or cardiac concerns. No associated chest heaviness, but some anxiety-related chest discomfort reported. Screening tests for heart disease, such as a calcium score CT scan, are suggested to rule out cardiac issues. Acid reflux treatment options include Pepcid, Tums, or over-the-counter omeprazole. - Discuss with primary care physician during upcoming physical - Consider screening tests for heart disease, such as a calcium score CT scan - Try Pepcid, Tums, or over-the-counter omeprazole for potential acid reflux  Fatigue Significant fatigue, particularly in the evenings, despite using CPAP and having good sleep quality. Fatigue may be related to joint pain or other underlying conditions. Regular exercise is encouraged to potentially improve energy levels. - Encourage regular exercise to potentially improve energy levels  Plan -She is clinically doing well, no concern for  breast cancer recurrence -She will  continue anastrozole until the end of August 2025 -Follow-up as needed.  She will follow-up with her PCP and gynecologist for breast cancer surveillance     SUMMARY OF ONCOLOGIC HISTORY: Oncology History Overview Note  Cancer Staging Malignant neoplasm of upper-outer quadrant of right breast in female, estrogen receptor positive (HCC) Staging form: Breast, AJCC 8th Edition - Clinical stage from 10/02/2018: Stage IA (cT1b, cN0, cM0, G2, ER+, PR+, HER2-) - Signed by Malachy Mood, MD on 10/10/2018 - Pathologic stage from 10/27/2018: Stage IA (pT1b, pN0, cM0, G1, ER+, PR+, HER2-) - Signed by Malachy Mood, MD on 02/26/2019     Malignant neoplasm of upper-outer quadrant of right breast in female, estrogen receptor positive (HCC)  09/27/2018 Mammogram   [facility] Mammogram 09/27/18  IMPRESSION: 1. Suspicious right breast mass measures 7 x 7 x 6 mm with associated vascularity at the 12 o'clock position 7 cm from the nipple. 2. No suspicious right axillary lymphadenopathy.   10/02/2018 Cancer Staging   Staging form: Breast, AJCC 8th Edition - Clinical stage from 10/02/2018: Stage IA (cT1b, cN0, cM0, G2, ER+, PR+, HER2-) - Signed by Malachy Mood, MD on 10/10/2018   10/02/2018 Initial Biopsy   Diagnosis 10/02/18 Breast, right, needle core biopsy, 12 o'clock - INVASIVE DUCTAL CARCINOMA.   10/02/2018 Receptors her2   Results: IMMUNOHISTOCHEMICAL AND MORPHOMETRIC ANALYSIS PERFORMED MANUALLY The tumor cells are NEGATIVE for Her2 (1+). Estrogen Receptor: 95%, POSITIVE, STRONG STAINING INTENSITY Progesterone Receptor: 95%, POSITIVE, STRONG STAINING INTENSITY Proliferation Marker Ki67: 5%   10/04/2018 Initial Diagnosis   Malignant neoplasm of upper-outer quadrant of right breast in female, estrogen receptor positive (HCC)   10/27/2018 Surgery   RIGHT BREAST LUMPECTOMY WITH RADIOACTIVE SEED AND SENTINEL LYMPH NODE BIOPSY by Dr Johna Sheriff   10/27/2018 Pathology Results   Diagnosis  10/27/18 1. Breast, lumpectomy, Right - INVASIVE DUCTAL CARCINOMA WITH CALCIFICATIONS, GRADE I/III, SPANNING 0.7 CM. - THE SURGICAL RESECTION MARGIN ARE NEGATIVE FOR CARCINOMA. - SEE ONCOLOGY TABLE BELOW. 2. Lymph node, sentinel, biopsy, Right - THERE IS NO EVIDENCE OF CARCINOMA IN 1 OF 1 LYMPH NODE (0/1). 3. Lymph node, sentinel, biopsy, Right - THERE IS NO EVIDENCE OF CARCINOMA IN 1 OF 1 LYMPH NODE (0/1).    - 01/29/2019 Radiation Therapy   Radiation therapy at [facility] on 01/29/19.    10/27/2018 Cancer Staging   Staging form: Breast, AJCC 8th Edition - Pathologic stage from 10/27/2018: Stage IA (pT1b, pN0, cM0, G1, ER+, PR+, HER2-) - Signed by Malachy Mood, MD on 02/26/2019   03/2019 -  Anti-estrogen oral therapy   Anastrozole 1mg  daily starting in 03/2019    07/09/2019 Survivorship   SCP delivered by Santiago Glad, NP       Discussed the use of AI scribe software for clinical note transcription with the patient, who gave verbal consent to proceed.  History of Present Illness   The patient, a 63 year old female with a history of breast cancer, presents for a routine follow-up. She reports no new issues since her last visit six months ago. She has been taking Anastrozole for almost five years, with the only side effect being fatigue, particularly in the evenings. Despite this, she reports good sleep quality due to the use of a CPAP machine.  The patient also reports joint pain, particularly in the lower back, hips, and knees, which she attributes to arthritis and a partially torn meniscus in one knee. She notes that the pain has worsened over time, particularly in the mornings.  Additionally, the patient has  experienced episodes of pre-nausea symptoms, such as mouth watering, approximately a dozen times over the past year. These episodes last for one to two minutes and then subside. They occur both when the patient is and is not using her CPAP machine. The patient has also experienced  some chest heaviness and back pain, which she attributes to anxiety.  The patient also reports weight gain, which she attributes to a torn meniscus in her knee limiting her ability to exercise. She is currently seeking approval for an injection of lubricant gel into her knee to alleviate the pain.         All other systems were reviewed with the patient and are negative.  MEDICAL HISTORY:  Past Medical History:  Diagnosis Date   Anxiety    Arthritis    Back pain    Breast cancer (HCC)    Bursitis    Cancer (HCC)    right breast cancer 09-2018   Constipation    GERD (gastroesophageal reflux disease)    some heart burn from Arimidex   History of radiation therapy    completed 01-29-2019   Hyperlipidemia    Joint pain    Personal history of radiation therapy    Rheumatic fever    Sleep apnea    Swallowing difficulty     SURGICAL HISTORY: Past Surgical History:  Procedure Laterality Date   BREAST LUMPECTOMY Right 10/27/2018   BREAST LUMPECTOMY WITH RADIOACTIVE SEED AND SENTINEL LYMPH NODE BIOPSY Right 10/27/2018   Procedure: RIGHT BREAST LUMPECTOMY WITH RADIOACTIVE SEED AND SENTINEL LYMPH NODE BIOPSY;  Surgeon: Glenna Fellows, MD;  Location: [facility];  Service: General;  Laterality: Right;   CERVICAL SPINE SURGERY  2009   COLONOSCOPY     ENDOMETRIAL ABLATION     INCISION AND DRAINAGE PERIRECTAL ABSCESS N/A 05/22/2023   Procedure: IRRIGATION AND DEBRIDEMENT PERIRECTAL ABSCESS AND DEBRIDEMENT OF INVOLVED TISSUES;  Surgeon: Moise Boring, MD;  Location: [facility] OR;  Service: General;  Laterality: N/A;   POLYPECTOMY     TUBAL LIGATION     tube ligation       I have reviewed the social history and family history with the patient and they are unchanged from previous note.  ALLERGIES:  is allergic to compazine [prochlorperazine edisylate].  MEDICATIONS:  Current Outpatient Medications  Medication Sig Dispense Refill   anastrozole (ARIMIDEX) 1 MG tablet  TAKE 1 TABLET BY MOUTH EVERY DAY 90 tablet 1   cholecalciferol (VITAMIN D3) 25 MCG (1000 UNIT) tablet Take 1,000 Units by mouth daily.     clonazePAM (KLONOPIN) 0.5 MG tablet Take 0.5 mg by mouth 2 (two) times daily as needed for anxiety.     escitalopram (LEXAPRO) 10 MG tablet Take 1 tablet (10 mg total) by mouth daily. 90 tablet 0   famotidine (PEPCID) 40 MG tablet Take 1 tablet (40 mg total) by mouth at bedtime.     meloxicam (MOBIC) 15 MG tablet Take 15 mg by mouth daily.     No current facility-administered medications for this visit.    PHYSICAL EXAMINATION: ECOG PERFORMANCE STATUS: 0 - Asymptomatic  Vitals:   10/27/23 0914  BP: 136/85  Pulse: 61  Resp: 17  Temp: 98.1 F (36.7 C)  SpO2: 100%   Wt Readings from Last 3 Encounters:  10/27/23 203 lb 3.2 oz (92.2 kg)  09/06/23 192 lb (87.1 kg)  08/09/23 195 lb (88.5 kg)     GENERAL:alert, no distress and comfortable SKIN: skin color, texture, turgor are  normal, no rashes or significant lesions EYES: normal, Conjunctiva are pink and non-injected, sclera clear NECK: supple, thyroid normal size, non-tender, without nodularity LYMPH:  no palpable lymphadenopathy in the cervical, axillary  LUNGS: clear to auscultation and percussion with normal breathing effort HEART: regular rate & rhythm and no murmurs and no lower extremity edema ABDOMEN:abdomen soft, non-tender and normal bowel sounds Musculoskeletal:no cyanosis of digits and no clubbing  NEURO: alert & oriented x 3 with fluent speech, no focal motor/sensory deficits BREAST: a 1cm hard nodule at twelve to one o'clock position of right breast. No other palpable nodule or adenopathy      LABORATORY DATA:  I have reviewed the data as listed    Latest Ref Rng & Units 10/27/2023    8:52 AM 07/04/2023    8:29 AM 05/23/2023    9:18 AM  CBC  WBC 4.0 - 10.5 K/uL 5.8  6.0  14.3   Hemoglobin 12.0 - 15.0 g/dL 16.1  09.6  04.5   Hematocrit 36.0 - 46.0 % 39.3  39.3  34.2    Platelets 150 - 400 K/uL 241  322  315         Latest Ref Rng & Units 10/27/2023    8:52 AM 07/04/2023    8:29 AM 05/23/2023    9:18 AM  CMP  Glucose 70 - 99 mg/dL 96  98  409   BUN 8 - 23 mg/dL 22  16  15    Creatinine 0.44 - 1.00 mg/dL 8.11  9.14  7.82   Sodium 135 - 145 mmol/L 139  140  137   Potassium 3.5 - 5.1 mmol/L 4.5  4.7  4.1   Chloride 98 - 111 mmol/L 104  100  110   CO2 22 - 32 mmol/L 31  21  20    Calcium 8.9 - 10.3 mg/dL 9.8  9.5  8.1   Total Protein 6.5 - 8.1 g/dL 7.3  7.1  6.2   Total Bilirubin 0.0 - 1.2 mg/dL 0.5  0.4  0.6   Alkaline Phos 38 - 126 U/L 86  112  86   AST 15 - 41 U/L 15  19  21    ALT 0 - 44 U/L 13  18  29        RADIOGRAPHIC STUDIES: I have personally reviewed the radiological images as listed and agreed with the findings in the report. No results found.    No orders of the defined types were placed in this encounter.  All questions were answered. The patient knows to call the clinic with any problems, questions or concerns. No barriers to learning was detected. The total time spent in the appointment was 25 minutes.     Malachy Mood, MD 10/27/2023

## 2023-10-27 NOTE — Assessment & Plan Note (Signed)
 Stage IA, p (T1b,N0,M0), ER/PR+, HER2-, Grade II -Diagnosed in 10/2018, s/p right lumpectomy and radiation.  Given low-grade strongly ER positive disease, Oncotype was not recommended  -She tried anastrozole first from 03/22/2019 - 05/21/2021, switched to exemestane due to worsening joint pain and low libido -Joint pain improved initially on exemestane but has worsened lately, along with fatigue, weight gain, and anxiety (related to other factors as well and present prior to childbearing) -Due to the negative impact on her quality of life, Exemestane was stopped 04/2022 and she began tamoxifen in 06/2022  -Due to worsening hot flashes, weight gain and metabolic changes, she went back to Anastrozole 10/2022.  Tolerating well, plan to complete 5 year therapy in 03/2024

## 2023-11-02 ENCOUNTER — Other Ambulatory Visit (HOSPITAL_COMMUNITY): Payer: Self-pay | Admitting: Family Medicine

## 2023-11-02 DIAGNOSIS — Z136 Encounter for screening for cardiovascular disorders: Secondary | ICD-10-CM

## 2023-11-02 DIAGNOSIS — R7303 Prediabetes: Secondary | ICD-10-CM | POA: Diagnosis not present

## 2023-11-02 DIAGNOSIS — Z0000 Encounter for general adult medical examination without abnormal findings: Secondary | ICD-10-CM | POA: Diagnosis not present

## 2023-11-02 DIAGNOSIS — C50919 Malignant neoplasm of unspecified site of unspecified female breast: Secondary | ICD-10-CM | POA: Diagnosis not present

## 2023-11-02 DIAGNOSIS — E7800 Pure hypercholesterolemia, unspecified: Secondary | ICD-10-CM | POA: Diagnosis not present

## 2023-11-03 ENCOUNTER — Ambulatory Visit (INDEPENDENT_AMBULATORY_CARE_PROVIDER_SITE_OTHER): Admitting: Physician Assistant

## 2023-11-11 ENCOUNTER — Ambulatory Visit (HOSPITAL_COMMUNITY)
Admission: RE | Admit: 2023-11-11 | Discharge: 2023-11-11 | Disposition: A | Discharge status: Home / Self Care | Payer: Self-pay | Source: Ambulatory Visit | Attending: Family Medicine | Admitting: Family Medicine

## 2023-11-11 ENCOUNTER — Ambulatory Visit
Admission: RE | Admit: 2023-11-11 | Discharge: 2023-11-11 | Disposition: A | Discharge status: Home / Self Care | Payer: BC Managed Care – PPO | Source: Ambulatory Visit | Attending: Nurse Practitioner | Admitting: Nurse Practitioner

## 2023-11-11 DIAGNOSIS — Z136 Encounter for screening for cardiovascular disorders: Secondary | ICD-10-CM | POA: Insufficient documentation

## 2023-11-11 DIAGNOSIS — Z1231 Encounter for screening mammogram for malignant neoplasm of breast: Secondary | ICD-10-CM | POA: Diagnosis not present

## 2023-11-11 DIAGNOSIS — Z170 Estrogen receptor positive status [ER+]: Secondary | ICD-10-CM

## 2023-11-21 ENCOUNTER — Telehealth: Payer: Self-pay

## 2023-11-21 NOTE — Telephone Encounter (Addendum)
 Reached out to the patient via telephone call, per Lacie Burton,NP.  Went over providers comments from below. Patient voiced understanding and did not have any further concerns pr questions.  ----- Message from Kaitlyn E  Walisiewicz sent at 11/16/2023  4:00 PM EDT ----- Please call patient to let her know her mammogram showed no signs of cancer. She will be due for her next screening mammogram in 1 year.  Per Dr. Candise Chambers last note patient can follow up with PCP for next mammogram as she will have finished 5 years of anastrozole . If she prefers here she will need a mammogram ordered for 10/2024 and provider appt.

## 2023-12-02 DIAGNOSIS — G4733 Obstructive sleep apnea (adult) (pediatric): Secondary | ICD-10-CM | POA: Diagnosis not present

## 2023-12-20 DIAGNOSIS — R7303 Prediabetes: Secondary | ICD-10-CM | POA: Diagnosis not present

## 2023-12-20 DIAGNOSIS — E559 Vitamin D deficiency, unspecified: Secondary | ICD-10-CM | POA: Diagnosis not present

## 2023-12-20 DIAGNOSIS — G4733 Obstructive sleep apnea (adult) (pediatric): Secondary | ICD-10-CM | POA: Diagnosis not present

## 2023-12-20 DIAGNOSIS — Z1331 Encounter for screening for depression: Secondary | ICD-10-CM | POA: Diagnosis not present

## 2024-01-02 DIAGNOSIS — G4733 Obstructive sleep apnea (adult) (pediatric): Secondary | ICD-10-CM | POA: Diagnosis not present

## 2024-01-03 DIAGNOSIS — E7800 Pure hypercholesterolemia, unspecified: Secondary | ICD-10-CM | POA: Diagnosis not present

## 2024-01-03 DIAGNOSIS — E559 Vitamin D deficiency, unspecified: Secondary | ICD-10-CM | POA: Diagnosis not present

## 2024-01-03 DIAGNOSIS — G4733 Obstructive sleep apnea (adult) (pediatric): Secondary | ICD-10-CM | POA: Diagnosis not present

## 2024-01-03 DIAGNOSIS — R7303 Prediabetes: Secondary | ICD-10-CM | POA: Diagnosis not present

## 2024-01-16 ENCOUNTER — Other Ambulatory Visit: Payer: Self-pay | Admitting: General Surgery

## 2024-01-16 DIAGNOSIS — K612 Anorectal abscess: Secondary | ICD-10-CM

## 2024-01-19 ENCOUNTER — Ambulatory Visit
Admission: RE | Admit: 2024-01-19 | Discharge: 2024-01-19 | Disposition: A | Discharge status: Home / Self Care | Source: Ambulatory Visit | Attending: General Surgery | Admitting: General Surgery

## 2024-01-19 DIAGNOSIS — M7989 Other specified soft tissue disorders: Secondary | ICD-10-CM | POA: Diagnosis not present

## 2024-01-19 DIAGNOSIS — K612 Anorectal abscess: Secondary | ICD-10-CM

## 2024-01-19 MED ORDER — IOPAMIDOL (ISOVUE-300) INJECTION 61%
100.0000 mL | Freq: Once | INTRAVENOUS | Status: AC | PRN
  Administered 2024-01-19: 100 mL via INTRAVENOUS

## 2024-02-01 DIAGNOSIS — G4733 Obstructive sleep apnea (adult) (pediatric): Secondary | ICD-10-CM | POA: Diagnosis not present

## 2024-02-06 DIAGNOSIS — R7303 Prediabetes: Secondary | ICD-10-CM | POA: Diagnosis not present

## 2024-02-06 DIAGNOSIS — K5900 Constipation, unspecified: Secondary | ICD-10-CM | POA: Diagnosis not present

## 2024-02-06 DIAGNOSIS — G4733 Obstructive sleep apnea (adult) (pediatric): Secondary | ICD-10-CM | POA: Diagnosis not present

## 2024-02-06 DIAGNOSIS — R030 Elevated blood-pressure reading, without diagnosis of hypertension: Secondary | ICD-10-CM | POA: Diagnosis not present

## 2024-02-20 DIAGNOSIS — K603 Anal fistula, unspecified: Secondary | ICD-10-CM | POA: Diagnosis not present

## 2024-02-20 NOTE — Progress Notes (Signed)
 REFERRING PHYSICIAN:  Polly Cordella Righter,*  PROVIDER:  LONNI OZELL PIZZA, MD  MRN: [id number] DOB: 19-Nov-1960 DATE OF ENCOUNTER: 02/20/2024  Subjective   Chief Complaint: Possible anal fistula   History of Present Illness: Heather Hayes is a 63 y.o. female with history of GERD, breast cancer (2020), whom is seen in the office today as a referral by Dr. Polly for evaluation of possible anal fistula.    She was taken to the operating room by Dr. Polly 05/22/2023 for drainage of perirectal abscess and EUA.  CT Pelvis 05/2023 -left medial buttock inflammatory changes into the ischial anal fossa with multiple gas locules and stranding  She recovered well.  While showering, she felt a tunnel extending towards the anal canal and had called the office.  Subsequently, was given a course of Augmentin  and a CT.  CT Pelvis 01/19/24 1. Linear soft tissue density in the subcutaneous tissues extending from the left anterolateral aspect of the anal verge to the skin surface in the medial left gluteal region, most consistent with perianal fistula. No fluid collection or abscess.   She reports that she has had a few episodes over the last couple of months where she would develop a blood blister that would pop and spontaneously drained some fluid.  She has had some persistent drainage since that time.  She has a wound on the left side near her I&D site that she states just never seems to close.  Denies any issues with incontinence to gas, liquid, or solid stool.  Last colonoscopy 05/2019 - Dr. Charlanne: -Colonic polyps status post polypectomy-path showed tubular adenoma, hyperplastic polyp - Mild sigmoid diverticulosis - Small internal and external hemorrhoids - Exam otherwise normal  PMH: GERD, breast cancer (2020)  PSH: I&D perirectal abscess 05/2023  FHx: Denies any known family history of colorectal, breast, endometrial or ovarian cancer  Social Hx: Denies use of  tobacco/EtOH/illicit drug.  She works for a [employer].  She is here today by herself.   Review of Systems: A complete review of systems was obtained from the patient.  I have reviewed this information and discussed as appropriate with the patient.  See HPI as well for other ROS.  All of the below systems have been reviewed with the patient and positives are indicated with bold text General: chills, fever or night sweats Eyes: blurry vision or double vision ENT: epistaxis or sore throat Allergy/Immunology: itchy/watery eyes or nasal congestion Hematologic/Lymphatic: bleeding problems, blood clots or swollen lymph nodes Endocrine: temperature intolerance or unexpected weight changes Breast: new or changing breast lumps or nipple discharge Resp: cough, shortness of breath, or wheezing CV: chest pain or dyspnea on exertion GI: as per HPI GU: dysuria, trouble voiding, or hematuria MSK: joint pain or joint stiffness Neuro: TIA or stroke symptoms Derm: pruritus and skin lesion changes Psych: anxiety and depression   Medical History: Past Medical History:  Diagnosis Date  . Anemia   . Arthritis   . History of cancer   . Sleep apnea     There is no problem list on file for this patient.   Past Surgical History:  Procedure Laterality Date  . Cervical spine surgery  2009  . Breast lumpectomy with radioactive seed and sentinel lymph node biopsy Right 10/27/2018   Dr Mikell  . Breast lumpectomy Right 10/27/2018  . Incision and drainage perirectal abscess  05/22/2023   Dr Polly  . COLONOSCOPY    . HYSTEROSCOPY W/  ENDOMETRIAL ABLATION    . LAPAROSCOPIC TUBAL LIGATION    . Polypectomy       Allergies  Allergen Reactions  . Prochlorperazine Other (See Comments)    Muscle contractures    Current Outpatient Medications on File Prior to Visit  Medication Sig Dispense Refill  . anastrozole  (ARIMIDEX ) 1 mg tablet 1 tablet  Orally Once a day    . cholecalciferol (VITAMIN D3) 1000 unit tablet Take 1,000 Units by mouth once daily    . clonazePAM  (KLONOPIN ) 0.5 MG tablet TAKE 1/2 - 1 TABLET ORALLY TWICE A DAY AS NEEDED FOR ANXIETY 90 DAYS    . escitalopram  oxalate (LEXAPRO ) 10 MG tablet 1 tablet Orally Once a day    . meloxicam  (MOBIC ) 15 MG tablet 1 tablet Orally Once a day as needed for 30 days (Patient not taking: Reported on 02/20/2024)     No current facility-administered medications on file prior to visit.    Family History  Problem Relation Age of Onset  . Obesity Mother   . High blood pressure (Hypertension) Mother   . Hyperlipidemia (Elevated cholesterol) Mother   . Diabetes Mother   . Breast cancer Mother   . Skin cancer Father   . Obesity Father   . High blood pressure (Hypertension) Father   . Hyperlipidemia (Elevated cholesterol) Father   . Diabetes Father   . Skin cancer Sister   . Obesity Sister   . High blood pressure (Hypertension) Sister   . Hyperlipidemia (Elevated cholesterol) Sister   . Diabetes Sister      Social History   Tobacco Use  Smoking Status Never  Smokeless Tobacco Never     Social History   Socioeconomic History  . Marital status: Married  Tobacco Use  . Smoking status: Never  . Smokeless tobacco: Never  Vaping Use  . Vaping status: Never Used  Substance and Sexual Activity  . Alcohol use: Yes  . Drug use: Never   Social Drivers of Health   Food Insecurity: No Food Insecurity (05/22/2023)   Received from [facility]   Hunger Vital Sign   . Within the past 12 months, you worried that your food would run out before you got the money to buy more.: Never true   . Within the past 12 months, the food you bought just didn't last and you didn't have money to get more.: Never true  Transportation Needs: No Transportation Needs (05/22/2023)   Received from [facility] - Transportation   . Lack of Transportation (Medical): No   . Lack of  Transportation (Non-Medical): No  Housing Stability: Unknown (02/20/2024)   Housing Stability Vital Sign   . Homeless in the Last Year: No    Objective:    Vitals:   02/20/24 1444  PainSc: 0-No pain    There is no height or weight on file to calculate BMI.  Constitutional: NAD; conversant Eyes: Moist conjunctiva; anicteric Lungs: Normal respiratory effort CV: RRR; no pitting edema Anorectal: Normal perianal skin with the exception of a punctate opening on the left lateral side within the anal margin.  No fluctuance or erythema.  Palpable cord emanating towards the anal canal.  DRE-normal tone/squeeze.  Some scarring is palpable in the anterior midline ? internal opening of fistula Anoscopy: Circumferential anoscopy demonstrates healthy appearing anoderm.  No fissures.  No ulcerations or granulation tissue.  No obvious internal opening per se but there is significant redundant hemorrhoidal tissue somewhat limiting evaluation. Psychiatric: Appropriate affect  Procedure: Anoscopy We spent time reviewing the procedure, material risks (including but not limited to perianal pain, bleeding) benefits and alternatives.  Witnessed, informed consent was obtained.  Narrative:  The lubricated anoscope was gently inserted into the anal canal. Circumferential anoscopy demonstrates findings noted above  A chaperone, Chemira Jones CMA, was present for this encounter and examination  Assessment and Plan:  Diagnoses and all orders for this visit:  Anal fistula     Heather Hayes is a very pleasant 63 y.o. female with hx of GERD, breast cancer (2020), perirectal abscess here for evaluation of possible anal fistula, left  -The anatomy and physiology of the anal canal was discussed with the patient with associated pictures. The pathophysiology of anal abscess/fistula was discussed at length with associated pictures and illustrations using our ASCRS trifold handout. -We have reviewed options going  forward including further observation vs surgery -surgical treatment of intersphincteric/transsphincteric anal fistula with placement of draining seton versus fistulotomy; anorectal exam under anesthesia -Discussed ultimate procedure based upon intraoperative findings and fistula subtype.  We discussed situations where a draining seton could be indicated and general expectations therein.  We discussed that this could require multiple procedures to address.  We discussed success rates of definitive management of deeper fistulas that required draining setons in the vicinity of 80% with a 20% recurrence rate.  -The planned procedure, material risks (including, but not limited to, pain, bleeding, infection, scarring, need for blood transfusion, damage to anal sphincter, incontinence of gas and/or stool, need for additional procedures, anal stenosis, rare cases of pelvic sepsis which in severe cases may require things like a colostomy, recurrence, pneumonia, heart attack, stroke, death) benefits and alternatives to surgery were discussed at length. I noted a good probability that the procedure would help improve their symptoms. The patient's questions were answered to her satisfaction, she voiced understanding and elected to proceed with surgery. Additionally, we discussed typical postoperative expectations and the recovery process.  Return for Postop appointment 3-5 weeks after surgery.  I spent a total of 40 minutes today in both face-to-face and non-face-to-face activities to perform the following: review records, take history, perform exam, counsel the patient on the diagnosis, and document encounter, findings, and plan in the EHR  for this visit on the date of this encounter.  Lonni Pizza, MD [facility], [facility]

## 2024-02-22 ENCOUNTER — Other Ambulatory Visit: Payer: Self-pay

## 2024-02-22 ENCOUNTER — Encounter (HOSPITAL_BASED_OUTPATIENT_CLINIC_OR_DEPARTMENT_OTHER): Payer: Self-pay | Admitting: Surgery

## 2024-02-27 ENCOUNTER — Ambulatory Visit: Payer: Self-pay | Admitting: Surgery

## 2024-02-29 ENCOUNTER — Other Ambulatory Visit: Payer: Self-pay

## 2024-02-29 ENCOUNTER — Encounter (HOSPITAL_BASED_OUTPATIENT_CLINIC_OR_DEPARTMENT_OTHER): Payer: Self-pay | Admitting: Surgery

## 2024-02-29 ENCOUNTER — Encounter (HOSPITAL_BASED_OUTPATIENT_CLINIC_OR_DEPARTMENT_OTHER)
Admission: RE | Disposition: A | Discharge status: Home / Self Care | Payer: Self-pay | Source: Home / Self Care | Attending: Surgery

## 2024-02-29 ENCOUNTER — Ambulatory Visit (HOSPITAL_BASED_OUTPATIENT_CLINIC_OR_DEPARTMENT_OTHER): Payer: Self-pay | Admitting: Anesthesiology

## 2024-02-29 ENCOUNTER — Ambulatory Visit (HOSPITAL_BASED_OUTPATIENT_CLINIC_OR_DEPARTMENT_OTHER)
Admission: RE | Admit: 2024-02-29 | Discharge: 2024-02-29 | Disposition: A | Discharge status: Home / Self Care | Attending: Surgery | Admitting: Surgery

## 2024-02-29 DIAGNOSIS — E785 Hyperlipidemia, unspecified: Secondary | ICD-10-CM | POA: Diagnosis not present

## 2024-02-29 DIAGNOSIS — K603 Anal fistula, unspecified: Secondary | ICD-10-CM | POA: Diagnosis not present

## 2024-02-29 DIAGNOSIS — K60329 Anal fistula, complex, unspecified: Secondary | ICD-10-CM | POA: Diagnosis not present

## 2024-02-29 DIAGNOSIS — Z853 Personal history of malignant neoplasm of breast: Secondary | ICD-10-CM | POA: Diagnosis not present

## 2024-02-29 DIAGNOSIS — G4730 Sleep apnea, unspecified: Secondary | ICD-10-CM | POA: Diagnosis not present

## 2024-02-29 DIAGNOSIS — K648 Other hemorrhoids: Secondary | ICD-10-CM | POA: Insufficient documentation

## 2024-02-29 DIAGNOSIS — K5730 Diverticulosis of large intestine without perforation or abscess without bleeding: Secondary | ICD-10-CM | POA: Diagnosis not present

## 2024-02-29 DIAGNOSIS — F32A Depression, unspecified: Secondary | ICD-10-CM | POA: Insufficient documentation

## 2024-02-29 DIAGNOSIS — M1990 Unspecified osteoarthritis, unspecified site: Secondary | ICD-10-CM | POA: Insufficient documentation

## 2024-02-29 DIAGNOSIS — K644 Residual hemorrhoidal skin tags: Secondary | ICD-10-CM | POA: Diagnosis not present

## 2024-02-29 DIAGNOSIS — F419 Anxiety disorder, unspecified: Secondary | ICD-10-CM | POA: Diagnosis not present

## 2024-02-29 DIAGNOSIS — K219 Gastro-esophageal reflux disease without esophagitis: Secondary | ICD-10-CM | POA: Insufficient documentation

## 2024-02-29 DIAGNOSIS — Z01818 Encounter for other preprocedural examination: Secondary | ICD-10-CM

## 2024-02-29 SURGERY — EXAM UNDER ANESTHESIA, RECTUM
Anesthesia: General | Site: Rectum

## 2024-02-29 MED ORDER — DEXMEDETOMIDINE HCL IN NACL 80 MCG/20ML IV SOLN
INTRAVENOUS | Status: DC | PRN
  Administered 2024-02-29 (×2): 4 ug via INTRAVENOUS

## 2024-02-29 MED ORDER — ACETAMINOPHEN 500 MG PO TABS
1000.0000 mg | ORAL_TABLET | ORAL | Status: AC
  Administered 2024-02-29: 1000 mg via ORAL

## 2024-02-29 MED ORDER — MIDAZOLAM HCL 2 MG/2ML IJ SOLN
INTRAMUSCULAR | Status: AC
  Filled 2024-02-29: qty 2

## 2024-02-29 MED ORDER — FENTANYL CITRATE (PF) 100 MCG/2ML IJ SOLN: 25.0000 ug | INTRAMUSCULAR | Status: DC | PRN

## 2024-02-29 MED ORDER — PROPOFOL 10 MG/ML IV BOLUS
INTRAVENOUS | Status: DC | PRN
  Administered 2024-02-29: 150 mg via INTRAVENOUS
  Administered 2024-02-29: 20 mg via INTRAVENOUS

## 2024-02-29 MED ORDER — CEFAZOLIN SODIUM-DEXTROSE 2-4 GM/100ML-% IV SOLN
INTRAVENOUS | Status: AC
  Filled 2024-02-29: qty 100

## 2024-02-29 MED ORDER — DEXAMETHASONE SODIUM PHOSPHATE 10 MG/ML IJ SOLN
INTRAMUSCULAR | Status: DC | PRN
  Administered 2024-02-29: 10 mg via INTRAVENOUS

## 2024-02-29 MED ORDER — BUPIVACAINE LIPOSOME 1.3 % IJ SUSP
INTRAMUSCULAR | Status: AC
  Filled 2024-02-29: qty 20

## 2024-02-29 MED ORDER — ACETAMINOPHEN 500 MG PO TABS: 1000.0000 mg | ORAL_TABLET | Freq: Once | ORAL | Status: DC

## 2024-02-29 MED ORDER — CHLORHEXIDINE GLUCONATE CLOTH 2 % EX PADS: 6.0000 | MEDICATED_PAD | Freq: Once | CUTANEOUS | Status: DC

## 2024-02-29 MED ORDER — FLEET ENEMA RE ENEM
ENEMA | RECTAL | Status: AC
  Filled 2024-02-29: qty 1

## 2024-02-29 MED ORDER — MIDAZOLAM HCL 2 MG/2ML IJ SOLN
INTRAMUSCULAR | Status: DC | PRN
  Administered 2024-02-29: 2 mg via INTRAVENOUS

## 2024-02-29 MED ORDER — ACETAMINOPHEN 500 MG PO TABS
ORAL_TABLET | ORAL | Status: AC
  Filled 2024-02-29: qty 2

## 2024-02-29 MED ORDER — FENTANYL CITRATE (PF) 100 MCG/2ML IJ SOLN
INTRAMUSCULAR | Status: DC | PRN
  Administered 2024-02-29: 100 ug via INTRAVENOUS

## 2024-02-29 MED ORDER — FENTANYL CITRATE (PF) 100 MCG/2ML IJ SOLN
INTRAMUSCULAR | Status: AC
  Filled 2024-02-29: qty 2

## 2024-02-29 MED ORDER — SUGAMMADEX SODIUM 200 MG/2ML IV SOLN
INTRAVENOUS | Status: DC | PRN
Start: 2024-02-29 — End: 2024-02-29
  Administered 2024-02-29: 180 mg via INTRAVENOUS

## 2024-02-29 MED ORDER — OXYCODONE HCL 5 MG PO TABS: 5.0000 mg | ORAL_TABLET | Freq: Once | ORAL | Status: DC | PRN

## 2024-02-29 MED ORDER — ROCURONIUM BROMIDE 10 MG/ML (PF) SYRINGE
PREFILLED_SYRINGE | INTRAVENOUS | Status: DC | PRN
  Administered 2024-02-29: 50 mg via INTRAVENOUS

## 2024-02-29 MED ORDER — METHYLENE BLUE (ANTIDOTE) 1 % IV SOLN
INTRAVENOUS | Status: AC
  Filled 2024-02-29: qty 10

## 2024-02-29 MED ORDER — ONDANSETRON HCL 4 MG/2ML IJ SOLN
INTRAMUSCULAR | Status: DC | PRN
  Administered 2024-02-29: 4 mg via INTRAVENOUS

## 2024-02-29 MED ORDER — TRAMADOL HCL 50 MG PO TABS
50.0000 mg | ORAL_TABLET | Freq: Four times a day (QID) | ORAL | 0 refills | Status: AC | PRN
Start: 2024-02-29 — End: 2024-03-05

## 2024-02-29 MED ORDER — FLEET ENEMA RE ENEM
1.0000 | ENEMA | Freq: Once | RECTAL | Status: AC
  Administered 2024-02-29: 1 via RECTAL

## 2024-02-29 MED ORDER — AMISULPRIDE (ANTIEMETIC) 5 MG/2ML IV SOLN: 10.0000 mg | Freq: Once | INTRAVENOUS | Status: DC | PRN

## 2024-02-29 MED ORDER — OXYCODONE HCL 5 MG/5ML PO SOLN: 5.0000 mg | Freq: Once | ORAL | Status: DC | PRN

## 2024-02-29 MED ORDER — BUPIVACAINE-EPINEPHRINE (PF) 0.25% -1:200000 IJ SOLN
INTRAMUSCULAR | Status: AC
  Filled 2024-02-29: qty 30

## 2024-02-29 MED ORDER — LIDOCAINE 2% (20 MG/ML) 5 ML SYRINGE
INTRAMUSCULAR | Status: DC | PRN
  Administered 2024-02-29: 80 mg via INTRAVENOUS

## 2024-02-29 MED ORDER — BUPIVACAINE-EPINEPHRINE 0.25% -1:200000 IJ SOLN
INTRAMUSCULAR | Status: DC | PRN
  Administered 2024-02-29: 30 mL

## 2024-02-29 MED ORDER — LACTATED RINGERS IV SOLN: INTRAVENOUS | Status: DC

## 2024-02-29 MED ORDER — EPHEDRINE SULFATE-NACL 50-0.9 MG/10ML-% IV SOSY
PREFILLED_SYRINGE | INTRAVENOUS | Status: DC | PRN
  Administered 2024-02-29 (×3): 5 mg via INTRAVENOUS

## 2024-02-29 SURGICAL SUPPLY — 37 items
BENZOIN TINCTURE PRP APPL 2/3 (GAUZE/BANDAGES/DRESSINGS) ×1 IMPLANT
BLADE EXTENDED COATED 6.5IN (ELECTRODE) ×2 IMPLANT
BRIEF MESH DISP 2XL (UNDERPADS AND DIAPERS) ×2 IMPLANT
COVER BACK TABLE 60X90IN (DRAPES) ×2 IMPLANT
COVER MAYO STAND STRL (DRAPES) ×2 IMPLANT
DRAPE LAPAROTOMY 100X72 PEDS (DRAPES) ×2 IMPLANT
DRAPE UTILITY XL STRL (DRAPES) ×2 IMPLANT
ELECTRODE REM PT RTRN 9FT ADLT (ELECTROSURGICAL) ×2 IMPLANT
GAUZE 4X4 16PLY ~~LOC~~+RFID DBL (SPONGE) ×2 IMPLANT
GAUZE PAD ABD 8X10 STRL (GAUZE/BANDAGES/DRESSINGS) ×1 IMPLANT
GAUZE SPONGE 4X4 12PLY STRL (GAUZE/BANDAGES/DRESSINGS) ×2 IMPLANT
GLOVE BIO SURGEON STRL SZ7.5 (GLOVE) ×2 IMPLANT
GLOVE INDICATOR 8.0 STRL GRN (GLOVE) ×2 IMPLANT
GLOVE SURG SS PI 6.0 STRL IVOR (GLOVE) ×1 IMPLANT
GOWN STRL REUS W/ TWL LRG LVL3 (GOWN DISPOSABLE) ×1 IMPLANT
GOWN STRL REUS W/TWL XL LVL3 (GOWN DISPOSABLE) ×2 IMPLANT
IV CATH 14GX2 1/4 (CATHETERS) ×1 IMPLANT
LOOP VASCLR MAXI BLUE 18IN ST (MISCELLANEOUS) IMPLANT
LOOPS VASCLR MAXI BLUE 18IN ST (MISCELLANEOUS) ×2 IMPLANT
NDL HYPO 22X1.5 SAFETY MO (MISCELLANEOUS) ×1 IMPLANT
NEEDLE HYPO 22X1.5 SAFETY MO (MISCELLANEOUS) ×2 IMPLANT
NS IRRIG 500ML POUR BTL (IV SOLUTION) ×2 IMPLANT
PACK BASIN DAY SURGERY FS (CUSTOM PROCEDURE TRAY) ×2 IMPLANT
PENCIL SMOKE EVACUATOR (MISCELLANEOUS) ×2 IMPLANT
SLEEVE SCD COMPRESS KNEE MED (STOCKING) ×2 IMPLANT
SURGILUBE 2OZ TUBE FLIPTOP (MISCELLANEOUS) ×1 IMPLANT
SUT SILK 0 TIES 10X30 (SUTURE) ×2 IMPLANT
SUT SILK 2 0 SH (SUTURE) ×1 IMPLANT
SUT VIC AB 2-0 UR6 27 (SUTURE) ×2 IMPLANT
SUT VIC AB 3-0 SH 27X BRD (SUTURE) ×2 IMPLANT
SYR 10ML LL (SYRINGE) ×1 IMPLANT
SYR BULB EAR ULCER 3OZ GRN STR (SYRINGE) ×1 IMPLANT
SYR CONTROL 10ML LL (SYRINGE) ×2 IMPLANT
TOWEL GREEN STERILE FF (TOWEL DISPOSABLE) ×2 IMPLANT
TRAY DSU PREP LF (CUSTOM PROCEDURE TRAY) ×2 IMPLANT
TUBE CONNECTING 20X1/4 (TUBING) ×2 IMPLANT
YANKAUER SUCT BULB TIP NO VENT (SUCTIONS) ×2 IMPLANT

## 2024-02-29 NOTE — Op Note (Signed)
 02/29/2024  12:27 PM  PATIENT:  Heather Hayes  64 y.o. female  Patient Care Team: Heather Elsie JONELLE, MD as PCP - General (Family Medicine) Glean Stephane BROCKS, RN (Inactive) as Oncology Nurse Navigator Tyree Nanetta SAILOR, RN as Oncology Nurse Navigator Lanny Callander, MD as Consulting Physician (Hematology) Shannon Agent, MD as Consulting Physician (Radiation Oncology) Burton, Lacie K, NP as Nurse Practitioner (Nurse Practitioner)  PRE-OPERATIVE DIAGNOSIS: Anal fistula  POST-OPERATIVE DIAGNOSIS: Transsphincteric anal fistula  PROCEDURE:  Surgical treatment of transsphincteric anal fistula with placement of draining seton Anorectal exam under anesthesia  SURGEON:  Surgeon(s): Lucca Ballo, Lonni HERO, MD  ASSISTANT: Mae Flock MD   ANESTHESIA:   local and general  SPECIMEN:  No Specimen  DISPOSITION OF SPECIMEN:  N/A  COUNTS:  Sponge, needle, and instrument counts were reported correct x2 at conclusion.  EBL: 2 mL  Drains: Blue vessel loop draining seton  PLAN OF CARE: Discharge to home after PACU  PATIENT DISPOSITION:  PACU - hemodynamically stable.  OR FINDINGS: Left anterior transsphincteric anal fistula-internal opening at the level of the dentate in the anterior midline; external opening 4 to 5 cm from the anal os in the left anterior position.  Scarring in the anterior midline with sentinel tag and hypertrophic anal papilla consistent with prior chronic anal fissure.  There is no active anal fissure at this time however.  DESCRIPTION: The patient was identified in the preoperative holding area and taken to the OR. SCDs were applied.  She then underwent general endotracheal anesthesia without difficulty. The patient was then rolled onto the OR table in the prone jackknife position. Pressure points were then evaluated and padded. Benzoin was applied to the buttocks and they were gently taped apart.  The was then prepped and draped in usual sterile fashion.  A surgical timeout was  performed indicating the correct patient, procedure, and positioning.  A perianal block was then created using a dilute mixture of 0.25% Marcaine  with epinephrine  and Exparel .  After ascertaining an appropriate level of anesthesia had been achieved, a well lubricated digital rectal exam was performed. This demonstrated palpable nodular area in the anterior midline at the level of the dentate.  Remainder of the anorectal exam was normal on DRE.SABRA  A Hill-Ferguson anoscope was into the anal canal and circumferential inspection demonstrated healthy appearing anoderm with some scarring noted in the anterior midline consistent with a prior anal fissure.  There is also a hypertrophic anal papilla and sentinel tag at this location.    Externally, in the left anterior position approximately 4 to 5 cm from the anal os there is an obvious external opening to an anal fistula with granulation tissue.  With the anoscope in place, this tract is carefully accessed with a semirigid fistula probe.  Care is taken to avoid any false passages.  This is carefully advanced and the fistula is well-defined with an internal opening at the dentate towards the top of her likely prior anal fissure.  The tract is palpated.  It is found to involve both the internal and external sphincter muscle complexes and therefore transsphincteric in nature.  The fistula probe was then exchanged for a blue vessel loop draining seton.  This was secured to itself using 0 silk ties x 3.  No other perianal pathology is appreciated.  There are no palpable cords anywhere else around the anal opening.  Additional local anesthetic is infiltrated.  Skin tag.  The area is washed and dried.  A dressing consisting of  4 x 4's, ABD, mesh underwear were ultimately placed.  She was rolled back onto stretcher, awakened from anesthesia, extubated, and transported to recovery in satisfactory condition.  DISPOSITION: PACU in satisfactory condition.

## 2024-02-29 NOTE — Anesthesia Procedure Notes (Signed)
 Procedure Name: Intubation Date/Time: 02/29/2024 11:43 AM  Performed by: Leotha Andrez DEL, CRNAPre-anesthesia Checklist: Patient identified, Emergency Drugs available, Suction available and Patient being monitored Patient Re-evaluated:Patient Re-evaluated prior to induction Oxygen Delivery Method: Circle System Utilized Preoxygenation: Pre-oxygenation with 100% oxygen Induction Type: IV induction Ventilation: Mask ventilation without difficulty Laryngoscope Size: Glidescope and 3 Grade View: Grade I Tube type: Oral Tube size: 7.0 mm Number of attempts: 1 Airway Equipment and Method: Video-laryngoscopy and Rigid stylet Placement Confirmation: ETT inserted through vocal cords under direct vision, positive ETCO2 and breath sounds checked- equal and bilateral Secured at: 22 cm Tube secured with: Tape Dental Injury: Teeth and Oropharynx as per pre-operative assessment  Difficulty Due To: Difficulty was anticipated and Difficult Airway- due to reduced neck mobility Comments: Elective glidescope due to prior cervical surgery.

## 2024-02-29 NOTE — Anesthesia Postprocedure Evaluation (Signed)
 Anesthesia Post Note  Patient: Heather Hayes  Procedure(s) Performed: EXAM UNDER ANESTHESIA, RECTUM (Rectum) PLACEMENT, SETON (Rectum)     Patient location during evaluation: PACU Anesthesia Type: General Level of consciousness: awake and alert Pain management: pain level controlled Vital Signs Assessment: post-procedure vital signs reviewed and stable Respiratory status: spontaneous breathing, nonlabored ventilation, respiratory function stable and patient connected to nasal cannula oxygen Cardiovascular status: blood pressure returned to baseline and stable Postop Assessment: no apparent nausea or vomiting Anesthetic complications: no   No notable events documented.  Last Vitals:  Vitals:   02/29/24 1300 02/29/24 1330  BP: 130/63 126/67  Pulse: 71 80  Resp: (!) 26 18  Temp:  (!) 36.3 C  SpO2: 95% 98%    Last Pain:  Vitals:   02/29/24 1330  TempSrc:   PainSc: 0-No pain                 Savir Blanke L Kamrie Fanton

## 2024-02-29 NOTE — Discharge Instructions (Addendum)
 ANORECTAL SURGERY: POST OP INSTRUCTIONS A transsphincteric anal fistula was identified. This was controlled with a draining seton. You may bathe normally/get everything wet. Drainage at the location where this resides is normal and expected and typically manageable with a gauze pad, typically changing 1-2x/day. We will discuss the potential next procedure at your postoperative follow-up visit  DIET: Follow a light bland diet the first 24 hours after arrival home, such as soup, liquids, crackers, etc.  Be sure to include lots of fluids daily.  Avoid fast food or heavy meals as your are more likely to get nauseated.  Eat a low fat diet the next few days after surgery.   Some bleeding with bowel movements is expected for the first couple of days but this should stop in between bowel movements  Take your usually prescribed home medications unless otherwise directed. No foreign bodies per rectum for the next 3 months (enemas, etc)  PAIN CONTROL: It is helpful to take an over-the-counter pain medication regularly for the first few days/weeks.  Choose from the following that works best for you: Ibuprofen (Advil, etc) Three 200mg  tabs every 6 hours as needed. Acetaminophen  (Tylenol , etc) 500-650mg  every 6 hours as needed NOTE: You may take both of these medications together - most patients find it most helpful when alternating between the two (i.e. Ibuprofen at 6am, tylenol  at 9am, ibuprofen at 12pm ..SABRA) A  prescription for pain medication may have been prescribed for you at discharge.  Take your pain medication as prescribed.  If you are having problems/concerns with the prescription medicine, please call us  for further advice.  Avoid getting constipated.  Between the surgery and the pain medications, it is common to experience some constipation.  Increasing fluid intake (64oz of water per day) and taking a fiber supplement (such as Metamucil, Citrucel, FiberCon) 1-2 times a day regularly will usually help  prevent this problem from occurring.  Take Miralax (over the counter) 1-2x/day while taking a narcotic pain medication. If no bowel movement after 48hours, you may additionally take a laxative like a bottle of Milk of Magnesia which can be purchased over the counter. Avoid enemas.   Watch out for diarrhea.  If you have many loose bowel movements, simplify your diet to bland foods.  Stop any stool softeners and decrease your fiber supplement. If this worsens or does not improve, please call us .  Wash / shower every day.  If you were discharged with a dressing, you may remove this the day after your surgery. You may shower normally, getting soap/water on your wound, particularly after bowel movements.  Soaking in a warm bath filled a couple inches (Sitz bath) is a great way to clean the area after a bowel movement and many patients find it is a way to soothe the area.  ACTIVITIES as tolerated:   You may resume regular (light) daily activities beginning the next day--such as daily self-care, walking, climbing stairs--gradually increasing activities as tolerated.  If you can walk 30 minutes without difficulty, it is safe to try more intense activity such as jogging, treadmill, bicycling, low-impact aerobics, etc. Refrain from any heavy lifting or straining for the first 2 weeks after your procedure, particularly if your surgery was for hemorrhoids. Avoid activities that make your pain worse You may drive when you are no longer taking prescription pain medication, you can comfortably wear a seatbelt, and you can safely maneuver your car and apply brakes.  FOLLOW UP in our office Please call [facility] at [phone]  [phone] to set up an appointment to see your surgeon in the office for a follow-up appointment approximately 2 weeks after your surgery. Make sure that you call for this appointment the day you arrive home to insure a convenient appointment time.  9. If you have disability or family leave forms that  need to be completed, you may have them completed by your primary care physician's office; for return to work instructions, please ask our office staff and they will be happy to assist you in obtaining this documentation   When to call us  [phone]: Poor pain control Reactions / problems with new medications (rash/itching, etc)  Fever over 101.5 F (38.5 C) Inability to urinate Nausea/vomiting Worsening swelling or bruising Continued bleeding from incision. Increased pain, redness, or drainage from the incision  The clinic staff is available to answer your questions during regular business hours (8:30am-5pm).  Please don't hesitate to call and ask to speak to one of our nurses for clinical concerns.   A surgeon from [facility] is always on call at the hospitals   If you have a medical emergency, go to the nearest emergency room or call 911.   [facility] [address], [address], [place], [place]  [address] MAIN: [phone] FAX: [phone] [email or web address]  Post Anesthesia Home Care Instructions  Activity: Get plenty of rest for the remainder of the day. A responsible individual must stay with you for 24 hours following the procedure.  For the next 24 hours, DO NOT: -Drive a car -Advertising copywriter -Drink alcoholic beverages -Take any medication unless instructed by your physician -Make any legal decisions or sign important papers.  Meals: Start with liquid foods such as gelatin or soup. Progress to regular foods as tolerated. Avoid greasy, spicy, heavy foods. If nausea and/or vomiting occur, drink only clear liquids until the nausea and/or vomiting subsides. Call your physician if vomiting continues.  Special Instructions/Symptoms: Your throat may feel dry or sore from the anesthesia or the breathing tube placed in your throat during surgery. If this causes discomfort, gargle with warm salt water.  The discomfort should disappear within 24 hours.  If you had a scopolamine  patch placed behind your ear for the management of post- operative nausea and/or vomiting:  1. The medication in the patch is effective for 72 hours, after which it should be removed.  Wrap patch in a tissue and discard in the trash. Wash hands thoroughly with soap and water. 2. You may remove the patch earlier than 72 hours if you experience unpleasant side effects which may include dry mouth, dizziness or visual disturbances. 3. Avoid touching the patch. Wash your hands with soap and water after contact with the patch.      Next dose of Tyelnol may be taken at 7p

## 2024-02-29 NOTE — Congregational Nurse Program (Signed)
 Pt performing fleets enema  now that was ordered per Dr. Teresa

## 2024-02-29 NOTE — H&P (Signed)
 CC: Here today for surgery  HPI: Heather Hayes is an 63 y.o. female with history of GERD, breast cancer (2020), whom is seen in the office today as a referral by Dr. Polly for evaluation of possible anal fistula.   She was taken to the operating room by Dr. Polly 05/22/2023 for drainage of perirectal abscess and EUA.  CT Pelvis 05/2023 -left medial buttock inflammatory changes into the ischial anal fossa with multiple gas locules and stranding  She recovered well. While showering, she felt a tunnel extending towards the anal canal and had called the office. Subsequently, was given a course of Augmentin  and a CT.  CT Pelvis 01/19/24 1. Linear soft tissue density in the subcutaneous tissues extending from the left anterolateral aspect of the anal verge to the skin surface in the medial left gluteal region, most consistent with perianal fistula. No fluid collection or abscess.  She reports that she has had a few episodes over the last couple of months where she would develop a blood blister that would pop and spontaneously drained some fluid. She has had some persistent drainage since that time. She has a wound on the left side near her I&D site that she states just never seems to close.  Denies any issues with incontinence to gas, liquid, or solid stool.  Last colonoscopy 05/2019 - Dr. Charlanne: -Colonic polyps status post polypectomy-path showed tubular adenoma, hyperplastic polyp - Mild sigmoid diverticulosis - Small internal and external hemorrhoids - Exam otherwise normal  She denies any changes in health or health history since we met in the office. No new medications/allergies. She states she is ready for surgery today.  PMH: GERD, breast cancer (2020)  PSH: I&D perirectal abscess 05/2023  FHx: Denies any known family history of colorectal, breast, endometrial or ovarian cancer  Social Hx: Denies use of tobacco/EtOH/illicit drug. She works for a [employer]. She is here today by herself.   Past Medical History:  Diagnosis Date   Anxiety    Arthritis    Back pain    Breast cancer (HCC)    Bursitis    Cancer (HCC)    right breast cancer 09-2018   Constipation    GERD (gastroesophageal reflux disease)    some heart burn from Arimidex    History of radiation therapy    completed 01-29-2019   Hyperlipidemia    Joint pain    Personal history of radiation therapy    Rheumatic fever    Sleep apnea    uses CPAP nightly   Swallowing difficulty     Past Surgical History:  Procedure Laterality Date   BREAST LUMPECTOMY Right 10/27/2018   BREAST LUMPECTOMY WITH RADIOACTIVE SEED AND SENTINEL LYMPH NODE BIOPSY Right 10/27/2018   Procedure: RIGHT BREAST LUMPECTOMY WITH RADIOACTIVE SEED AND SENTINEL LYMPH NODE BIOPSY;  Surgeon: Mikell Katz, MD;  Location: [facility];  Service: General;  Laterality: Right;   CERVICAL SPINE SURGERY  2009   COLONOSCOPY     ENDOMETRIAL ABLATION     INCISION AND DRAINAGE PERIRECTAL ABSCESS N/A 05/22/2023   Procedure: IRRIGATION AND DEBRIDEMENT PERIRECTAL ABSCESS AND DEBRIDEMENT OF INVOLVED TISSUES;  Surgeon: Polly Cordella LABOR, MD;  Location: [facility] OR;  Service: General;  Laterality: N/A;   POLYPECTOMY     TUBAL LIGATION     tube ligation       Family History  Problem Relation Age of Onset   Breast cancer Mother 21   Diabetes Mother  Hypertension Mother    Hyperlipidemia Mother    Obesity Mother    Diabetes Father    Hypertension Father    Hyperlipidemia Father    Ovarian cancer Maternal Grandmother    Colon cancer Neg Hx    Esophageal cancer Neg Hx    Rectal cancer Neg Hx    Stomach cancer Neg Hx     Social:  reports that she has never smoked. She has never used smokeless tobacco. She reports current alcohol use of about 1.0 - 2.0 standard drink of alcohol per week. She reports that she does not use drugs.  Allergies:  Allergies  Allergen  Reactions   Compazine [Prochlorperazine Edisylate] Other (See Comments)    Muscle contractures     Medications: I have reviewed the patient's current medications.  No results found for this or any previous visit (from the past 48 hours).  No results found.   PE Blood pressure 137/75, temperature (!) 97.3 F (36.3 C), temperature source Temporal, resp. rate 16, height 5' 3 (1.6 m), weight 92.9 kg, SpO2 99%. Constitutional: NAD; conversant Eyes: Moist conjunctiva; no lid lag; anicteric Lungs: Normal respiratory effort CV: RRR Psychiatric: Appropriate affect  No results found for this or any previous visit (from the past 48 hours).  No results found.  A/P: Heather Hayes is an 63 y.o. female with hx of GERD, breast cancer (2020), perirectal abscess here for evaluation of possible anal fistula, left  -The anatomy and physiology of the anal canal was discussed with the patient with associated pictures. The pathophysiology of anal abscess/fistula was discussed at length with associated pictures and illustrations using our ASCRS trifold handout. -We have reviewed options going forward including further observation vs surgery -surgical treatment of intersphincteric/transsphincteric anal fistula with placement of draining seton versus fistulotomy; anorectal exam under anesthesia -Discussed ultimate procedure based upon intraoperative findings and fistula subtype. We discussed situations where a draining seton could be indicated and general expectations therein. We discussed that this could require multiple procedures to address. We discussed success rates of definitive management of deeper fistulas that required draining setons in the vicinity of 80% with a 20% recurrence rate.  -The planned procedure, material risks (including, but not limited to, pain, bleeding, infection, scarring, need for blood transfusion, damage to anal sphincter, incontinence of gas and/or stool, need for additional  procedures, anal stenosis, rare cases of pelvic sepsis which in severe cases may require things like a colostomy, recurrence, pneumonia, heart attack, stroke, death) benefits and alternatives to surgery were discussed at length. I noted a good probability that the procedure would help improve their symptoms. The patient's questions were answered to her satisfaction, she voiced understanding and elected to proceed with surgery. Additionally, we discussed typical postoperative expectations and the recovery process.   Lonni Pizza, MD [facility], [facility]

## 2024-02-29 NOTE — Anesthesia Preprocedure Evaluation (Addendum)
 Anesthesia Evaluation  Patient identified by MRN, date of birth, ID band Patient awake    Reviewed: Allergy & Precautions, NPO status , Patient's Chart, lab work & pertinent test results  Airway Mallampati: III  TM Distance: >3 FB Neck ROM: Full    Dental no notable dental hx. (+) Teeth Intact, Dental Advisory Given   Pulmonary sleep apnea and Continuous Positive Airway Pressure Ventilation    Pulmonary exam normal breath sounds clear to auscultation       Cardiovascular negative cardio ROS Normal cardiovascular exam Rhythm:Regular Rate:Normal     Neuro/Psych  PSYCHIATRIC DISORDERS Anxiety Depression    negative neurological ROS     GI/Hepatic Neg liver ROS,GERD  Controlled,,  Endo/Other  negative endocrine ROS    Renal/GU negative Renal ROS  negative genitourinary   Musculoskeletal  (+) Arthritis ,    Abdominal   Peds  Hematology negative hematology ROS (+)   Anesthesia Other Findings Anal fistula  Reproductive/Obstetrics                              Anesthesia Physical Anesthesia Plan  ASA: 2  Anesthesia Plan: General   Post-op Pain Management: Tylenol  PO (pre-op)*   Induction: Intravenous  PONV Risk Score and Plan: 3 and Midazolam , Dexamethasone  and Ondansetron   Airway Management Planned: Oral ETT  Additional Equipment:   Intra-op Plan:   Post-operative Plan: Extubation in OR  Informed Consent: I have reviewed the patients History and Physical, chart, labs and discussed the procedure including the risks, benefits and alternatives for the proposed anesthesia with the patient or authorized representative who has indicated his/her understanding and acceptance.     Dental advisory given  Plan Discussed with: CRNA  Anesthesia Plan Comments:          Anesthesia Quick Evaluation

## 2024-02-29 NOTE — Transfer of Care (Signed)
 Immediate Anesthesia Transfer of Care Note  Patient: Heather Hayes  Procedure(s) Performed: ERASMO UNDER ANESTHESIA, RECTUM (Rectum) PLACEMENT, SETON (Rectum)  Patient Location: PACU  Anesthesia Type:General  Level of Consciousness: drowsy and patient cooperative  Airway & Oxygen Therapy: Patient Spontanous Breathing and Patient connected to face mask oxygen  Post-op Assessment: Report given to RN and Post -op Vital signs reviewed and stable  Post vital signs: Reviewed and stable  Last Vitals:  Vitals Value Taken Time  BP 108/70 02/29/24 12:35  Temp    Pulse 77 02/29/24 12:37  Resp 14 02/29/24 12:37  SpO2 100 % 02/29/24 12:37  Vitals shown include unfiled device data.  Last Pain:  Vitals:   02/29/24 1003  TempSrc: Temporal  PainSc: 0-No pain      Patients Stated Pain Goal: 5 (02/29/24 1003)  Complications: No notable events documented.

## 2024-03-01 ENCOUNTER — Encounter (HOSPITAL_BASED_OUTPATIENT_CLINIC_OR_DEPARTMENT_OTHER): Payer: Self-pay | Admitting: Surgery

## 2024-03-13 DIAGNOSIS — K219 Gastro-esophageal reflux disease without esophagitis: Secondary | ICD-10-CM | POA: Diagnosis not present

## 2024-03-13 DIAGNOSIS — R1011 Right upper quadrant pain: Secondary | ICD-10-CM | POA: Diagnosis not present

## 2024-03-14 ENCOUNTER — Other Ambulatory Visit: Payer: Self-pay | Admitting: Physician Assistant

## 2024-03-14 ENCOUNTER — Encounter: Payer: Self-pay | Admitting: Physician Assistant

## 2024-03-14 DIAGNOSIS — R1011 Right upper quadrant pain: Secondary | ICD-10-CM

## 2024-03-16 ENCOUNTER — Ambulatory Visit
Admission: RE | Admit: 2024-03-16 | Discharge: 2024-03-16 | Disposition: A | Discharge status: Home / Self Care | Source: Ambulatory Visit | Attending: Physician Assistant | Admitting: Physician Assistant

## 2024-03-16 DIAGNOSIS — R1011 Right upper quadrant pain: Secondary | ICD-10-CM | POA: Diagnosis not present

## 2024-04-25 DIAGNOSIS — M4726 Other spondylosis with radiculopathy, lumbar region: Secondary | ICD-10-CM | POA: Diagnosis not present

## 2024-04-26 ENCOUNTER — Encounter: Payer: Self-pay | Admitting: Gastroenterology

## 2024-04-26 DIAGNOSIS — Z0289 Encounter for other administrative examinations: Secondary | ICD-10-CM

## 2024-04-26 DIAGNOSIS — M4726 Other spondylosis with radiculopathy, lumbar region: Secondary | ICD-10-CM | POA: Diagnosis not present

## 2024-05-02 DIAGNOSIS — G4733 Obstructive sleep apnea (adult) (pediatric): Secondary | ICD-10-CM | POA: Diagnosis not present

## 2024-05-03 DIAGNOSIS — M5441 Lumbago with sciatica, right side: Secondary | ICD-10-CM | POA: Diagnosis not present

## 2024-05-07 ENCOUNTER — Ambulatory Visit: Payer: Self-pay | Admitting: Surgery

## 2024-05-07 DIAGNOSIS — Z01818 Encounter for other preprocedural examination: Secondary | ICD-10-CM

## 2024-05-07 NOTE — Progress Notes (Signed)
 Sent message, via epic in basket, requesting orders in epic from Careers adviser.

## 2024-05-10 ENCOUNTER — Other Ambulatory Visit: Payer: Self-pay

## 2024-05-10 ENCOUNTER — Ambulatory Visit

## 2024-05-10 VITALS — Ht 63.0 in | Wt 207.0 lb

## 2024-05-10 DIAGNOSIS — Z860100 Personal history of colon polyps, unspecified: Secondary | ICD-10-CM

## 2024-05-10 MED ORDER — NA SULFATE-K SULFATE-MG SULF 17.5-3.13-1.6 GM/177ML PO SOLN: 1.0000 | Freq: Once | ORAL | 0 refills | Status: AC

## 2024-05-10 NOTE — Progress Notes (Signed)
 Denies allergies to eggs or soy products. Denies complication of anesthesia or sedation. Denies use of weight loss medication. Denies use of O2.   Emmi instructions given for colonoscopy.

## 2024-05-11 ENCOUNTER — Encounter: Payer: Self-pay | Admitting: Gastroenterology

## 2024-05-11 NOTE — Patient Instructions (Addendum)
 SURGICAL WAITING ROOM VISITATION  Patients having surgery or a procedure may have no more than 2 support people in the waiting area - these visitors may rotate.    Children under the age of 69 must have an adult with them who is not the patient.  Visitors with respiratory illnesses are discouraged from visiting and should remain at home.  If the patient needs to stay at the hospital during part of their recovery, the visitor guidelines for inpatient rooms apply. Pre-op nurse will coordinate an appropriate time for 1 support person to accompany patient in pre-op.  This support person may not rotate.    Please refer to the [facility] website for the visitor guidelines for Inpatients (after your surgery is over and you are in a regular room).       Your procedure is scheduled on: 05-30-24   Report to [facility] Main Entrance    Report to admitting at     0615  AM   Call this number if you have problems the morning of surgery [phone]   Do not eat food   OE DRINK LIQUIDS  :After Midnight.                           If you have questions, please contact your surgeon's office.   FOLLOW BOWEL PREP AND ANY ADDITIONAL PRE OP INSTRUCTIONS YOU RECEIVED FROM YOUR SURGEON'S OFFICE!!!             ONE FLEETS ENEMA THE AM OF SURGERY                                                 Oral Hygiene is also important to reduce your risk of infection.                                    Remember - BRUSH YOUR TEETH THE MORNING OF SURGERY WITH YOUR REGULAR TOOTHPASTE  DENTURES WILL BE REMOVED PRIOR TO SURGERY PLEASE DO NOT APPLY Poly grip OR ADHESIVES!!!   Do NOT smoke after Midnight   Stop all vitamins and herbal supplements 7 days before surgery.   Take these medicines the morning of surgery with A SIP OF WATER: omeprazole, loratadine and klonopin  if needed    Bring CPAP mask and tubing day of surgery.                              You may not have any metal on your body  including hair pins, jewelry, and body piercing             Do not wear make-up, lotions, powders, perfumes/ or deodorant  Do not wear nail polish including gel and S&S, artificial/acrylic nails, or any other type of covering on natural nails including finger and toenails. If you have artificial nails, gel coating, etc. that needs to be removed by a nail salon please have this removed prior to surgery or surgery may need to be canceled/ delayed if the surgeon/ anesthesia feels like they are unable to be safely monitored.   Do not shave  48 hours prior to surgery.  Do not bring valuables to the hospital. [facility] IS NOT             RESPONSIBLE   FOR VALUABLES.   Contacts, glasses, dentures or bridgework may not be worn into surgery.   Bring small overnight bag day of surgery.   DO NOT BRING YOUR HOME MEDICATIONS TO THE HOSPITAL. PHARMACY WILL DISPENSE MEDICATIONS LISTED ON YOUR MEDICATION LIST TO YOU DURING YOUR ADMISSION IN THE HOSPITAL!    Patients discharged on the day of surgery will not be allowed to drive home.  Someone NEEDS to stay with you for the first 24 hours after anesthesia.   Special Instructions: Bring a copy of your healthcare power of attorney and living will documents the day of surgery if you haven't scanned them before.              Please read over the following fact sheets you were given: IF YOU HAVE QUESTIONS ABOUT YOUR PRE-OP INSTRUCTIONS PLEASE CALL [phone].   I If you test positive for Covid or have been in contact with anyone that has tested positive in the last 10 days please notify you surgeon.    [facility] - Preparing for Surgery Before surgery, you can play an important role.  Because skin is not sterile, your skin needs to be as free of germs as possible.  You can reduce the number of germs on your skin by washing with CHG (chlorahexidine gluconate) soap before surgery.  CHG is an antiseptic cleaner which kills germs and bonds with the  skin to continue killing germs even after washing. Please DO NOT use if you have an allergy to CHG or antibacterial soaps.  If your skin becomes reddened/irritated stop using the CHG and inform your nurse when you arrive at Short Stay. Do not shave (including legs and underarms) for at least 48 hours prior to the first CHG shower.  You may shave your face/neck.  Please follow these instructions carefully:  1.  Shower with CHG Soap the night before surgery ONLY (DO NOT USE THE SOAP THE MORNING OF SURGERY).  2.  If you choose to wash your hair, wash your hair first as usual with your normal  shampoo.  3.  After you shampoo, rinse your hair and body thoroughly to remove the shampoo.                             4.  Use CHG as you would any other liquid soap.  You can apply chg directly to the skin and wash.  Gently with a scrungie or clean washcloth.  5.  Apply the CHG Soap to your body ONLY FROM THE NECK DOWN.   Do  not use on face/ open                           Wound or open sores. Avoid contact with eyes, ears mouth and genitals (private parts).                       Wash face,  Genitals (private parts) with your normal soap.             6.  Wash thoroughly, paying special attention to the area where your  surgery  will be performed.  7.  Thoroughly rinse your body with warm water from the neck down.  8.  DO NOT  shower/wash with your normal soap after using and rinsing off the CHG Soap.                9.  Pat yourself dry with a clean towel.            10.  Wear clean pajamas.            11.  Place clean sheets on your bed the night of your first shower and do not  sleep with pets. Day of Surgery : Do not apply any CHG, lotions/deodorants the morning of surgery.  Please wear clean clothes to the hospital/surgery center.  FAILURE TO FOLLOW THESE INSTRUCTIONS MAY RESULT IN THE CANCELLATION OF YOUR SURGERY  PATIENT SIGNATURE_________________________________  NURSE  SIGNATURE__________________________________  ________________________________________________________________________

## 2024-05-11 NOTE — Progress Notes (Addendum)
 PCP - Bonita Harris,MD Cardiologist - no  PPM/ICD -  Device Orders -  Rep Notified -   Chest x-ray -  EKG -  Stress Test -  ECHO -  Cardiac Cath -  CT cardiac scoring- 11-11-23 epic  Sleep Study -  CPAP - yes  Fasting Blood Sugar -  Checks Blood Sugar _o____ times a day  Blood Thinner Instructions:n/a Aspirin Instructions:n/a  ERAS Protcol - PRE-SURGERYn/a   COVID vaccine -  Activity--Able to to climb a fight of stairs with no CP or SOB Anesthesia review: OSA   Patient denies shortness of breath, fever, cough and chest pain at PAT appointment   All instructions explained to the patient, with a verbal understanding of the material. Patient agrees to go over the instructions while at home for a better understanding. Patient also instructed to self quarantine after being tested for COVID-19. The opportunity to ask questions was provided.

## 2024-05-14 ENCOUNTER — Encounter (HOSPITAL_COMMUNITY): Payer: Self-pay

## 2024-05-14 ENCOUNTER — Encounter (HOSPITAL_COMMUNITY)
Admission: RE | Admit: 2024-05-14 | Discharge: 2024-05-14 | Disposition: A | Discharge status: Home / Self Care | Source: Ambulatory Visit | Attending: Surgery | Admitting: Surgery

## 2024-05-14 ENCOUNTER — Other Ambulatory Visit: Payer: Self-pay

## 2024-05-14 DIAGNOSIS — Z01812 Encounter for preprocedural laboratory examination: Secondary | ICD-10-CM | POA: Insufficient documentation

## 2024-05-14 DIAGNOSIS — Z01818 Encounter for other preprocedural examination: Secondary | ICD-10-CM

## 2024-05-14 HISTORY — DX: Prediabetes: R73.03

## 2024-05-14 HISTORY — DX: Pneumonia, unspecified organism: J18.9

## 2024-05-14 LAB — BASIC METABOLIC PANEL WITH GFR
Anion gap: 10 (ref 5–15)
BUN: 14 mg/dL (ref 8–23)
CO2: 25 mmol/L (ref 22–32)
Calcium: 9.9 mg/dL (ref 8.9–10.3)
Chloride: 103 mmol/L (ref 98–111)
Creatinine, Ser: 0.68 mg/dL (ref 0.44–1.00)
GFR, Estimated: >60 mL/min (ref >60)
Glucose, Bld: 92 mg/dL (ref 70–99)
Potassium: 4.8 mmol/L (ref 3.5–5.1)
Sodium: 138 mmol/L (ref 135–145)

## 2024-05-14 LAB — CBC WITH DIFFERENTIAL/PLATELET
Abs Immature Granulocytes: 0.03 K/uL (ref 0.00–0.07)
Basophils Absolute: 0.1 K/uL (ref 0.0–0.1)
Basophils Relative: 1 %
Eosinophils Absolute: 0.2 K/uL (ref 0.0–0.5)
Eosinophils Relative: 3 %
HCT: 41.1 % (ref 36.0–46.0)
Hemoglobin: 13 g/dL (ref 12.0–15.0)
Immature Granulocytes: 1 %
Lymphocytes Relative: 29 %
Lymphs Abs: 1.7 K/uL (ref 0.7–4.0)
MCH: 30.1 pg (ref 26.0–34.0)
MCHC: 31.6 g/dL (ref 30.0–36.0)
MCV: 95.1 fL (ref 80.0–100.0)
Monocytes Absolute: 0.5 K/uL (ref 0.1–1.0)
Monocytes Relative: 9 %
Neutro Abs: 3.5 K/uL (ref 1.7–7.7)
Neutrophils Relative %: 57 %
Platelets: 257 K/uL (ref 150–400)
RBC: 4.32 MIL/uL (ref 3.87–5.11)
RDW: 12.7 % (ref 11.5–15.5)
WBC: 6 K/uL (ref 4.0–10.5)
nRBC: 0 % (ref 0.0–0.2)

## 2024-05-22 ENCOUNTER — Ambulatory Visit: Admitting: Gastroenterology

## 2024-05-22 ENCOUNTER — Encounter: Payer: Self-pay | Admitting: Gastroenterology

## 2024-05-22 VITALS — BP 154/74 | HR 59 | Temp 97.8°F | Resp 10 | Ht 63.0 in | Wt 207.0 lb

## 2024-05-22 DIAGNOSIS — K603 Anal fistula, unspecified: Secondary | ICD-10-CM | POA: Diagnosis not present

## 2024-05-22 DIAGNOSIS — K640 First degree hemorrhoids: Secondary | ICD-10-CM | POA: Diagnosis not present

## 2024-05-22 DIAGNOSIS — K5730 Diverticulosis of large intestine without perforation or abscess without bleeding: Secondary | ICD-10-CM | POA: Diagnosis not present

## 2024-05-22 DIAGNOSIS — Z860100 Personal history of colon polyps, unspecified: Secondary | ICD-10-CM

## 2024-05-22 DIAGNOSIS — Z1211 Encounter for screening for malignant neoplasm of colon: Secondary | ICD-10-CM | POA: Diagnosis not present

## 2024-05-22 DIAGNOSIS — D123 Benign neoplasm of transverse colon: Secondary | ICD-10-CM

## 2024-05-22 MED ORDER — SODIUM CHLORIDE 0.9 % IV SOLN: 500.0000 mL | INTRAVENOUS | Status: DC

## 2024-05-22 NOTE — Progress Notes (Unsigned)
 Office: [phone]  /  Fax: [phone]  WEIGHT SUMMARY AND BIOMETRICS  Weight Lost Since Last Visit: 0  Weight Gained Since Last Visit: 13   Vitals Temp: 98.7 F (37.1 C) BP: 118/74 Pulse Rate: 64 SpO2: 96 %   Anthropometric Measurements Height: 5' 2.5 (1.588 m) Weight: 205 lb (93 kg) BMI (Calculated): 36.87 Weight at Last Visit: 192 lb Weight Lost Since Last Visit: 0 Weight Gained Since Last Visit: 13 Starting Weight: 191 lb Total Weight Loss (lbs): 0 lb (0 kg)   Body Composition  Body Fat %: 48.3 % Fat Mass (lbs): 99 lbs Muscle Mass (lbs): 100.6 lbs Total Body Water (lbs): 78.6 lbs Visceral Fat Rating : 15   Other Clinical Data Fasting: Yes Labs: Yes Today's Visit #: 55 Starting Date: 05/15/19 Comments: Re-starting program      Bio Impedance Data reviewed with patient: 48.3% body fat- aiming for a goal of <35%, visceral fat rating 15- goal of less than 10  HPI  Chief Complaint: OBESITY  Heather Hayes is here to discuss her progress with her obesity treatment plan. She was previously on the the Category 2 Plan andplans to restart today.  She states she is currently doing some walking and yard work for activity   Interval History:   Heather Hayes is restarting the program, her last visit at the clinic was was 09/2023 with a weight of 192.  She is currently up 13 pounds. She previously had an IC of 1187 on 05/20/20- today her IC is 1498 with VO2 of 217 which is a great improvement. Her calculated is 1502 giving her a normal metabolism. She stopped her Anastrazole in 03/2024- weight gain after Perirectal abscess 05/2023 with surgery. In December she developed a rectal fistula. She has surgery 05/30/24 to have fistulaectomy- Dr. Teresa to do surgery. Colonoscopy yesterday and does not need another colonoscopy until 2032. She is still eating 3 meals a day. She has been eating more simple carbohydrates.  She is normally drinking coffee with sugar free creamer, water and  maybe 1 diet soda a day. Protein has been about 30-40 grams a day. She has a torn meniscus in right knee, she does get gel shots.  She does some walking and yardwork.   She has a history of OSA and uses her CPAP 100% of the time. She does have prediabetes, insulin  resistance and pure hypercholesterolemia which she is currently trying to treat with nutrition, exercise and weight loss. She also has a history of Vitamin D  deficiency and is currently on Vit D3 1000 units a day.  PHYSICAL EXAM:  Blood pressure 118/74, pulse 64, temperature 98.7 F (37.1 C), height 5' 2.5 (1.588 m), weight 205 lb (93 kg), SpO2 96%. Body mass index is 36.9 kg/m.  General: Well Developed, well nourished, and in no acute distress.  HEENT: Normocephalic, atraumatic; EOMI, sclerae are anicteric. Skin: Warm and dry, good turgor Chest:  Normal excursion, shape, no gross ABN Respiratory: No conversational dyspnea; speaking in full sentences NeuroM-Sk:  Normal gross ROM * 4 extremities  Psych: A and O X 3, insight adequate, mood- full    DIAGNOSTIC DATA REVIEWED:  BMET    Component Value Date/Time   NA 138 05/14/2024 0855   NA 140 07/04/2023 0829   K 4.8 05/14/2024 0855   CL 103 05/14/2024 0855   CO2 25 05/14/2024 0855   GLUCOSE 92 05/14/2024 0855   BUN 14 05/14/2024 0855   BUN 16 07/04/2023 0829   CREATININE 0.68 05/14/2024 0855  CREATININE 0.76 10/27/2023 0852   CALCIUM 9.9 05/14/2024 0855   GFRNONAA >60 05/14/2024 0855   GFRNONAA >60 10/27/2023 0852   GFRAA 106 05/20/2020 0930   GFRAA >60 04/25/2020 0920   Lab Results  Component Value Date   HGBA1C 5.8 (H) 07/04/2023   HGBA1C 5.4 05/15/2019   Lab Results  Component Value Date   INSULIN  10.6 07/04/2023   INSULIN  14.9 05/15/2019   Lab Results  Component Value Date   TSH 0.769 07/04/2023   CBC    Component Value Date/Time   WBC 6.0 05/14/2024 0855   RBC 4.32 05/14/2024 0855   HGB 13.0 05/14/2024 0855   HGB 12.6 10/27/2023 0852   HGB  12.6 07/04/2023 0829   HCT 41.1 05/14/2024 0855   HCT 39.3 07/04/2023 0829   PLT 257 05/14/2024 0855   PLT 241 10/27/2023 0852   PLT 322 07/04/2023 0829   MCV 95.1 05/14/2024 0855   MCV 97 07/04/2023 0829   MCH 30.1 05/14/2024 0855   MCHC 31.6 05/14/2024 0855   RDW 12.7 05/14/2024 0855   RDW 13.6 07/04/2023 0829   Iron Studies No results found for: IRON, TIBC, FERRITIN, IRONPCTSAT Lipid Panel     Component Value Date/Time   CHOL 244 (H) 07/04/2023 0829   TRIG 85 07/04/2023 0829   HDL 103 07/04/2023 0829   CHOLHDL 2.8 05/20/2020 0930   LDLCALC 127 (H) 07/04/2023 0829   Hepatic Function Panel     Component Value Date/Time   PROT 7.3 10/27/2023 0852   PROT 7.1 07/04/2023 0829   ALBUMIN 4.1 10/27/2023 0852   ALBUMIN 4.3 07/04/2023 0829   AST 15 10/27/2023 0852   ALT 13 10/27/2023 0852   ALKPHOS 86 10/27/2023 0852   BILITOT 0.5 10/27/2023 0852    Nutritional Lab Results  Component Value Date   VD25OH 39.6 07/04/2023   VD25OH 81.0 11/09/2021   VD25OH 86.4 05/26/2021     ASSESSMENT AND PLAN  Class 2 obesity without serious comorbidity with body mass index (BMI) of 36.0 to 36.9 in adult, unspecified obesity type TREATMENT PLAN FOR OBESITY:  Recommended Dietary Goals  Heather Hayes is currently in the action stage of change. As such, her goal is to continue weight management plan. She has agreed to the Category 2 Plan.  Behavioral Intervention  We discussed the following Behavioral Modification Strategies today: continue to work on maintaining a reduced calorie state, getting the recommended amount of protein, incorporating whole foods, making healthy choices, staying well hydrated and practicing mindfulness when eating. and increase protein intake, fibrous foods (25 grams per day for women, 30 grams for men) and water to improve satiety and decrease hunger signals. .  She is given Category 2 meal plan, healthy proteins and complex carbohydrate  handouts.  Recommended Physical Activity Goals  Heather Hayes has been advised to work up to 150 minutes of moderate intensity aerobic activity a week and strengthening exercises 2-3 times per week for cardiovascular health, weight loss maintenance and preservation of muscle mass.   She has agreed to Think about enjoyable ways to increase daily physical activity and overcoming barriers to exercise, Increase physical activity in their day and reduce sedentary time (increase NEAT)., and Start aerobic activity with a goal of 150 minutes a week at moderate intensity.    Pharmacotherapy We discussed various medication options to help Heather Hayes with her weight loss efforts and we both agreed to begin nutrition plan and exercise to start with no medications.  ASSOCIATED CONDITIONS ADDRESSED TODAY  Action/Plan  Prediabetes Insulin  resistance Start Category 2  meal plan, limit simple carbohydrates Decreasing body weight by 10-15% can improve glucose levels She was previously on Metformin  . Will draw labs and decide on addition of this medication pending results. Start exercise with initial goal of 150 minutes of moderate to high intensity exercise/week.  -     Insulin , random -     Hemoglobin A1c  Vitamin D  deficiency Continue Vit D3 1000 units daily, if result is deficient will adjust dose -     VITAMIN D  25 Hydroxy (Vit-D Deficiency, Fractures)  OSA (obstructive sleep apnea)       Use CPAP 100% of the time       Decreasing body weight by 10-15% can improve AHI   Pure hypercholesterolemia Implement category 2 meal plan, limit saturated fats Loss of 10-15% body weight can improve lipid levels Focus on getting 150 minutes a week of moderate to high intensity exercise  -     Lipid panel  Class 2 obesity without serious comorbidity with body mass index (BMI) of 36.0 to 36.9 in adult, unspecified obesity type See above plan -     Comprehensive metabolic panel with GFR -     Insulin , random -      Hemoglobin A1c -     TSH -     Vitamin B12 -     VITAMIN D  25 Hydroxy (Vit-D Deficiency, Fractures)         Return in about 3 weeks (around 06/13/2024).Heather Hayes She was informed of the importance of frequent follow up visits to maximize her success with intensive lifestyle modifications for her multiple health conditions.   ATTESTASTION STATEMENTS:  Reviewed by clinician on day of visit: allergies, medications, problem list, medical history, surgical history, family history, social history, and previous encounter notes.     Kayla Deshaies ANP-C

## 2024-05-22 NOTE — Op Note (Addendum)
 [facility] Patient Name: Heather Hayes Procedure Date: 05/22/2024 2:37 PM MRN: [id number] Endoscopist: Lynnie Bring , MD, [id number] Age: 63 Referring MD:  Date of Birth: [birth year] Gender: Female Account #: [id number] Procedure:                Colonoscopy Indications:              High risk colon cancer surveillance: Personal                            history of colonic polyps Medicines:                Monitored Anesthesia Care Procedure:                Pre-Anesthesia Assessment:                           - Prior to the procedure, a History and Physical                            was performed, and patient medications and                            allergies were reviewed. The patient's tolerance of                            previous anesthesia was also reviewed. The risks                            and benefits of the procedure and the sedation                            options and risks were discussed with the patient.                            All questions were answered, and informed consent                            was obtained. Prior Anticoagulants: The patient has                            taken no anticoagulant or antiplatelet agents. ASA                            Grade Assessment: II - A patient with mild systemic                            disease. After reviewing the risks and benefits,                            the patient was deemed in satisfactory condition to                            undergo the procedure.  After obtaining informed consent, the colonoscope                            was passed under direct vision. Throughout the                            procedure, the patient's blood pressure, pulse, and                            oxygen saturations were monitored continuously. The                            Olympus CF-HQ190L ([id number]) Colonoscope was                            introduced through the anus and advanced  to the the                            cecum, identified by appendiceal orifice and                            ileocecal valve. The colonoscopy was performed                            without difficulty. The patient tolerated the                            procedure well. The quality of the bowel                            preparation was good. The ileocecal valve,                            appendiceal orifice, and rectum were photographed. Scope In: 2:51:50 PM Scope Out: 3:10:15 PM Scope Withdrawal Time: 0 hours 11 minutes 8 seconds  Total Procedure Duration: 0 hours 18 minutes 25 seconds  Findings:                 Two sessile polyps were found in the mid transverse                            colon. The polyps were 3 to 4 mm in size. These                            polyps were removed with a cold snare. Resection                            and retrieval were complete.                           A few medium-mouthed diverticula were found in the                            sigmoid colon.  Non-bleeding internal hemorrhoids were found during                            retroflexion. The hemorrhoids were small and Grade                            I (internal hemorrhoids that do not prolapse).                           The exam was otherwise without abnormality on                            direct and retroflexion views.                           Rectal exam did reveal perianal fistula. No                            drainage. Complications:            No immediate complications. Estimated Blood Loss:     Estimated blood loss: none. Impression:               - Two 3 to 4 mm polyps in the mid transverse colon,                            removed with a cold snare. Resected and retrieved.                           - Mild sigmoid diverticulosis.                           - Non-bleeding internal hemorrhoids.                           - The examination was otherwise normal  on direct                            and retroflexion views.                           - Perianal fistula. No evidence of Crohn's disease. Recommendation:           - Patient has a contact number available for                            emergencies. The signs and symptoms of potential                            delayed complications were discussed with the                            patient. Return to normal activities tomorrow.                            Written discharge instructions were provided to the  patient.                           - Resume previous diet.                           - Continue present medications.                           - Await pathology results.                           - Repeat colonoscopy for surveillance based on                            pathology results.                           - Patient has appointment with Dr. Teresa for                            perianal fistula.                           - The findings and recommendations were discussed                            with the patient's family. Lynnie Bring, MD 05/22/2024 3:14:10 PM This report has been signed electronically.

## 2024-05-22 NOTE — Progress Notes (Signed)
 Sedate, gd SR, tolerated procedure well, VSS, report to RN

## 2024-05-22 NOTE — Progress Notes (Signed)
 [facility] History and Physical   Primary Care Physician:  Arloa Elsie SAUNDERS, MD   Reason for Procedure:   H/O polyps  Plan:    colon     HPI: Heather Hayes is a 63 y.o. female    Past Medical History:  Diagnosis Date   Allergy    Anxiety    Arthritis    Back pain    Breast cancer (HCC)    Bursitis    hip right   Cancer (HCC)    right breast cancer 09-2018   Constipation    GERD (gastroesophageal reflux disease)    some heart burn from Arimidex    History of radiation therapy    completed 01-29-2019   Hyperlipidemia    Joint pain    Personal history of radiation therapy    Pneumonia    as a baby   Pre-diabetes    Sleep apnea    uses CPAP nightly   Swallowing difficulty     Past Surgical History:  Procedure Laterality Date   BREAST LUMPECTOMY Right 10/27/2018   BREAST LUMPECTOMY WITH RADIOACTIVE SEED AND SENTINEL LYMPH NODE BIOPSY Right 10/27/2018   Procedure: RIGHT BREAST LUMPECTOMY WITH RADIOACTIVE SEED AND SENTINEL LYMPH NODE BIOPSY;  Surgeon: Mikell Katz, MD;  Location: [facility];  Service: General;  Laterality: Right;   CERVICAL SPINE SURGERY  2009   COLONOSCOPY     ENDOMETRIAL ABLATION     INCISION AND DRAINAGE PERIRECTAL ABSCESS N/A 05/22/2023   Procedure: IRRIGATION AND DEBRIDEMENT PERIRECTAL ABSCESS AND DEBRIDEMENT OF INVOLVED TISSUES;  Surgeon: Polly Cordella LABOR, MD;  Location: [facility];  Service: General;  Laterality: N/A;   PLACEMENT OF SETON N/A 02/29/2024   Procedure: PLACEMENT, SETON;  Surgeon: Teresa Lonni HERO, MD;  Location: [facility];  Service: General;  Laterality: N/A;   POLYPECTOMY     RECTAL EXAM UNDER ANESTHESIA N/A 02/29/2024   Procedure: EXAM UNDER ANESTHESIA, RECTUM;  Surgeon: Teresa Lonni HERO, MD;  Location: [facility];  Service: General;  Laterality: N/A;   TUBAL LIGATION      Prior to Admission medications   Medication Sig Start Date End Date Taking?  Authorizing Provider  cholecalciferol (VITAMIN D3) 25 MCG (1000 UNIT) tablet Take 1,000 Units by mouth daily.   Yes [provider]  clonazePAM  (KLONOPIN ) 0.5 MG tablet Take 0.5 mg by mouth 2 (two) times daily as needed for anxiety.   Yes [provider]  loratadine (CLARITIN) 10 MG tablet Take 10 mg by mouth daily as needed for allergies.   Yes [provider]  NON FORMULARY Pt uses a cpap nightly   Yes [provider]  omeprazole (PRILOSEC) 40 MG capsule Take 40 mg by mouth daily. 03/13/24  Yes [provider]  escitalopram  (LEXAPRO ) 10 MG tablet Take 1 tablet (10 mg total) by mouth daily. Patient not taking: Reported on 05/09/2024 08/09/23 02/22/24  Rayburn, Elouise Phlegm, PA-C    Current Outpatient Medications  Medication Sig Dispense Refill   cholecalciferol (VITAMIN D3) 25 MCG (1000 UNIT) tablet Take 1,000 Units by mouth daily.     clonazePAM  (KLONOPIN ) 0.5 MG tablet Take 0.5 mg by mouth 2 (two) times daily as needed for anxiety.     loratadine (CLARITIN) 10 MG tablet Take 10 mg by mouth daily as needed for allergies.     NON FORMULARY Pt uses a cpap nightly     omeprazole (PRILOSEC) 40 MG capsule Take 40 mg by mouth daily.  escitalopram  (LEXAPRO ) 10 MG tablet Take 1 tablet (10 mg total) by mouth daily. (Patient not taking: Reported on 05/09/2024) 90 tablet 0   Current Facility-Administered Medications  Medication Dose Route Frequency Provider Last Rate Last Admin   0.9 %  sodium chloride  infusion  500 mL Intravenous Continuous Charlanne Groom, MD        Allergies as of 05/22/2024 - Review Complete 05/22/2024  Allergen Reaction Noted   Compazine [prochlorperazine edisylate] Other (See Comments) 10/11/2018    Family History  Problem Relation Age of Onset   Breast cancer Mother 6   Diabetes Mother    Hypertension Mother    Hyperlipidemia Mother    Obesity Mother    Diabetes Father    Hypertension Father    Hyperlipidemia Father     Ovarian cancer Maternal Grandmother    Colon cancer Neg Hx    Esophageal cancer Neg Hx    Rectal cancer Neg Hx    Stomach cancer Neg Hx     Social History   Socioeconomic History   Marital status: Married    Spouse name: Ikia Cincotta    Number of children: Not on file   Years of education: Not on file   Highest education level: Not on file  Occupational History   Occupation: Public relations account executive Adm assist  Tobacco Use   Smoking status: Never   Smokeless tobacco: Never  Vaping Use   Vaping status: Never Used  Substance and Sexual Activity   Alcohol use: Yes    Alcohol/week: 1.0 - 2.0 standard drink of alcohol    Types: 1 - 2 Glasses of wine per week    Comment: occais   Drug use: Never   Sexual activity: Yes    Birth control/protection: Post-menopausal, Surgical    Comment: ablation, BTL  Other Topics Concern   Not on file  Social History Narrative   Lives with husband and daughter   Social Drivers of Corporate investment banker Strain: Not on file  Food Insecurity: No Food Insecurity (05/22/2023)   Hunger Vital Sign    Worried About Running Out of Food in the Last Year: Never true    Ran Out of Food in the Last Year: Never true  Transportation Needs: No Transportation Needs (05/22/2023)   PRAPARE - Administrator, Civil Service (Medical): No    Lack of Transportation (Non-Medical): No  Physical Activity: Not on file  Stress: Not on file  Social Connections: Not on file  Intimate Partner Violence: Not At Risk (05/22/2023)   Humiliation, Afraid, Rape, and Kick questionnaire    Fear of Current or Ex-Partner: No    Emotionally Abused: No    Physically Abused: No    Sexually Abused: No    Review of Systems: Positive for none All other review of systems negative except as mentioned in the HPI.  Physical Exam: Vital signs in last 24 hours: @VSRANGES @   General:   Alert,  Well-developed, well-nourished, pleasant and cooperative in NAD Lungs:  Clear  throughout to auscultation.   Heart:  Regular rate and rhythm; no murmurs, clicks, rubs,  or gallops. Abdomen:  Soft, nontender and nondistended. Normal bowel sounds.   Neuro/Psych:  Alert and cooperative. Normal mood and affect. A and O x 3    No significant changes were identified.  The patient continues to be an appropriate candidate for the planned procedure and anesthesia.   Anselm Charlanne, MD. [facility] 05/22/2024 2:41 PM@

## 2024-05-22 NOTE — Patient Instructions (Addendum)
-  Handout on polyps, diverticulosis and hemorrhoids provided -Await pathology results  YOU HAD AN ENDOSCOPIC PROCEDURE TODAY AT THE [facility]:   Refer to the procedure report that was given to you for any specific questions about what was found during the examination.  If the procedure report does not answer your questions, please call your gastroenterologist to clarify.  If you requested that your care partner not be given the details of your procedure findings, then the procedure report has been included in a sealed envelope for you to review at your convenience later.  YOU SHOULD EXPECT: Some feelings of bloating in the abdomen. Passage of more gas than usual.  Walking can help get rid of the air that was put into your GI tract during the procedure and reduce the bloating. If you had a lower endoscopy (such as a colonoscopy or flexible sigmoidoscopy) you may notice spotting of blood in your stool or on the toilet paper. If you underwent a bowel prep for your procedure, you may not have a normal bowel movement for a few days.  Please Note:  You might notice some irritation and congestion in your nose or some drainage.  This is from the oxygen used during your procedure.  There is no need for concern and it should clear up in a day or so.  SYMPTOMS TO REPORT IMMEDIATELY:  Following lower endoscopy (colonoscopy or flexible sigmoidoscopy):  Excessive amounts of blood in the stool  Significant tenderness or worsening of abdominal pains  Swelling of the abdomen that is new, acute  Fever of 100F or higher  For urgent or emergent issues, a gastroenterologist can be reached at any hour by calling [phone]. Do not use MyChart messaging for urgent concerns.    DIET:  We do recommend a small meal at first, but then you may proceed to your regular diet.  Drink plenty of fluids but you should avoid alcoholic beverages for 24 hours.  ACTIVITY:  You should plan to take it easy for the  rest of today and you should NOT DRIVE or use heavy machinery until tomorrow (because of the sedation medicines used during the test).    FOLLOW UP: Our staff will call the number listed on your records the next business day following your procedure.  We will call around 7:15- 8:00 am to check on you and address any questions or concerns that you may have regarding the information given to you following your procedure. If we do not reach you, we will leave a message.     If any biopsies were taken you will be contacted by phone or by letter within the next 1-3 weeks.  Please call us  at [phone] if you have not heard about the biopsies in 3 weeks.    SIGNATURES/CONFIDENTIALITY: You and/or your care partner have signed paperwork which will be entered into your electronic medical record.  These signatures attest to the fact that that the information above on your After Visit Summary has been reviewed and is understood.  Full responsibility of the confidentiality of this discharge information lies with you and/or your care-partner.

## 2024-05-23 ENCOUNTER — Ambulatory Visit (INDEPENDENT_AMBULATORY_CARE_PROVIDER_SITE_OTHER): Admitting: Nurse Practitioner

## 2024-05-23 ENCOUNTER — Telehealth: Payer: Self-pay | Admitting: *Deleted

## 2024-05-23 ENCOUNTER — Encounter (INDEPENDENT_AMBULATORY_CARE_PROVIDER_SITE_OTHER): Payer: Self-pay | Admitting: Nurse Practitioner

## 2024-05-23 VITALS — BP 118/74 | HR 64 | Temp 98.7°F | Ht 62.5 in | Wt 205.0 lb

## 2024-05-23 DIAGNOSIS — E559 Vitamin D deficiency, unspecified: Secondary | ICD-10-CM

## 2024-05-23 DIAGNOSIS — G4733 Obstructive sleep apnea (adult) (pediatric): Secondary | ICD-10-CM | POA: Diagnosis not present

## 2024-05-23 DIAGNOSIS — R7303 Prediabetes: Secondary | ICD-10-CM

## 2024-05-23 DIAGNOSIS — E88819 Insulin resistance, unspecified: Secondary | ICD-10-CM | POA: Diagnosis not present

## 2024-05-23 DIAGNOSIS — E7800 Pure hypercholesterolemia, unspecified: Secondary | ICD-10-CM | POA: Diagnosis not present

## 2024-05-23 DIAGNOSIS — E66812 Obesity, class 2: Secondary | ICD-10-CM

## 2024-05-23 DIAGNOSIS — Z6836 Body mass index (BMI) 36.0-36.9, adult: Secondary | ICD-10-CM | POA: Diagnosis not present

## 2024-05-23 NOTE — Telephone Encounter (Signed)
  Follow up Call-     05/22/2024    1:58 PM  Call back number  Post procedure Call Back phone  # [phone]  Permission to leave phone message Yes    Left message to call back if any questions or concerns

## 2024-05-24 ENCOUNTER — Ambulatory Visit (INDEPENDENT_AMBULATORY_CARE_PROVIDER_SITE_OTHER): Payer: Self-pay | Admitting: Nurse Practitioner

## 2024-05-24 LAB — COMPREHENSIVE METABOLIC PANEL WITH GFR
ALT: 31 IU/L (ref 0–32)
AST: 29 IU/L (ref 0–40)
Albumin: 4.3 g/dL (ref 3.9–4.9)
Alkaline Phosphatase: 130 IU/L (ref 49–135)
BUN/Creatinine Ratio: 20 (ref 12–28)
BUN: 12 mg/dL (ref 8–27)
Bilirubin Total: 0.4 mg/dL (ref 0.0–1.2)
CO2: 22 mmol/L (ref 20–29)
Calcium: 9.5 mg/dL (ref 8.7–10.3)
Chloride: 102 mmol/L (ref 96–106)
Creatinine, Ser: 0.61 mg/dL (ref 0.57–1.00)
Globulin, Total: 2.8 g/dL (ref 1.5–4.5)
Glucose: 87 mg/dL (ref 70–99)
Potassium: 4.1 mmol/L (ref 3.5–5.2)
Sodium: 142 mmol/L (ref 134–144)
Total Protein: 7.1 g/dL (ref 6.0–8.5)
eGFR: 100 mL/min/1.73 (ref >59)

## 2024-05-24 LAB — LIPID PANEL
Chol/HDL Ratio: 3.3 ratio (ref 0.0–4.4)
Cholesterol, Total: 227 mg/dL — ABNORMAL HIGH (ref 100–199)
HDL: 68 mg/dL (ref >39)
LDL Chol Calc (NIH): 137 mg/dL — ABNORMAL HIGH (ref 0–99)
Triglycerides: 124 mg/dL (ref 0–149)
VLDL Cholesterol Cal: 22 mg/dL (ref 5–40)

## 2024-05-24 LAB — VITAMIN B12: Vitamin B-12: 1023 pg/mL (ref 232–1245)

## 2024-05-24 LAB — INSULIN, RANDOM: INSULIN: 13.4 u[IU]/mL (ref 2.6–24.9)

## 2024-05-24 LAB — TSH: TSH: 1.32 u[IU]/mL (ref 0.450–4.500)

## 2024-05-24 LAB — VITAMIN D 25 HYDROXY (VIT D DEFICIENCY, FRACTURES): Vit D, 25-Hydroxy: 42.1 ng/mL (ref 30.0–100.0)

## 2024-05-24 LAB — HEMOGLOBIN A1C
Est. average glucose Bld gHb Est-mCnc: 117 mg/dL
Hgb A1c MFr Bld: 5.7 % — ABNORMAL HIGH (ref 4.8–5.6)

## 2024-05-28 LAB — SURGICAL PATHOLOGY

## 2024-05-30 ENCOUNTER — Encounter (HOSPITAL_COMMUNITY)
Admission: RE | Disposition: A | Discharge status: Home / Self Care | Payer: Self-pay | Source: Home / Self Care | Attending: Surgery

## 2024-05-30 ENCOUNTER — Other Ambulatory Visit: Payer: Self-pay

## 2024-05-30 ENCOUNTER — Ambulatory Visit (HOSPITAL_COMMUNITY): Payer: Self-pay | Admitting: Anesthesiology

## 2024-05-30 ENCOUNTER — Ambulatory Visit (HOSPITAL_COMMUNITY): Payer: Self-pay | Admitting: Physician Assistant

## 2024-05-30 ENCOUNTER — Ambulatory Visit (HOSPITAL_COMMUNITY)
Admission: RE | Admit: 2024-05-30 | Discharge: 2024-05-30 | Disposition: A | Discharge status: Home / Self Care | Attending: Surgery | Admitting: Surgery

## 2024-05-30 ENCOUNTER — Encounter (HOSPITAL_COMMUNITY): Payer: Self-pay | Admitting: Surgery

## 2024-05-30 DIAGNOSIS — K60329 Anal fistula, complex, unspecified: Secondary | ICD-10-CM | POA: Diagnosis not present

## 2024-05-30 DIAGNOSIS — K60313 Anal fistula, simple, recurrent: Secondary | ICD-10-CM | POA: Diagnosis not present

## 2024-05-30 DIAGNOSIS — K219 Gastro-esophageal reflux disease without esophagitis: Secondary | ICD-10-CM | POA: Insufficient documentation

## 2024-05-30 DIAGNOSIS — G4730 Sleep apnea, unspecified: Secondary | ICD-10-CM | POA: Insufficient documentation

## 2024-05-30 DIAGNOSIS — Z853 Personal history of malignant neoplasm of breast: Secondary | ICD-10-CM | POA: Insufficient documentation

## 2024-05-30 DIAGNOSIS — F418 Other specified anxiety disorders: Secondary | ICD-10-CM | POA: Diagnosis not present

## 2024-05-30 SURGERY — LIGATION, INTERNAL FISTULA TRACT
Anesthesia: General

## 2024-05-30 MED ORDER — FENTANYL CITRATE (PF) 250 MCG/5ML IJ SOLN
INTRAMUSCULAR | Status: AC
  Filled 2024-05-30: qty 5

## 2024-05-30 MED ORDER — MIDAZOLAM HCL (PF) 2 MG/2ML IJ SOLN
INTRAMUSCULAR | Status: DC | PRN
  Administered 2024-05-30: 2 mg via INTRAVENOUS

## 2024-05-30 MED ORDER — PROPOFOL 500 MG/50ML IV EMUL
INTRAVENOUS | Status: DC | PRN
  Administered 2024-05-30: 150 mg via INTRAVENOUS

## 2024-05-30 MED ORDER — SUGAMMADEX SODIUM 200 MG/2ML IV SOLN
INTRAVENOUS | Status: DC | PRN
  Administered 2024-05-30: 200 mg via INTRAVENOUS

## 2024-05-30 MED ORDER — LACTATED RINGERS IV SOLN: INTRAVENOUS | Status: DC

## 2024-05-30 MED ORDER — MIDAZOLAM HCL (PF) 2 MG/2ML IJ SOLN: 0.5000 mg | Freq: Once | INTRAMUSCULAR | Status: DC | PRN

## 2024-05-30 MED ORDER — PHENYLEPHRINE 80 MCG/ML (10ML) SYRINGE FOR IV PUSH (FOR BLOOD PRESSURE SUPPORT)
PREFILLED_SYRINGE | INTRAVENOUS | Status: DC | PRN
  Administered 2024-05-30: 80 ug via INTRAVENOUS

## 2024-05-30 MED ORDER — LIDOCAINE HCL (PF) 2 % IJ SOLN
INTRAMUSCULAR | Status: AC
  Filled 2024-05-30: qty 5

## 2024-05-30 MED ORDER — CHLORHEXIDINE GLUCONATE CLOTH 2 % EX PADS: 6.0000 | MEDICATED_PAD | Freq: Once | CUTANEOUS | Status: DC

## 2024-05-30 MED ORDER — SUGAMMADEX SODIUM 200 MG/2ML IV SOLN
INTRAVENOUS | Status: AC
  Filled 2024-05-30: qty 2

## 2024-05-30 MED ORDER — OXYCODONE HCL 5 MG PO TABS
ORAL_TABLET | ORAL | Status: AC
  Filled 2024-05-30: qty 1

## 2024-05-30 MED ORDER — FENTANYL CITRATE (PF) 250 MCG/5ML IJ SOLN
INTRAMUSCULAR | Status: DC | PRN
  Administered 2024-05-30 (×2): 25 ug via INTRAVENOUS
  Administered 2024-05-30 (×2): 50 ug via INTRAVENOUS

## 2024-05-30 MED ORDER — ONDANSETRON HCL 4 MG/2ML IJ SOLN
INTRAMUSCULAR | Status: AC
  Filled 2024-05-30: qty 2

## 2024-05-30 MED ORDER — PROPOFOL 10 MG/ML IV BOLUS
INTRAVENOUS | Status: AC
  Filled 2024-05-30: qty 20

## 2024-05-30 MED ORDER — DIBUCAINE (PERIANAL) 1 % EX OINT
TOPICAL_OINTMENT | CUTANEOUS | Status: AC
  Filled 2024-05-30: qty 28

## 2024-05-30 MED ORDER — CHLORHEXIDINE GLUCONATE 0.12 % MT SOLN
15.0000 mL | Freq: Once | OROMUCOSAL | Status: AC
  Administered 2024-05-30: 15 mL via OROMUCOSAL

## 2024-05-30 MED ORDER — ACETAMINOPHEN 500 MG PO TABS
1000.0000 mg | ORAL_TABLET | Freq: Once | ORAL | Status: DC
  Filled 2024-05-30: qty 2

## 2024-05-30 MED ORDER — ORAL CARE MOUTH RINSE: 15.0000 mL | Freq: Once | OROMUCOSAL | Status: AC

## 2024-05-30 MED ORDER — METHYLENE BLUE (ANTIDOTE) 1 % IV SOLN
INTRAVENOUS | Status: AC
  Filled 2024-05-30: qty 10

## 2024-05-30 MED ORDER — MIDAZOLAM HCL 2 MG/2ML IJ SOLN
INTRAMUSCULAR | Status: AC
  Filled 2024-05-30: qty 2

## 2024-05-30 MED ORDER — TRAMADOL HCL 50 MG PO TABS: 50.0000 mg | ORAL_TABLET | Freq: Four times a day (QID) | ORAL | 0 refills | Status: AC | PRN

## 2024-05-30 MED ORDER — BUPIVACAINE-EPINEPHRINE (PF) 0.25% -1:200000 IJ SOLN
INTRAMUSCULAR | Status: DC | PRN
  Administered 2024-05-30: 30 mL via PERINEURAL

## 2024-05-30 MED ORDER — HYDROMORPHONE HCL 1 MG/ML IJ SOLN: 0.2500 mg | INTRAMUSCULAR | Status: DC | PRN

## 2024-05-30 MED ORDER — METHYLENE BLUE 20 MG/2ML IV SOSY
PREFILLED_SYRINGE | INTRAVENOUS | Status: DC | PRN
  Administered 2024-05-30: 1 mL via SUBMUCOSAL

## 2024-05-30 MED ORDER — BUPIVACAINE LIPOSOME 1.3 % IJ SUSP
INTRAMUSCULAR | Status: AC
  Filled 2024-05-30: qty 20

## 2024-05-30 MED ORDER — FLEET ENEMA RE ENEM: 1.0000 | ENEMA | Freq: Once | RECTAL | Status: DC

## 2024-05-30 MED ORDER — DEXAMETHASONE SOD PHOSPHATE PF 10 MG/ML IJ SOLN
INTRAMUSCULAR | Status: DC | PRN
  Administered 2024-05-30: 4 mg via INTRAVENOUS

## 2024-05-30 MED ORDER — 0.9 % SODIUM CHLORIDE (POUR BTL) OPTIME
TOPICAL | Status: DC | PRN
  Administered 2024-05-30: 1000 mL

## 2024-05-30 MED ORDER — BUPIVACAINE-EPINEPHRINE (PF) 0.25% -1:200000 IJ SOLN
INTRAMUSCULAR | Status: AC
  Filled 2024-05-30: qty 30

## 2024-05-30 MED ORDER — ROCURONIUM BROMIDE 10 MG/ML (PF) SYRINGE
PREFILLED_SYRINGE | INTRAVENOUS | Status: AC
  Filled 2024-05-30: qty 10

## 2024-05-30 MED ORDER — ACETAMINOPHEN 500 MG PO TABS
1000.0000 mg | ORAL_TABLET | ORAL | Status: AC
  Administered 2024-05-30: 1000 mg via ORAL

## 2024-05-30 MED ORDER — PROPOFOL 500 MG/50ML IV EMUL
INTRAVENOUS | Status: AC
  Filled 2024-05-30: qty 50

## 2024-05-30 MED ORDER — ONDANSETRON HCL 4 MG/2ML IJ SOLN
INTRAMUSCULAR | Status: DC | PRN
  Administered 2024-05-30: 4 mg via INTRAVENOUS

## 2024-05-30 MED ORDER — LIDOCAINE HCL (PF) 2 % IJ SOLN
INTRAMUSCULAR | Status: DC | PRN
Start: 2024-05-30 — End: 2024-05-30
  Administered 2024-05-30: 40 mg via INTRADERMAL

## 2024-05-30 MED ORDER — OXYCODONE HCL 5 MG/5ML PO SOLN: 5.0000 mg | Freq: Once | ORAL | Status: DC | PRN

## 2024-05-30 MED ORDER — SODIUM CHLORIDE 0.9 % IV SOLN
2.0000 g | INTRAVENOUS | Status: AC
Start: 2024-05-30 — End: 2024-05-30
  Administered 2024-05-30: 2 g via INTRAVENOUS
  Filled 2024-05-30: qty 2

## 2024-05-30 MED ORDER — MEPERIDINE HCL 100 MG/ML IJ SOLN: 6.2500 mg | INTRAMUSCULAR | Status: DC | PRN

## 2024-05-30 MED ORDER — OXYCODONE HCL 5 MG PO TABS: 5.0000 mg | ORAL_TABLET | Freq: Once | ORAL | Status: DC | PRN

## 2024-05-30 SURGICAL SUPPLY — 38 items
BAG COUNTER SPONGE SURGICOUNT (BAG) IMPLANT
BENZOIN TINCTURE PRP APPL 2/3 (GAUZE/BANDAGES/DRESSINGS) IMPLANT
BLADE SURG 15 STRL LF DISP TIS (BLADE) IMPLANT
BRIEF MESH DISP LRG (UNDERPADS AND DIAPERS) ×1 IMPLANT
COVER SURGICAL LIGHT HANDLE (MISCELLANEOUS) ×1 IMPLANT
DISSECTOR SURG LIGASURE 21 (MISCELLANEOUS) IMPLANT
DRAPE LAPAROTOMY T 102X78X121 (DRAPES) ×1 IMPLANT
ELECT NDL BLADE 2-5/6 (NEEDLE) IMPLANT
ELECT NDL TIP 2.8 STRL (NEEDLE) ×1 IMPLANT
ELECT NEEDLE BLADE 2-5/6 (NEEDLE) ×1 IMPLANT
ELECT NEEDLE TIP 2.8 STRL (NEEDLE) ×1 IMPLANT
ELECT PENCIL ROCKER SW 15FT (MISCELLANEOUS) IMPLANT
ELECT REM PT RETURN 15FT ADLT (MISCELLANEOUS) ×1 IMPLANT
GAUZE 4X4 16PLY ~~LOC~~+RFID DBL (SPONGE) ×1 IMPLANT
GAUZE PAD ABD 8X10 STRL (GAUZE/BANDAGES/DRESSINGS) IMPLANT
GAUZE SPONGE 4X4 12PLY STRL (GAUZE/BANDAGES/DRESSINGS) IMPLANT
GLOVE BIO SURGEON STRL SZ7.5 (GLOVE) ×1 IMPLANT
GLOVE INDICATOR 8.0 STRL GRN (GLOVE) ×1 IMPLANT
GOWN STRL REUS W/ TWL XL LVL3 (GOWN DISPOSABLE) ×1 IMPLANT
KIT BASIN OR (CUSTOM PROCEDURE TRAY) ×1 IMPLANT
KIT TURNOVER KIT A (KITS) ×1 IMPLANT
LOOP VESSEL MAXI BLUE (MISCELLANEOUS) IMPLANT
NDL HYPO 22X1.5 SAFETY MO (MISCELLANEOUS) ×1 IMPLANT
NEEDLE HYPO 22X1.5 SAFETY MO (MISCELLANEOUS) ×1 IMPLANT
PACK BASIC VI WITH GOWN DISP (CUSTOM PROCEDURE TRAY) ×1 IMPLANT
PAK SCROTO (SET/KITS/TRAYS/PACK) IMPLANT
RETRACTOR RING URO 16.6X16.6 (MISCELLANEOUS) IMPLANT
RETRACTOR STAY HOOK 5MM (MISCELLANEOUS) IMPLANT
SHEARS HARMONIC 9CM CVD (BLADE) IMPLANT
SPIKE FLUID TRANSFER (MISCELLANEOUS) ×1 IMPLANT
SURGILUBE 2OZ TUBE FLIPTOP (MISCELLANEOUS) ×1 IMPLANT
SUT CHROMIC 2 0 SH (SUTURE) IMPLANT
SUT CHROMIC 3 0 SH 27 (SUTURE) IMPLANT
SUT VIC AB 2-0 SH 27X BRD (SUTURE) IMPLANT
SUT VIC AB 2-0 UR6 27 (SUTURE) ×2 IMPLANT
SUT VIC AB 3-0 SH 27XBRD (SUTURE) IMPLANT
SYR 20ML LL LF (SYRINGE) ×1 IMPLANT
TOWEL OR 17X26 10 PK STRL BLUE (TOWEL DISPOSABLE) ×1 IMPLANT

## 2024-05-30 NOTE — Transfer of Care (Signed)
 Immediate Anesthesia Transfer of Care Note  Patient: Heather Hayes  Procedure(s) Performed: LIGATION, INTERNAL FISTULA TRACT EXAM UNDER ANESTHESIA, RECTUM  Patient Location: PACU  Anesthesia Type:General  Level of Consciousness: oriented, drowsy, and patient cooperative  Airway & Oxygen Therapy: Patient Spontanous Breathing and Patient connected to face mask oxygen  Post-op Assessment: Report given to RN and Post -op Vital signs reviewed and stable  Post vital signs: Reviewed and stable  Last Vitals:  Vitals Value Taken Time  BP 117/50 05/30/24 09:59  Temp 36.6 C 05/30/24 09:59  Pulse 71 05/30/24 10:00  Resp 15 05/30/24 10:00  SpO2 100 % 05/30/24 10:00  Vitals shown include unfiled device data.  Last Pain:  Vitals:   05/30/24 0731  TempSrc:   PainSc: 0-No pain         Complications: No notable events documented.

## 2024-05-30 NOTE — Anesthesia Preprocedure Evaluation (Addendum)
 Anesthesia Evaluation  Patient identified by MRN, date of birth, ID band Patient awake    Reviewed: Allergy & Precautions, NPO status , Patient's Chart, lab work & pertinent test results  History of Anesthesia Complications Negative for: history of anesthetic complications  Airway Mallampati: IV  TM Distance: >3 FB Neck ROM: Full    Dental  (+) Dental Advisory Given   Pulmonary sleep apnea and Continuous Positive Airway Pressure Ventilation    breath sounds clear to auscultation       Cardiovascular negative cardio ROS  Rhythm:Regular Rate:Normal     Neuro/Psych   Anxiety Depression    negative neurological ROS     GI/Hepatic Neg liver ROS,GERD  Medicated and Controlled,,  Endo/Other  BMI 37  Renal/GU negative Renal ROS     Musculoskeletal  (+) Arthritis ,    Abdominal   Peds  Hematology negative hematology ROS (+)   Anesthesia Other Findings Breast cancer  Reproductive/Obstetrics                              Anesthesia Physical Anesthesia Plan  ASA: 3  Anesthesia Plan: General   Post-op Pain Management: Tylenol  PO (pre-op)*   Induction: Intravenous  PONV Risk Score and Plan: 3 and Ondansetron , Dexamethasone  and Droperidol  Airway Management Planned: Oral ETT and Video Laryngoscope Planned  Additional Equipment: None  Intra-op Plan:   Post-operative Plan: Extubation in OR  Informed Consent: I have reviewed the patients History and Physical, chart, labs and discussed the procedure including the risks, benefits and alternatives for the proposed anesthesia with the patient or authorized representative who has indicated his/her understanding and acceptance.     Dental advisory given  Plan Discussed with: CRNA and Surgeon  Anesthesia Plan Comments:          Anesthesia Quick Evaluation

## 2024-05-30 NOTE — Discharge Instructions (Addendum)

## 2024-05-30 NOTE — H&P (Signed)
 CC: Here today for surgery  HPI: Heather Hayes is an 63 y.o. female with history of GERD, breast cancer (2020), whom is seen in the office today as a referral by Dr. Polly for evaluation of possible anal fistula.   She was taken to the operating room by Dr. Polly 05/22/2023 for drainage of perirectal abscess and EUA.  CT Pelvis 05/2023 -left medial buttock inflammatory changes into the ischial anal fossa with multiple gas locules and stranding  She recovered well. While showering, she felt a tunnel extending towards the anal canal and had called the office. Subsequently, was given a course of Augmentin  and a CT.  CT Pelvis 01/19/24 1. Linear soft tissue density in the subcutaneous tissues extending from the left anterolateral aspect of the anal verge to the skin surface in the medial left gluteal region, most consistent with perianal fistula. No fluid collection or abscess.  She reports that she has had a few episodes over the last couple of months where she would develop a blood blister that would pop and spontaneously drained some fluid. She has had some persistent drainage since that time. She has a wound on the left side near her I&D site that she states just never seems to close.  Denies any issues with incontinence to gas, liquid, or solid stool.  Last colonoscopy 05/2019 - Dr. Charlanne: -Colonic polyps status post polypectomy-path showed tubular adenoma, hyperplastic polyp - Mild sigmoid diverticulosis - Small internal and external hemorrhoids - Exam otherwise normal   OR 02/29/24 Surgical treatment of transsphincteric anal fistula with placement of draining seton Anorectal exam under anesthesia  OR FINDINGS: Left anterior transsphincteric anal fistula-internal opening at the level of the dentate in the anterior midline; external opening 4 to 5 cm from the anal os in the left anterior position. Scarring in the anterior midline with sentinel tag and hypertrophic anal  papilla consistent with prior chronic anal fissure. There is no active anal fissure at this time however.   INTERVAL HX She denies any changes in health or health history since we met in the office. No new medications/allergies. She states she is ready for surgery today.   PMH: GERD, breast cancer (2020)  PSH: I&D perirectal abscess 05/2023  FHx: Denies any known family history of colorectal, breast, endometrial or ovarian cancer  Social Hx: Denies use of tobacco/EtOH/illicit drug. She works for a [employer]. She is here today by herself.    Past Medical History:  Diagnosis Date   Allergy    Anxiety    Arthritis    Back pain    Breast cancer (HCC)    Bursitis    hip right   Cancer (HCC)    right breast cancer 09-2018   Constipation    GERD (gastroesophageal reflux disease)    some heart burn from Arimidex    History of radiation therapy    completed 01-29-2019   Hyperlipidemia    Joint pain    Personal history of radiation therapy    Pneumonia    as a baby   Pre-diabetes    Sleep apnea    uses CPAP nightly   Swallowing difficulty     Past Surgical History:  Procedure Laterality Date   BREAST LUMPECTOMY Right 10/27/2018   BREAST LUMPECTOMY WITH RADIOACTIVE SEED AND SENTINEL LYMPH NODE BIOPSY Right 10/27/2018   Procedure: RIGHT BREAST LUMPECTOMY WITH RADIOACTIVE SEED AND SENTINEL LYMPH NODE BIOPSY;  Surgeon: Mikell Katz, MD;  Location: [facility];  Service: General;  Laterality: Right;   CERVICAL SPINE SURGERY  2009   COLONOSCOPY     ENDOMETRIAL ABLATION     INCISION AND DRAINAGE PERIRECTAL ABSCESS N/A 05/22/2023   Procedure: IRRIGATION AND DEBRIDEMENT PERIRECTAL ABSCESS AND DEBRIDEMENT OF INVOLVED TISSUES;  Surgeon: Polly Cordella LABOR, MD;  Location: [facility];  Service: General;  Laterality: N/A;   PLACEMENT OF SETON N/A 02/29/2024   Procedure: PLACEMENT, SETON;  Surgeon: Teresa Lonni HERO, MD;   Location: [facility];  Service: General;  Laterality: N/A;   POLYPECTOMY     RECTAL EXAM UNDER ANESTHESIA N/A 02/29/2024   Procedure: EXAM UNDER ANESTHESIA, RECTUM;  Surgeon: Teresa Lonni HERO, MD;  Location:  SURGERY CENTER;  Service: General;  Laterality: N/A;   TUBAL LIGATION      Family History  Problem Relation Age of Onset   Breast cancer Mother 63   Diabetes Mother    Hypertension Mother    Hyperlipidemia Mother    Obesity Mother    Diabetes Father    Hypertension Father    Hyperlipidemia Father    Ovarian cancer Maternal Grandmother    Colon cancer Neg Hx    Esophageal cancer Neg Hx    Rectal cancer Neg Hx    Stomach cancer Neg Hx     Social:  reports that she has never smoked. She has never used smokeless tobacco. She reports current alcohol use of about 1.0 - 2.0 standard drink of alcohol per week. She reports that she does not use drugs.  Allergies:  Allergies  Allergen Reactions   Compazine [Prochlorperazine Edisylate] Other (See Comments)    Muscle contractures     Medications: I have reviewed the patient's current medications.  No results found for this or any previous visit (from the past 48 hours).  No results found.   PE Blood pressure 118/71, pulse 68, temperature 97.7 F (36.5 C), temperature source Oral, resp. rate 16, height 5' 2.5 (1.588 Heather), weight 93 kg, SpO2 95%. Constitutional: NAD; conversant Eyes: Moist conjunctiva; no lid lag; anicteric Lungs: Normal respiratory effort CV: RRR Psychiatric: Appropriate affect  No results found for this or any previous visit (from the past 48 hours).  No results found.  A/P: Heather Hayes is an 63 y.o. female with hx of GERD, breast cancer (2020), perirectal abscess, transsphincteric anal fistula - OR 02/29/24 - placement of draining seton  - We discussed the anatomy and physiology of the anorectal region and pathophysiology of anal abscess and fistula with associated  illustrations using the American Society of Colon and Rectal Surgery trifold handout on anal abscess and fistula - We have reviewed options going forward including further observation with definitive indwelling seton management vs surgery -surgical treatment of transsphincteric anal fistula with ligation of intersphincteric fistula tract (LIFT); anorectal exam under anesthesia - The planned procedure, material risks (including, but not limited to, pain, bleeding, infection, scarring, need for blood transfusion, damage to anal sphincter, incontinence of gas and/or stool, need for additional procedures, anal stenosis, rare cases of pelvic sepsis which in severe cases may require things like a colostomy, recurrence, blood clot, pulmonary embolus, pneumonia, heart attack, stroke, death) benefits and alternatives to surgery were discussed at length. I noted a good probability that the procedure would help improve her symptoms. We spent time discussing what a draining seton may involve if this were necessary. We discussed that there would be multiple procedures required if this were the case with the seton being  in place generally for 3 months followed by attempts at definitive repair. We discussed the success rates of the subsequent procedures to be ~80% with an approximate 20% recurrence rate. - The patient's questions were answered to her satisfaction, she voiced understanding and elected to proceed with surgery. Additionally, we discussed typical postoperative expectations and the recovery process.  Lonni Pizza, MD [facility], [facility]

## 2024-05-30 NOTE — Anesthesia Procedure Notes (Signed)
 Procedure Name: Intubation Date/Time: 05/30/2024 8:51 AM  Performed by: Augusta Daved SAILOR, CRNAPre-anesthesia Checklist: Patient identified, Emergency Drugs available, Suction available and Patient being monitored Patient Re-evaluated:Patient Re-evaluated prior to induction Oxygen Delivery Method: Circle System Utilized Preoxygenation: Pre-oxygenation with 100% oxygen Induction Type: IV induction Ventilation: Mask ventilation without difficulty and Oral airway inserted - appropriate to patient size Laryngoscope Size: Glidescope and 3 Grade View: Grade II Tube type: Oral Tube size: 7.0 mm Number of attempts: 1 Airway Equipment and Method: Stylet and Oral airway Placement Confirmation: ETT inserted through vocal cords under direct vision, positive ETCO2 and breath sounds checked- equal and bilateral Secured at: 21 (at the lips) cm Tube secured with: Tape Dental Injury: Teeth and Oropharynx as per pre-operative assessment

## 2024-05-30 NOTE — Op Note (Signed)
 05/30/2024  9:47 AM  PATIENT:  Heather Hayes  63 y.o. female  Patient Care Team: Arloa Elsie JONELLE, MD as PCP - General (Family Medicine) Tyree Nanetta SAILOR, RN as Oncology Nurse Navigator Lanny Callander, MD as Consulting Physician (Hematology) Shannon Agent, MD as Consulting Physician (Radiation Oncology) Burton, Lacie K, NP as Nurse Practitioner (Nurse Practitioner)  PRE-OPERATIVE DIAGNOSIS:  Transsphincteric anal fistula  POST-OPERATIVE DIAGNOSIS:  Same  PROCEDURE:   Surgical treatment of transsphincteric anal fistula with LIFT (Ligation of intersphincteric fistulous tract) Anorectal exam under anesthesia  SURGEON:  Surgeon(s): Teresa Lonni HERO, MD  ASSISTANT: OR Staff  ANESTHESIA:   local and general  SPECIMEN:  N/A  DISPOSITION OF SPECIMEN:  N/A  COUNTS:  Sponge, needle, and instrument counts were reported correct x2 at conclusion.  EBL: 5 mL  Drains: None  PLAN OF CARE: Discharge to home after PACU  PATIENT DISPOSITION:  PACU - hemodynamically stable.  OR FINDINGS: Left anterior transsphincteric fistula - IO ~at dentate line; EO left anterior anal margin. Well defined fibrotic single tract without abscess. LIFT carried out uneventfully with excellent result.  DESCRIPTION: The patient was seen in the pre-op holding area. The risks, benefits, complications, treatment options, and expected outcomes were previously discussed with the patient. The patient agreed with the proposed plan and has signed the informed consent form. The patient was brought to the operating room by the surgical team, identified as Heather Hayes, and the procedure verified. SCD's were applied. General anesthesia was induced without difficulty. The patient was then rolled onto the OR table in the prone jackknife position.  Pressure points were evaluated and padded.  Benzoin was applied to the buttocks and they were gently taped apart.  She was then prepped and draped in usual sterile fashion. A  time out was completed and the above information confirmed and need for preoperative antibiotics.  A perianal block was then created using a dilute mixture of 0.25% Marcaine  with epinephrine .  After ascertaining an appropriate level of anesthesia had been achieved, a well lubricated digital rectal exam was performed. This demonstrated no palpable abnormalities aside from some scarring anterior midline.  A Hill-Ferguson anoscope was into the anal canal and circumferential inspection demonstrated healthy appearing anoderm with scarring and hypertrophic anal papilla in the anterior midline region. Seton had fractured prior to surgery today and was therefore absent. Externally, external opening is in the left anterior anal margin ~3-4 cm from anal opening. 1cc of methylene blue  is injected into the tract. IO readily identified in anterior midline. There is a nice fibrotic cord extending from EO to IO. No  erythema, purulence, nor fluctuance. Tract is easily controlled with flexible fistula probe.  Tract is palpated and found to involve both internal and external sphincter muscle and appears consistent with transsphincteric anal fistula, somewhat low. This appears to be amenable to ligation of intersphincteric fistulous tract (LIFT).   Preparations are made.  Intersphincteric groove was palpated. A curvilinear incision is made over the intersphincteric plane and developed with electrocautery. No sphincter muscle is divided. Lonestar retractor is placed. Fistula is readily identified with aid of previously instilled methylene blue . This is able to be circumferentially dissected with a fine right angle. The probe is then removed and the tract doubly clamped and then divided between clamps. Each respective side is doubly suture ligated with 2-0 vicryl suture with great result. The external opening is probed carefully and found to have been successfully excluded. EO is carefully fulgurated and partially excised  to  aid in healing due to hypertrophic granulation tissue. IO is carefully probed with crypt hook and found to have been excluded as well. Wound is irrigated, hemostasis verified. Lonestar retractor is removed. The wound is closed in layers using 3-0 vicryl suture to re-approximate the intersphincteric plane, followed by 3-0 chromic to closed the skin.  All sponge, needle and instrument counts were reported correct. Additional anesthetic is infiltrated at the LIFT incision. A dressing consisting of 4x4s, ABD, mesh underwear is ultimately placed. She was rolled back onto a stretcher, awakened from anesthesia and transferred to recovery in satisfactory condition.  DISPOSITION: PACU in satisfactory condition.

## 2024-05-30 NOTE — Anesthesia Postprocedure Evaluation (Signed)
 Anesthesia Post Note  Patient: Heather Hayes  Procedure(s) Performed: LIGATION, INTERNAL FISTULA TRACT EXAM UNDER ANESTHESIA, RECTUM     Patient location during evaluation: PACU Anesthesia Type: General Level of consciousness: awake and alert, oriented and patient cooperative Pain management: pain level controlled Vital Signs Assessment: post-procedure vital signs reviewed and stable Respiratory status: spontaneous breathing, nonlabored ventilation and respiratory function stable Cardiovascular status: blood pressure returned to baseline and stable Postop Assessment: no apparent nausea or vomiting and able to ambulate Anesthetic complications: no   No notable events documented.  Last Vitals:  Vitals:   05/30/24 1045 05/30/24 1100  BP: 119/70 126/82  Pulse: 62 71  Resp: 13 14  Temp:  (!) 36.4 C  SpO2: 99% 100%    Last Pain:  Vitals:   05/30/24 1100  TempSrc: Oral  PainSc:                  Bradlee Bridgers,E. Abel Ra

## 2024-05-31 ENCOUNTER — Encounter (HOSPITAL_COMMUNITY): Payer: Self-pay | Admitting: Surgery

## 2024-06-03 ENCOUNTER — Ambulatory Visit: Payer: Self-pay | Admitting: Gastroenterology

## 2024-06-14 ENCOUNTER — Encounter (INDEPENDENT_AMBULATORY_CARE_PROVIDER_SITE_OTHER): Payer: Self-pay | Admitting: Family Medicine

## 2024-06-14 ENCOUNTER — Telehealth (INDEPENDENT_AMBULATORY_CARE_PROVIDER_SITE_OTHER): Payer: Self-pay

## 2024-06-14 ENCOUNTER — Other Ambulatory Visit (INDEPENDENT_AMBULATORY_CARE_PROVIDER_SITE_OTHER): Payer: Self-pay | Admitting: Family Medicine

## 2024-06-14 ENCOUNTER — Ambulatory Visit (INDEPENDENT_AMBULATORY_CARE_PROVIDER_SITE_OTHER): Payer: Self-pay | Admitting: Family Medicine

## 2024-06-14 VITALS — BP 133/83 | HR 67 | Temp 97.9°F | Ht 62.5 in | Wt 205.0 lb

## 2024-06-14 DIAGNOSIS — Z6836 Body mass index (BMI) 36.0-36.9, adult: Secondary | ICD-10-CM | POA: Diagnosis not present

## 2024-06-14 DIAGNOSIS — E66812 Obesity, class 2: Secondary | ICD-10-CM | POA: Diagnosis not present

## 2024-06-14 DIAGNOSIS — G4733 Obstructive sleep apnea (adult) (pediatric): Secondary | ICD-10-CM

## 2024-06-14 DIAGNOSIS — E7800 Pure hypercholesterolemia, unspecified: Secondary | ICD-10-CM | POA: Diagnosis not present

## 2024-06-14 DIAGNOSIS — M47817 Spondylosis without myelopathy or radiculopathy, lumbosacral region: Secondary | ICD-10-CM | POA: Diagnosis not present

## 2024-06-14 MED ORDER — ZEPBOUND 2.5 MG/0.5ML ~~LOC~~ SOAJ: 2.5000 mg | SUBCUTANEOUS | 0 refills | Status: DC

## 2024-06-14 NOTE — Progress Notes (Signed)
 SUBJECTIVE:  Chief Complaint: Obesity  Interim History: Patient is a recent restart.  Last year has been difficult- just had a surgery 2 weeks ago.  She started back at work just this week.  The last two weeks she has been eating food that has been brought to her.  She is anticipating continued post op improvement over the next few weeks.    Heather Hayes is here to discuss her progress with her obesity treatment plan. She is on the Category 2 Plan and states she is following her eating plan approximately 50 % of the time. She states she is not exercising.   OBJECTIVE: Visit Diagnoses: Problem List Items Addressed This Visit       Respiratory   OSA (obstructive sleep apnea)   HST 06/14/22 >> AHI 53.2/hr. Patient is 100% compliant with CPAP > 4 hours last 30 days. She reports benefit in daytime fatigue with PAP use. Epworth 2 (6). Current CPAP pressure auto settings 5-15cm h2O; Residual AHI 3.2/hr. No issues with mask fit or pressure settings. DME is [facility].   We discussed possible risks, side effects and benefits of additional therapy that includes Zepbound.  She would like to pursue this medication at this time.  Starting dose sent in- will complete prior authorization      Relevant Medications   tirzepatide (ZEPBOUND) 2.5 MG/0.5ML Pen   Other Visit Diagnoses       Pure hypercholesterolemia    -  Primary     Class 2 obesity without serious comorbidity with body mass index (BMI) of 36.0 to 36.9 in adult, unspecified obesity type       Relevant Medications   tirzepatide (ZEPBOUND) 2.5 MG/0.5ML Pen     BMI 36.0-36.9,adult           Vitals Temp: 97.9 F (36.6 C) BP: 133/83 Pulse Rate: 67 SpO2: 98 %   Anthropometric Measurements Height: 5' 2.5 (1.588 m) Weight: 205 lb (93 kg) BMI (Calculated): 36.87 Weight at Last Visit: 205 lb Weight Lost Since Last Visit: 0 Starting Weight: 191 lb Total Weight Loss (lbs): 0 lb (0 kg)   Body Composition  Body Fat %: 48.2 % Fat Mass  (lbs): 99 lbs Muscle Mass (lbs): 101 lbs Total Body Water (lbs): 76.8 lbs Visceral Fat Rating : 15   Other Clinical Data Fasting: no Labs: no Today's Visit #: 56 Starting Date: 05/15/19 Comments: Cat 2     ASSESSMENT AND PLAN: Assessment & Plan Pure hypercholesterolemia The 10-year ASCVD risk score (Arnett DK, et al., 2019) is: 4.7%   Values used to calculate the score:     Age: 64 years     Clincally relevant sex: Female     Is Non-Hispanic African American: No     Diabetic: No     Tobacco smoker: No     Systolic Blood Pressure: 133 mmHg     Is BP treated: No     HDL Cholesterol: 68 mg/dL     Total Cholesterol: 227 mg/dL Patient's [id number] ASCVD risk as above.  Discussed possible usage of medication to further lower cholesterol risk.  Will hold off on prescribed at this time and repeat cholesterol panel in 4 months to review risk stratify and discussed with patient any further intervention. OSA (obstructive sleep apnea) HST 06/14/22 >> AHI 53.2/hr. Patient is 100% compliant with CPAP > 4 hours last 30 days. She reports benefit in daytime fatigue with PAP use. Epworth 2 (6). Current CPAP pressure auto settings 5-15cm h2O; Residual  AHI 3.2/hr. No issues with mask fit or pressure settings. DME is [facility].   We discussed possible risks, side effects and benefits of additional therapy that includes Zepbound.  She would like to pursue this medication at this time.  Starting dose sent in- will complete prior authorization Class 2 obesity without serious comorbidity with body mass index (BMI) of 36.0 to 36.9 in adult, unspecified obesity type  BMI 36.0-36.9,adult    Diet: Heather Hayes is currently in the action stage of change. As such, her goal is to continue with weight loss efforts and has agreed to keeping a food journal and adhering to recommended goals of 1150-1250 calories and 85 or more grams of protein daily.  Patient to start food log or journaling meal plan.  The initial goal will  be to habitually log or journal for at least 4 days a week.  The expectation it that patient may not initially meet calorie or protein goals as the nutritional understanding of food intake is begun.  We discussed the 10:1 ratio when reading a food label.  Patient agrees to keep a food log either electronically or on paper and bring to the next appointment to be able to dissect and discuss it with provider.     Exercise:  All adults should avoid inactivity. Some activity is better than none, and adults who participate in any amount of physical activity, gain some health benefits.  Behavior Modification:  We discussed the following Behavioral Modification Strategies today: increasing lean protein intake, decreasing simple carbohydrates, meal planning and cooking strategies, and holiday eating strategies.   Return in about 3 weeks (around 07/05/2024).   She was informed of the importance of frequent follow up visits to maximize her success with intensive lifestyle modifications for her multiple health conditions.  Attestation Statements:   Reviewed by clinician on day of visit: allergies, medications, problem list, medical history, surgical history, family history, social history, and previous encounter notes.    Heather Cho, MD

## 2024-06-14 NOTE — Telephone Encounter (Signed)
 PA for Zepbound  2.5 MG has been submitted, awaiting PA questions.

## 2024-06-14 NOTE — Assessment & Plan Note (Addendum)
 HST 06/14/22 >> AHI 53.2/hr. Patient is 100% compliant with CPAP > 4 hours last 30 days. She reports benefit in daytime fatigue with PAP use. Epworth 2 (6). Current CPAP pressure auto settings 5-15cm h2O; Residual AHI 3.2/hr. No issues with mask fit or pressure settings. DME is [facility].   We discussed possible risks, side effects and benefits of additional therapy that includes Zepbound.  She would like to pursue this medication at this time.  Starting dose sent in- will complete prior authorization

## 2024-06-15 ENCOUNTER — Encounter (INDEPENDENT_AMBULATORY_CARE_PROVIDER_SITE_OTHER): Payer: Self-pay | Admitting: Family Medicine

## 2024-06-19 NOTE — Telephone Encounter (Signed)
 Zepbound 2.5 mg  *Drug Not Covered/Plan Exclusion - Your request for coverage was denied because your prescription benefit plan does not cover the requested medication.

## 2024-06-19 NOTE — Telephone Encounter (Signed)
 Please advise

## 2024-06-21 ENCOUNTER — Other Ambulatory Visit (INDEPENDENT_AMBULATORY_CARE_PROVIDER_SITE_OTHER): Payer: Self-pay | Admitting: Family Medicine

## 2024-06-21 DIAGNOSIS — R7303 Prediabetes: Secondary | ICD-10-CM

## 2024-06-21 MED ORDER — METFORMIN HCL 500 MG PO TABS: 500.0000 mg | ORAL_TABLET | Freq: Every day | ORAL | 0 refills | Status: DC

## 2024-07-02 DIAGNOSIS — G4733 Obstructive sleep apnea (adult) (pediatric): Secondary | ICD-10-CM | POA: Diagnosis not present

## 2024-07-09 ENCOUNTER — Encounter (INDEPENDENT_AMBULATORY_CARE_PROVIDER_SITE_OTHER): Payer: Self-pay | Admitting: Family Medicine

## 2024-07-09 ENCOUNTER — Ambulatory Visit (INDEPENDENT_AMBULATORY_CARE_PROVIDER_SITE_OTHER): Admitting: Family Medicine

## 2024-07-09 VITALS — BP 114/72 | HR 62 | Temp 97.7°F | Ht 62.5 in | Wt 203.0 lb

## 2024-07-09 DIAGNOSIS — Z6836 Body mass index (BMI) 36.0-36.9, adult: Secondary | ICD-10-CM

## 2024-07-09 DIAGNOSIS — G4733 Obstructive sleep apnea (adult) (pediatric): Secondary | ICD-10-CM | POA: Diagnosis not present

## 2024-07-09 DIAGNOSIS — R7303 Prediabetes: Secondary | ICD-10-CM | POA: Diagnosis not present

## 2024-07-09 DIAGNOSIS — E66812 Obesity, class 2: Secondary | ICD-10-CM

## 2024-07-09 DIAGNOSIS — M47817 Spondylosis without myelopathy or radiculopathy, lumbosacral region: Secondary | ICD-10-CM | POA: Diagnosis not present

## 2024-07-09 NOTE — Assessment & Plan Note (Signed)
 Started on metformin  since last appointment.  She feels this is helping with the frequency of her bowl movements since starting.  No GI side effects mentioned. Continue metformin  and no refill needed.

## 2024-07-09 NOTE — Assessment & Plan Note (Signed)
 Insurance does not cover Zepbound  currently for treatment of OSA.  May want to revisit coverage in January to see if insurance covers it.

## 2024-07-09 NOTE — Progress Notes (Signed)
 SUBJECTIVE:  Chief Complaint: Obesity  Interim History: patient used free version of [employer] and has been working on focusing on protein.  She has had Thanksgiving and some Christmas festivities since our last appointment.  Her insurance I did not approve Zepbound . She was doing well with her protein intake initially.  She was around [phone] calories and protein intake has been variable. Some days she has been able to get a significant amount of protein in for the amount of calories she was taking in and other days she got her calories in but her protein was low.   Heather Hayes is here to discuss her progress with her obesity treatment plan. She is on the keeping a food journal and adhering to recommended goals of 1150-1250 calories and 85 grams of protein and states she is following her eating plan approximately 70 % of the time. She states she is walking some.  OBJECTIVE: Visit Diagnoses: Problem List Items Addressed This Visit       Respiratory   OSA (obstructive sleep apnea)     Other   Prediabetes - Primary   Other Visit Diagnoses       BMI 36.0-36.9,adult         Class 2 obesity without serious comorbidity with body mass index (BMI) of 36.0 to 36.9 in adult, unspecified obesity type           Vitals Temp: 97.7 F (36.5 C) BP: 114/72 Pulse Rate: 62 SpO2: 99 %   Anthropometric Measurements Height: 5' 2.5 (1.588 m) Weight: 203 lb (92.1 kg) BMI (Calculated): 36.51 Weight at Last Visit: 205 lb Weight Lost Since Last Visit: 2 Weight Gained Since Last Visit: 0 Starting Weight: 191 lb Total Weight Loss (lbs): 0 lb (0 kg)   Body Composition  Body Fat %: 47.7 % Fat Mass (lbs): 97.2 lbs Muscle Mass (lbs): 101 lbs Total Body Water (lbs): 77.6 lbs Visceral Fat Rating : 14   Other Clinical Data Today's Visit #: 64 Starting Date: 05/15/19 Comments: 1150-1250/85     ASSESSMENT AND PLAN: Assessment & Plan Prediabetes Started on metformin  since last  appointment.  She feels this is helping with the frequency of her bowl movements since starting.  No GI side effects mentioned. Continue metformin  and no refill needed. OSA (obstructive sleep apnea) Insurance does not cover Zepbound  currently for treatment of OSA.  May want to revisit coverage in January to see if insurance covers it.  BMI 36.0-36.9,adult  Class 2 obesity without serious comorbidity with body mass index (BMI) of 36.0 to 36.9 in adult, unspecified obesity type    Diet: Heather Hayes is currently in the action stage of change. As such, her goal is to continue with weight loss efforts and has agreed to keeping a food journal and adhering to recommended goals of 1150-1250 calories and 85 or more grams of protein daily.   Exercise:  For substantial health benefits, adults should do at least 150 minutes (2 hours and 30 minutes) a week of moderate-intensity, or 75 minutes (1 hour and 15 minutes) a week of vigorous-intensity aerobic physical activity, or an equivalent combination of moderate- and vigorous-intensity aerobic activity. Aerobic activity should be performed in episodes of at least 10 minutes, and preferably, it should be spread throughout the week.  Behavior Modification:  We discussed the following Behavioral Modification Strategies today: increasing lean protein intake, holiday eating strategies, celebration eating strategies, planning for success, and keep a strict food journal.   Return in about 4  weeks (around 08/06/2024).   She was informed of the importance of frequent follow up visits to maximize her success with intensive lifestyle modifications for her multiple health conditions.  Attestation Statements:   Reviewed by clinician on day of visit: allergies, medications, problem list, medical history, surgical history, family history, social history, and previous encounter notes.   Heather Cho, MD

## 2024-08-16 ENCOUNTER — Ambulatory Visit (INDEPENDENT_AMBULATORY_CARE_PROVIDER_SITE_OTHER): Admitting: Family Medicine

## 2024-09-05 ENCOUNTER — Other Ambulatory Visit (HOSPITAL_COMMUNITY): Payer: Self-pay

## 2024-09-05 ENCOUNTER — Ambulatory Visit (INDEPENDENT_AMBULATORY_CARE_PROVIDER_SITE_OTHER): Admitting: Family Medicine

## 2024-09-05 ENCOUNTER — Encounter (INDEPENDENT_AMBULATORY_CARE_PROVIDER_SITE_OTHER): Payer: Self-pay | Admitting: Family Medicine

## 2024-09-05 VITALS — BP 126/75 | HR 71 | Temp 98.3°F | Ht 62.5 in | Wt 204.0 lb

## 2024-09-05 DIAGNOSIS — E669 Obesity, unspecified: Secondary | ICD-10-CM | POA: Diagnosis not present

## 2024-09-05 DIAGNOSIS — Z6836 Body mass index (BMI) 36.0-36.9, adult: Secondary | ICD-10-CM

## 2024-09-05 DIAGNOSIS — R7303 Prediabetes: Secondary | ICD-10-CM | POA: Diagnosis not present

## 2024-09-05 MED ORDER — METFORMIN HCL 500 MG PO TABS: 500.0000 mg | ORAL_TABLET | Freq: Every day | ORAL | 0 refills | Status: AC

## 2024-09-05 MED ORDER — WEGOVY 1.5 MG PO TABS
1.5000 mg | ORAL_TABLET | Freq: Every day | ORAL | 0 refills | Status: AC
  Filled 2024-09-05 (×2): qty 30, 30d supply, fill #0

## 2024-09-05 NOTE — Progress Notes (Signed)
 "  Office: [phone]  /  Fax: [phone]  WEIGHT SUMMARY AND BIOMETRICS  Anthropometric Measurements Height: 5' 2.5 (1.588 m) Weight: 204 lb (92.5 kg) BMI (Calculated): 36.69 Weight at Last Visit: 203 lb Weight Lost Since Last Visit: 0 Weight Gained Since Last Visit: 1 lb Starting Weight: 191 lb Total Weight Loss (lbs): 0 lb (0 kg)   Body Composition  Body Fat %: 48 % Fat Mass (lbs): 98.2 lbs Muscle Mass (lbs): 100.8 lbs Total Body Water (lbs): 77.8 lbs Visceral Fat Rating : 15   Other Clinical Data Fasting: no Labs: no Today's Visit #: 30 Starting Date: 05/15/19    Chief Complaint: OBESITY    History of Present Illness Heather Hayes is a 64 year old female with obesity and prediabetes who presents for obesity treatment and progress assessment.  She is adhering to the category two eating plan approximately sixty percent of the time, focusing on increasing her intake of fruits and vegetables but not meeting her recommended protein intake. She exercises for fifteen minutes three days a week on a stationary bike. Despite these efforts, she has gained one pound over the last two months, including the holiday period.  She is managing prediabetes by reducing simple carbohydrates and eating more whole foods. She is currently taking metformin  500 mg daily and requests a refill.  She underwent three surgeries within the past year for a perirectal abscess and an anal fistula, including a fistulaectomy at the end of October. She experiences significant itching at the incision site since around Christmas, which she manages with over-the-counter cortisone cream. She also experiences some leakage and burning during urination, which she attributes to the healing process.  She completed her course of anastrozole  in August, after which she noticed a resurgence of hot flashes. She describes significant fluid retention, noting swelling in her fingers and legs.  Her social history  includes a stressful work environment where her job responsibilities have increased without adequate training, contributing to stress eating. She mentions having a supportive mentor in [place]  who assists her with her new job responsibilities.      PHYSICAL EXAM:  Blood pressure 126/75, pulse 71, temperature 98.3 F (36.8 C), height 5' 2.5 (1.588 m), weight 204 lb (92.5 kg), SpO2 97%. Body mass index is 36.72 kg/m.  DIAGNOSTIC DATA REVIEWED BY MYSELF TODAY:  BMET    Component Value Date/Time   NA 142 05/23/2024 1020   K 4.1 05/23/2024 1020   CL 102 05/23/2024 1020   CO2 22 05/23/2024 1020   GLUCOSE 87 05/23/2024 1020   GLUCOSE 92 05/14/2024 0855   BUN 12 05/23/2024 1020   CREATININE 0.61 05/23/2024 1020   CREATININE 0.76 10/27/2023 0852   CALCIUM 9.5 05/23/2024 1020   GFRNONAA >60 05/14/2024 0855   GFRNONAA >60 10/27/2023 0852   GFRAA 106 05/20/2020 0930   GFRAA >60 04/25/2020 0920   Lab Results  Component Value Date   HGBA1C 5.7 (H) 05/23/2024   HGBA1C 5.4 05/15/2019   Lab Results  Component Value Date   INSULIN  13.4 05/23/2024   INSULIN  14.9 05/15/2019   Lab Results  Component Value Date   TSH 1.320 05/23/2024   CBC    Component Value Date/Time   WBC 6.0 05/14/2024 0855   RBC 4.32 05/14/2024 0855   HGB 13.0 05/14/2024 0855   HGB 12.6 10/27/2023 0852   HGB 12.6 07/04/2023 0829   HCT 41.1 05/14/2024 0855   HCT 39.3 07/04/2023 0829   PLT 257 05/14/2024 0855  PLT 241 10/27/2023 0852   PLT 322 07/04/2023 0829   MCV 95.1 05/14/2024 0855   MCV 97 07/04/2023 0829   MCH 30.1 05/14/2024 0855   MCHC 31.6 05/14/2024 0855   RDW 12.7 05/14/2024 0855   RDW 13.6 07/04/2023 0829   Iron Studies No results found for: IRON, TIBC, FERRITIN, IRONPCTSAT Lipid Panel     Component Value Date/Time   CHOL 227 (H) 05/23/2024 1020   TRIG 124 05/23/2024 1020   HDL 68 05/23/2024 1020   CHOLHDL 3.3 05/23/2024 1020   LDLCALC 137 (H) 05/23/2024 1020   Hepatic  Function Panel     Component Value Date/Time   PROT 7.1 05/23/2024 1020   ALBUMIN 4.3 05/23/2024 1020   AST 29 05/23/2024 1020   AST 15 10/27/2023 0852   ALT 31 05/23/2024 1020   ALT 13 10/27/2023 0852   ALKPHOS 130 05/23/2024 1020   BILITOT 0.4 05/23/2024 1020   BILITOT 0.5 10/27/2023 0852      Component Value Date/Time   TSH 1.320 05/23/2024 1020   Nutritional Lab Results  Component Value Date   VD25OH 42.1 05/23/2024   VD25OH 39.6 07/04/2023   VD25OH 81.0 11/09/2021     Assessment and Plan Assessment & Plan Obesity Management is ongoing with a focus on dietary modifications and exercise. She follows the category two eating plan 60% of the time, is increasing fruit and vegetable intake, but is not meeting protein recommendations, potentially affecting resting metabolic rate and muscle mass. She exercises 15 minutes three times a week on a stationary bike. There is a weight gain of one pound over the last two months, attributed to water retention, possibly due to fluid retention and dietary factors. She is interested in Wegovy  for weight management, with a savings card offering reduced costs. The Wegovy  pill is preferred over the injection due to cost and convenience. Timing of the pill is critical for absorption and effectiveness. - Prescribed Wegovy  pill to be taken on an empty stomach with a small amount of water, followed by a 30-minute wait before eating. - Continue category two eating plan with emphasis on high protein intake. - Monitor weight and fluid retention. - Scheduled follow-up appointment in March or April to assess progress and side effects of Wegovy .  Prediabetes Management includes dietary modifications to reduce simple carbohydrates and increase whole foods. She is on metformin  500 mg daily and requests a refill. The importance of maintaining both medications is emphasized. - Refilled metformin  500 mg daily. - Continue dietary modifications to reduce simple  carbohydrates and increase whole foods.      Patients who are on anti-obesity medications are counseled on the importance of maintaining healthy lifestyle habits, including balanced nutrition, regular physical activity, and behavioral modifications,  Medication is an adjunct to, not a replacement for, lifestyle changes and that the long-term success and weight maintenance depend on continued adherence to these strategies.   Sherrye was informed of the importance of frequent follow up visits to maximize her success with intensive lifestyle modifications for her obesity and obesity related health conditions as recommended by USPSTF and CMS guidelines  Louann Penton, MD   "

## 2024-10-04 ENCOUNTER — Ambulatory Visit (INDEPENDENT_AMBULATORY_CARE_PROVIDER_SITE_OTHER): Admitting: Family Medicine

## 2024-11-06 ENCOUNTER — Ambulatory Visit (INDEPENDENT_AMBULATORY_CARE_PROVIDER_SITE_OTHER): Admitting: Family Medicine
